# Patient Record
Sex: Male | Born: 1940 | Race: White | Hispanic: No | Marital: Married | Smoking: Current every day smoker
Health system: Southern US, Community
[De-identification: ages and names within clinical notes are randomized; demographics above are authoritative.]

## PROBLEM LIST (undated history)

## (undated) DIAGNOSIS — C679 Malignant neoplasm of bladder, unspecified: Secondary | ICD-10-CM

## (undated) DIAGNOSIS — T4145XA Adverse effect of unspecified anesthetic, initial encounter: Secondary | ICD-10-CM

## (undated) DIAGNOSIS — J189 Pneumonia, unspecified organism: Secondary | ICD-10-CM

## (undated) DIAGNOSIS — T8859XA Other complications of anesthesia, initial encounter: Secondary | ICD-10-CM

## (undated) DIAGNOSIS — I10 Essential (primary) hypertension: Secondary | ICD-10-CM

## (undated) DIAGNOSIS — J449 Chronic obstructive pulmonary disease, unspecified: Secondary | ICD-10-CM

## (undated) DIAGNOSIS — Z9889 Other specified postprocedural states: Secondary | ICD-10-CM

## (undated) DIAGNOSIS — I2510 Atherosclerotic heart disease of native coronary artery without angina pectoris: Secondary | ICD-10-CM

## (undated) DIAGNOSIS — E119 Type 2 diabetes mellitus without complications: Secondary | ICD-10-CM

## (undated) DIAGNOSIS — M169 Osteoarthritis of hip, unspecified: Secondary | ICD-10-CM

## (undated) DIAGNOSIS — M1990 Unspecified osteoarthritis, unspecified site: Secondary | ICD-10-CM

## (undated) DIAGNOSIS — Y92009 Unspecified place in unspecified non-institutional (private) residence as the place of occurrence of the external cause: Secondary | ICD-10-CM

## (undated) DIAGNOSIS — R011 Cardiac murmur, unspecified: Secondary | ICD-10-CM

## (undated) DIAGNOSIS — D649 Anemia, unspecified: Secondary | ICD-10-CM

## (undated) DIAGNOSIS — I509 Heart failure, unspecified: Secondary | ICD-10-CM

## (undated) DIAGNOSIS — I739 Peripheral vascular disease, unspecified: Secondary | ICD-10-CM

## (undated) DIAGNOSIS — R112 Nausea with vomiting, unspecified: Secondary | ICD-10-CM

## (undated) DIAGNOSIS — E785 Hyperlipidemia, unspecified: Secondary | ICD-10-CM

## (undated) DIAGNOSIS — R0602 Shortness of breath: Secondary | ICD-10-CM

## (undated) DIAGNOSIS — C4490 Unspecified malignant neoplasm of skin, unspecified: Secondary | ICD-10-CM

## (undated) DIAGNOSIS — W19XXXA Unspecified fall, initial encounter: Secondary | ICD-10-CM

## (undated) DIAGNOSIS — Z5189 Encounter for other specified aftercare: Secondary | ICD-10-CM

## (undated) DIAGNOSIS — H269 Unspecified cataract: Secondary | ICD-10-CM

## (undated) DIAGNOSIS — I6529 Occlusion and stenosis of unspecified carotid artery: Secondary | ICD-10-CM

## (undated) DIAGNOSIS — I4891 Unspecified atrial fibrillation: Secondary | ICD-10-CM

## (undated) DIAGNOSIS — K922 Gastrointestinal hemorrhage, unspecified: Secondary | ICD-10-CM

## (undated) DIAGNOSIS — C649 Malignant neoplasm of unspecified kidney, except renal pelvis: Secondary | ICD-10-CM

## (undated) HISTORY — DX: Peripheral vascular disease, unspecified: I73.9

## (undated) HISTORY — DX: Malignant neoplasm of bladder, unspecified: C67.9

## (undated) HISTORY — PX: BYPASS GRAFT: SHX909

## (undated) HISTORY — DX: Osteoarthritis of hip, unspecified: M16.9

## (undated) HISTORY — DX: Unspecified malignant neoplasm of skin, unspecified: C44.90

## (undated) HISTORY — DX: Malignant neoplasm of unspecified kidney, except renal pelvis: C64.9

## (undated) HISTORY — PX: CORONARY ARTERY BYPASS GRAFT: SHX141

## (undated) HISTORY — DX: Gastrointestinal hemorrhage, unspecified: K92.2

## (undated) HISTORY — PX: TONSILLECTOMY: SUR1361

## (undated) HISTORY — PX: EYE SURGERY: SHX253

## (undated) HISTORY — DX: Unspecified atrial fibrillation: I48.91

## (undated) HISTORY — DX: Unspecified fall, initial encounter: W19.XXXA

## (undated) HISTORY — DX: Occlusion and stenosis of unspecified carotid artery: I65.29

## (undated) HISTORY — DX: Hyperlipidemia, unspecified: E78.5

## (undated) HISTORY — DX: Essential (primary) hypertension: I10

## (undated) HISTORY — DX: Chronic obstructive pulmonary disease, unspecified: J44.9

## (undated) HISTORY — DX: Unspecified place in unspecified non-institutional (private) residence as the place of occurrence of the external cause: Y92.009

## (undated) HISTORY — PX: JOINT REPLACEMENT: SHX530

## (undated) HISTORY — DX: Atherosclerotic heart disease of native coronary artery without angina pectoris: I25.10

---

## 2004-09-19 ENCOUNTER — Ambulatory Visit: Payer: Self-pay

## 2005-03-26 ENCOUNTER — Ambulatory Visit: Payer: Self-pay | Admitting: Gastroenterology

## 2007-11-09 ENCOUNTER — Ambulatory Visit: Payer: Self-pay | Admitting: Internal Medicine

## 2007-11-11 HISTORY — PX: CARDIAC CATHETERIZATION: SHX172

## 2007-12-17 ENCOUNTER — Ambulatory Visit: Payer: Self-pay | Admitting: Internal Medicine

## 2008-01-04 ENCOUNTER — Inpatient Hospital Stay (HOSPITAL_COMMUNITY): Admission: AD | Admit: 2008-01-04 | Discharge: 2008-01-10 | Payer: Self-pay | Admitting: Cardiovascular Disease

## 2008-01-04 ENCOUNTER — Ambulatory Visit: Payer: Self-pay | Admitting: Critical Care Medicine

## 2008-01-07 ENCOUNTER — Ambulatory Visit: Payer: Self-pay | Admitting: Gastroenterology

## 2008-01-18 ENCOUNTER — Telehealth (INDEPENDENT_AMBULATORY_CARE_PROVIDER_SITE_OTHER): Payer: Self-pay | Admitting: *Deleted

## 2008-02-11 DIAGNOSIS — I739 Peripheral vascular disease, unspecified: Secondary | ICD-10-CM | POA: Insufficient documentation

## 2008-02-11 DIAGNOSIS — E785 Hyperlipidemia, unspecified: Secondary | ICD-10-CM

## 2008-02-11 DIAGNOSIS — J4489 Other specified chronic obstructive pulmonary disease: Secondary | ICD-10-CM | POA: Insufficient documentation

## 2008-02-11 DIAGNOSIS — I4891 Unspecified atrial fibrillation: Secondary | ICD-10-CM | POA: Insufficient documentation

## 2008-02-11 DIAGNOSIS — I2510 Atherosclerotic heart disease of native coronary artery without angina pectoris: Secondary | ICD-10-CM

## 2008-02-11 DIAGNOSIS — J449 Chronic obstructive pulmonary disease, unspecified: Secondary | ICD-10-CM | POA: Insufficient documentation

## 2008-02-11 DIAGNOSIS — I10 Essential (primary) hypertension: Secondary | ICD-10-CM | POA: Insufficient documentation

## 2008-02-11 DIAGNOSIS — D649 Anemia, unspecified: Secondary | ICD-10-CM

## 2008-02-14 ENCOUNTER — Ambulatory Visit: Payer: Self-pay | Admitting: Critical Care Medicine

## 2008-02-16 ENCOUNTER — Ambulatory Visit: Payer: Self-pay | Admitting: Gastroenterology

## 2008-02-17 ENCOUNTER — Ambulatory Visit (HOSPITAL_COMMUNITY): Admission: RE | Admit: 2008-02-17 | Discharge: 2008-02-17 | Payer: Self-pay | Admitting: Gastroenterology

## 2008-02-17 ENCOUNTER — Encounter: Payer: Self-pay | Admitting: Gastroenterology

## 2008-02-22 ENCOUNTER — Telehealth (INDEPENDENT_AMBULATORY_CARE_PROVIDER_SITE_OTHER): Payer: Self-pay | Admitting: *Deleted

## 2008-02-22 ENCOUNTER — Encounter: Payer: Self-pay | Admitting: Critical Care Medicine

## 2008-02-24 ENCOUNTER — Encounter: Payer: Self-pay | Admitting: Critical Care Medicine

## 2008-02-24 ENCOUNTER — Ambulatory Visit: Payer: Self-pay | Admitting: Gastroenterology

## 2008-03-13 ENCOUNTER — Telehealth (INDEPENDENT_AMBULATORY_CARE_PROVIDER_SITE_OTHER): Payer: Self-pay | Admitting: *Deleted

## 2008-03-13 ENCOUNTER — Ambulatory Visit (HOSPITAL_COMMUNITY): Admission: RE | Admit: 2008-03-13 | Discharge: 2008-03-13 | Payer: Self-pay | Admitting: Cardiovascular Disease

## 2008-03-14 ENCOUNTER — Ambulatory Visit: Payer: Self-pay | Admitting: Critical Care Medicine

## 2008-03-14 DIAGNOSIS — J309 Allergic rhinitis, unspecified: Secondary | ICD-10-CM | POA: Insufficient documentation

## 2008-03-24 ENCOUNTER — Telehealth (INDEPENDENT_AMBULATORY_CARE_PROVIDER_SITE_OTHER): Payer: Self-pay | Admitting: *Deleted

## 2008-03-30 ENCOUNTER — Ambulatory Visit: Payer: Self-pay | Admitting: Gastroenterology

## 2008-04-05 ENCOUNTER — Encounter: Payer: Self-pay | Admitting: Gastroenterology

## 2008-04-05 ENCOUNTER — Ambulatory Visit: Payer: Self-pay | Admitting: Gastroenterology

## 2008-04-05 LAB — CONVERTED CEMR LAB
INR: 1.1 — ABNORMAL HIGH (ref 0.8–1.0)
Prothrombin Time: 13.1 s (ref 10.9–13.3)

## 2008-04-07 ENCOUNTER — Encounter: Payer: Self-pay | Admitting: Gastroenterology

## 2008-04-21 ENCOUNTER — Ambulatory Visit: Payer: Self-pay | Admitting: Internal Medicine

## 2008-05-01 LAB — CONVERTED CEMR LAB
OCCULT 1: NEGATIVE
OCCULT 2: NEGATIVE
OCCULT 3: NEGATIVE

## 2008-05-29 ENCOUNTER — Ambulatory Visit: Payer: Self-pay | Admitting: Critical Care Medicine

## 2008-07-18 ENCOUNTER — Telehealth: Payer: Self-pay | Admitting: Critical Care Medicine

## 2008-07-25 ENCOUNTER — Encounter: Payer: Self-pay | Admitting: Critical Care Medicine

## 2008-09-19 ENCOUNTER — Ambulatory Visit: Payer: Self-pay | Admitting: Critical Care Medicine

## 2008-10-12 ENCOUNTER — Ambulatory Visit: Payer: Self-pay | Admitting: Internal Medicine

## 2008-10-12 ENCOUNTER — Telehealth (INDEPENDENT_AMBULATORY_CARE_PROVIDER_SITE_OTHER): Payer: Self-pay | Admitting: *Deleted

## 2008-10-17 ENCOUNTER — Ambulatory Visit: Payer: Self-pay | Admitting: Pulmonary Disease

## 2009-02-08 ENCOUNTER — Ambulatory Visit: Payer: Self-pay | Admitting: Internal Medicine

## 2009-02-20 ENCOUNTER — Ambulatory Visit: Payer: Self-pay | Admitting: Critical Care Medicine

## 2009-03-05 ENCOUNTER — Telehealth: Payer: Self-pay | Admitting: Critical Care Medicine

## 2009-03-12 ENCOUNTER — Ambulatory Visit: Payer: Self-pay | Admitting: Internal Medicine

## 2009-03-20 ENCOUNTER — Ambulatory Visit: Payer: Self-pay | Admitting: Critical Care Medicine

## 2009-03-30 ENCOUNTER — Ambulatory Visit: Payer: Self-pay | Admitting: Critical Care Medicine

## 2009-04-11 ENCOUNTER — Telehealth (INDEPENDENT_AMBULATORY_CARE_PROVIDER_SITE_OTHER): Payer: Self-pay | Admitting: *Deleted

## 2009-05-16 ENCOUNTER — Ambulatory Visit: Payer: Self-pay | Admitting: Critical Care Medicine

## 2009-07-10 ENCOUNTER — Telehealth: Payer: Self-pay | Admitting: Critical Care Medicine

## 2009-07-11 ENCOUNTER — Ambulatory Visit: Payer: Self-pay | Admitting: Critical Care Medicine

## 2009-07-17 ENCOUNTER — Ambulatory Visit: Payer: Self-pay | Admitting: Critical Care Medicine

## 2009-10-18 ENCOUNTER — Ambulatory Visit: Payer: Self-pay | Admitting: Critical Care Medicine

## 2009-10-31 ENCOUNTER — Ambulatory Visit: Payer: Self-pay | Admitting: Critical Care Medicine

## 2009-11-12 ENCOUNTER — Ambulatory Visit: Payer: Self-pay | Admitting: Critical Care Medicine

## 2010-01-10 ENCOUNTER — Telehealth (INDEPENDENT_AMBULATORY_CARE_PROVIDER_SITE_OTHER): Payer: Self-pay | Admitting: *Deleted

## 2010-01-10 ENCOUNTER — Ambulatory Visit: Payer: Self-pay | Admitting: Critical Care Medicine

## 2010-01-29 ENCOUNTER — Encounter: Payer: Self-pay | Admitting: Critical Care Medicine

## 2010-02-12 ENCOUNTER — Telehealth: Payer: Self-pay | Admitting: Critical Care Medicine

## 2010-02-13 ENCOUNTER — Ambulatory Visit: Payer: Self-pay | Admitting: Critical Care Medicine

## 2010-02-28 ENCOUNTER — Ambulatory Visit: Payer: Self-pay | Admitting: Critical Care Medicine

## 2010-03-12 ENCOUNTER — Encounter: Payer: Self-pay | Admitting: Critical Care Medicine

## 2010-04-05 ENCOUNTER — Ambulatory Visit: Payer: Self-pay | Admitting: Critical Care Medicine

## 2010-07-10 ENCOUNTER — Ambulatory Visit: Payer: Self-pay | Admitting: Critical Care Medicine

## 2010-10-11 ENCOUNTER — Ambulatory Visit: Payer: Self-pay | Admitting: Critical Care Medicine

## 2010-11-22 ENCOUNTER — Ambulatory Visit
Admission: RE | Admit: 2010-11-22 | Discharge: 2010-11-22 | Payer: Self-pay | Source: Home / Self Care | Attending: Critical Care Medicine | Admitting: Critical Care Medicine

## 2010-12-10 ENCOUNTER — Ambulatory Visit: Admit: 2010-12-10 | Payer: Self-pay | Admitting: Critical Care Medicine

## 2010-12-10 NOTE — Assessment & Plan Note (Signed)
Summary: Acute NP office visit - COPD   Primary Provider/Referring Provider:  Dr. Alonna Buckler  CC:  prod cough with clear mucus, wheezing, DOE, and runny nose x3-4days - denies f/c/s.  History of Present Illness: 70   yowm  with COPD/AB   still smoking occasionally.  Aug 31: No new issues, not smoking.  Pt denies any significant sore throat, nasal congestion or excess secretions, fever, chills, sweats, unintended weight loss, pleurtic or exertional chest pain, orthopnea PND, or leg swelling Pt denies any increase in rescue therapy over baseline, denies waking up needing it or having any early am or nocturnal exacerbations of coughing/wheezing/or dyspnea.   October 11, 2010 --Presents for an acute office visit. Complains of cough with clear mucus, wheezing, DOE, runny nose x3-4days,. OTC not helping Denies chest pain,  orthopnea, hemoptysis, fever, n/v/d, edema, headache,  Medications Prior to Update: 1)  Spiriva Handihaler 18 Mcg  Caps (Tiotropium Bromide Monohydrate) .... Inhale Contents of 1 Capsule Once A Day 2)  Coumadin 1 Mg  Tabs (Warfarin Sodium) .... Take 1 Tablet By Mouth Once A Day 3)  Simvastatin 20 Mg Tabs (Simvastatin) .... 1/2 By Mouth Daily At Bedtime 4)  Omeprazole 20 Mg Cpdr (Omeprazole) .... 2 By Mouth 30 Minutes Before The First Meal of The Day 5)  Allopurinol 300 Mg  Tabs (Allopurinol) .... 1/2 Tab By Mouth Daily 6)  Amiodarone Hcl 200 Mg Tabs (Amiodarone Hcl) .... 1/2 Tab By Mouth Once Daily 7)  Furosemide 40 Mg  Tabs (Furosemide) .... Take 1/2 Tablet By Mouth  Once Daily 8)  Cozaar 50 Mg Tabs (Losartan Potassium) .... Take 1 Tablet By Mouth Once A Day 9)  Diltiazem Hcl Cr 120 Mg Xr24h-Cap (Diltiazem Hcl) .... Take 1 Capsule By Mouth Once A Day 10)  Adult Aspirin Ec Low Strength 81 Mg  Tbec (Aspirin) .... Take 1 Tab By Mouth At Bedtime 11)  Feosol 45 Mg Tabs (Iron) .... Take 1 Tablet By Mouth Twice A Day 12)  Bystolic 5 Mg  Tabs (Nebivolol Hcl) .... One Tablet  Daily 13)  Albuterol Sulfate (2.5 Mg/18ml) 0.083% Nebu (Albuterol Sulfate) .... Four Times Daily or Every 6 Hours As Needed 14)  Oxygen .... 2.5 L At Bedtime and 3l When Walking  Current Medications (verified): 1)  Spiriva Handihaler 18 Mcg  Caps (Tiotropium Bromide Monohydrate) .... Inhale Contents of 1 Capsule Once A Day 2)  Coumadin 1 Mg  Tabs (Warfarin Sodium) .... Take 1 Tablet By Mouth Once A Day 3)  Simvastatin 20 Mg Tabs (Simvastatin) .... 1/2 By Mouth Daily At Bedtime 4)  Omeprazole 20 Mg Cpdr (Omeprazole) .... 2 By Mouth 30 Minutes Before The First Meal of The Day 5)  Allopurinol 300 Mg  Tabs (Allopurinol) .... 1/2 Tab By Mouth Daily 6)  Amiodarone Hcl 200 Mg Tabs (Amiodarone Hcl) .... 1/2 Tab By Mouth Once Daily 7)  Furosemide 40 Mg  Tabs (Furosemide) .... Take 1/2 Tablet By Mouth  Once Daily 8)  Cozaar 50 Mg Tabs (Losartan Potassium) .... Take 1 Tablet By Mouth Once A Day 9)  Diltiazem Hcl Cr 120 Mg Xr24h-Cap (Diltiazem Hcl) .... Take 1 Capsule By Mouth Once A Day 10)  Adult Aspirin Ec Low Strength 81 Mg  Tbec (Aspirin) .... Take 1 Tab By Mouth At Bedtime 11)  Feosol 45 Mg Tabs (Iron) .... Take 1 Tablet By Mouth Twice A Day 12)  Bystolic 5 Mg  Tabs (Nebivolol Hcl) .... One Tablet Daily 13)  Albuterol  Sulfate (2.5 Mg/55ml) 0.083% Nebu (Albuterol Sulfate) .... Four Times Daily or Every 6 Hours As Needed 14)  Oxygen .... 2.5 L At Bedtime and 3l When Walking 15)  Asmanex 60 Metered Doses 220 Mcg/inh Aepb (Mometasone Furoate) .... Inhale 1 Puff Two Times A Day  Allergies (verified): No Known Drug Allergies  Past History:  Past Medical History: Last updated: 05/29/2008 Atrial Fibrillation  converted to NSR 5/09 on pacerone Coronary Heart Disease Hyperlipidemia Hypertension COPD PVD  Family History: Last updated: 10/18/2009 emphysema - father  heart disease - father cancer - mother (unknown) TB - PGF  Social History: Last updated: 10/18/2009 Active smoker - 4-12cigs a  day drinks 2 beers daily married 3 children retired - worked in a print shop x30yrs  Risk Factors: Smoking Status: current (07/10/2010) Packs/Day: <0.25 (07/10/2010)   Review of Systems      See HPI  Vital Signs:  Patient profile:   70 year old male Height:      64 inches Weight:      154.50 pounds BMI:     26.62 O2 Sat:      92 % on Room air Temp:     98.6 degrees F oral Pulse rate:   72 / minute BP sitting:   104 / 64  (left arm) Cuff size:   regular  Vitals Entered By: Boone Master CNA/MA (October 11, 2010 2:41 PM)  O2 Flow:  Room air CC: prod cough with clear mucus, wheezing, DOE, runny nose x3-4days - denies f/c/s Is Patient Diabetic? No Comments Medications reviewed with patient Daytime contact number verified with patient. Boone Master CNA/MA  October 11, 2010 2:38 PM    Physical Exam  Additional Exam:  GEN: A/Ox3; pleasant , NAD HEENT:  [place]/AT, , EACs-clear, TMs-wnl, NOSE-clear, THROAT-clear NECK:  Supple w/ fair ROM; no JVD; normal carotid impulses w/o bruits; no thyromegaly or nodules palpated; no lymphadenopathy. RESP  Coarse BS , diminshed in bases.,  CARD:  RRR, no m/r/g   GI:   Soft & nt; nml bowel sounds; no organomegaly or masses detected. Musco: Warm bil,  no calf tenderness edema, clubbing, pulses intact Neuro: intact w/ no focal deficits noted.    Impression & Recommendations:  Problem # 1:  CHRONIC OBSTRUCTIVE PULMONARY DISEASE, SEVERE (ICD-496)  Flare :   Omnicef 300mg  two times a day for 7 days Mucinex DM two times a day as needed cough /congestion Prednisone taper over next week Please contact office for sooner follow up if symptoms do not improve or worsen  follow up Dr. Delford Field in 6-8 weeks     Orders: Est. Patient Level IV (47829)  Medications Added to Medication List This Visit: 1)  Asmanex 60 Metered Doses 220 Mcg/inh Aepb (Mometasone furoate) .... Inhale 1 puff two times a day 2)  Cefdinir 300 Mg Caps (Cefdinir) .Marland Kitchen.. 1  by mouth two times a day 3)  Prednisone 10 Mg Tabs (Prednisone) .... 4 tabs for 2 days, then 3 tabs for 2 days, 2 tabs for 2 days, then 1 tab for 2 days, then stop  Patient Instructions: 1)  Omnicef 300mg  two times a day for 7 days 2)  Mucinex DM two times a day as needed cough /congestion 3)  Prednisone taper over next week 4)  Please contact office for sooner follow up if symptoms do not improve or worsen  5)  follow up Dr. Delford Field in 6-8 weeks  6)  WORK ON NOT SMOKING, THAT WOULD BE A  GREAT NEW YEARS RESOLUTION Prescriptions: PREDNISONE 10 MG TABS (PREDNISONE) 4 tabs for 2 days, then 3 tabs for 2 days, 2 tabs for 2 days, then 1 tab for 2 days, then stop  #20 x 0   Entered and Authorized by:   Rubye Oaks NP   Signed by:   Tammy Parrett NP on 10/11/2010   Method used:   Electronically to        [facility]  [address] [phone].* (retail)       [address]       [place], [place]  [address]       Ph: [phone] or [phone]       Fax: [phone]   RxID:   [id number] CEFDINIR 300 MG CAPS (CEFDINIR) 1 by mouth two times a day  #14 x 0   Entered and Authorized by:   Rubye Oaks NP   Signed by:   Tammy Parrett NP on 10/11/2010   Method used:   Electronically to        [facility]  [address] [phone].* (retail)       [address]       [place], [place]  [address]       Ph: [phone] or [phone]       Fax: [phone]   RxID:   [id number]    Immunization History:  Influenza Immunization History:    Influenza:  historical (08/10/2010)

## 2010-12-10 NOTE — Progress Notes (Signed)
Summary: speak to nurse  Phone Note Call from Patient Call back at Home Phone [phone]   Caller: Patient Call For: Avan Gullett Summary of Call: Pt says he has a cold and PW told him to call when this happens so he can take care of it, please advise. Initial call taken by: Darletta Moll,  February 12, 2010 10:04 AM  Follow-up for Phone Call        called and spoke with pt.  pt c/o "difficulty breathing" increased sob with exertion and at rest, feeling weak and tired, head congestion, coughing up "milky colored sputum,"  Pt denied fever or sore throat.  Symptoms started approx 3 to 4 days ago.  Please advise.  Thank you. Arman Filter LPN  February 13, 980 11:38 AM  [facility] [place]  Additional Follow-up for Phone Call Additional follow up Details #1::        Pt will need OV asap Additional Follow-up by: Storm Frisk MD,  February 12, 2010 1:39 PM    Additional Follow-up for Phone Call Additional follow up Details #2::    pt wanted to wait till tomorrow, so he was schedueld to see PW 02-13-10 at 3:20. Carron Curie CMA  February 12, 2010 2:53 PM

## 2010-12-10 NOTE — Assessment & Plan Note (Signed)
Summary: NP follow up - COPD   Primary Provider/Referring Provider:  Dr. Alonna Buckler  CC:  2 week follow up - pt states he doing "great" and has no complaints today.Marland Kitchen  History of Present Illness: 62  yowm  with COPD/AB   still smoking occasionally.  July 17, 2009 --Last visit flare -- getting hoarse and cough np.  rx prednisone and this helped The mucous is clear, not discolored.The dyspnea and cough are now better.  There is no chest pain. The pt is still smoking 1-3 cigs daily.this pt continues to not take his spiriva/asmanex program regularly  October 18, 2009--Presents for an acute office visit. Complains of prod cough with thick milk-colored mucus, head congestion, PND x2days.  Nasal congestion and drainage white/clear. Denies chest pain,  orthopnea, hemoptysis, fever, n/v/d, edema, headache,recent travel or antibiotics. No otc used. Continues on nocturnal O2. No fluid retention.   October 31, 2009 12:05 PM saw NP 12/9  for AB flare and rx doxycycline now: the ABX helped at the last ov. Still occ smokes  Now is coughing mucous but is clear.  Some dyspnea but at baseline.  November 12, 2009--Presents for an acute office visit. Complains that his cough/congestion returned for last 3 days. Now he is coughing and wheeizng. No smoking x 2 weeks. No otc used. Denies chest pain, dyspnea, orthopnea, hemoptysis, fever, n/v/d, edema, headache. No abd pain or urinary symptoms.   January 10, 2010 --Presents for an acute office visit. Pt c/o lethargic, loss of appetite, wheezing, productive cough with "milky" mucus since the weekend.  Has had 1 week of cough and congestion. Still smoking. Denies chest pain, dyspnea, orthopnea, hemoptysis, fever, n/v/d, edema, headache. Did not take abx last visit, was tx w/ steroid taper 11/2009. February 13, 2010 -the pt notes more cough and wheezing past one week.  No f/c/s.  Pt denies chest pain.  Pt with more dyspnea and excess mucous Pt denies  smoking.  02/28/10--Presents for 2 week follow up - pt states he doing "great" and has no complaints today. Last visit w/ COPD flare , tx w/ Suprax and steroid taper.  He is feeling much better. Cough /congesiton is improved. Denies chest pain, dyspnea, orthopnea, hemoptysis, fever, n/v/d, edema, headache.    Medications Prior to Update: 1)  Spiriva Handihaler 18 Mcg  Caps (Tiotropium Bromide Monohydrate) .... Inhale Contents of 1 Capsule Once A Day 2)  Asmanex 60 Metered Doses 220 Mcg/inh Aepb (Mometasone Furoate) .... 2 Puffs By Mouth Once Daily 3)  Coumadin 1 Mg  Tabs (Warfarin Sodium) .... Take 1 Tablet By Mouth Once A Day 4)  Simvastatin 20 Mg Tabs (Simvastatin) .... 1/2 By Mouth Daily At Bedtime 5)  Omeprazole 20 Mg Cpdr (Omeprazole) .... 2 By Mouth 30 Minutes Before The First Meal of The Day 6)  Allopurinol 300 Mg  Tabs (Allopurinol) .... 1/2 Tab By Mouth Daily 7)  Amiodarone Hcl 200 Mg Tabs (Amiodarone Hcl) .... 1/2 Tab By Mouth Once Daily 8)  Furosemide 40 Mg  Tabs (Furosemide) .... Take 1/2 Tablet By Mouth  Once Daily 9)  Cozaar 50 Mg Tabs (Losartan Potassium) .... Take 1 Tablet By Mouth Once A Day 10)  Diltiazem Hcl Cr 120 Mg Xr24h-Cap (Diltiazem Hcl) .... Take 1 Capsule By Mouth Once A Day 11)  Adult Aspirin Ec Low Strength 81 Mg  Tbec (Aspirin) .... Take 1 Tab By Mouth At Bedtime 12)  Feosol 45 Mg Tabs (Iron) .... Take 1  Tablet By Mouth Twice A Day 13)  Bystolic 5 Mg  Tabs (Nebivolol Hcl) .... One Tablet Daily 14)  Albuterol Sulfate (2.5 Mg/12ml) 0.083%  Nebu (Albuterol Sulfate) .... Four Times Daily or Every 6 Hours As Needed 15)  Oxygen .... 2.5 L At Bedtime and As Needed Daytime With Exertion 3l  Current Medications (verified): 1)  Spiriva Handihaler 18 Mcg  Caps (Tiotropium Bromide Monohydrate) .... Inhale Contents of 1 Capsule Once A Day 2)  Asmanex 60 Metered Doses 220 Mcg/inh Aepb (Mometasone Furoate) .... 2 Puffs By Mouth Once Daily 3)  Coumadin 1 Mg  Tabs (Warfarin  Sodium) .... Take 1 Tablet By Mouth Once A Day 4)  Simvastatin 20 Mg Tabs (Simvastatin) .... 1/2 By Mouth Daily At Bedtime 5)  Omeprazole 20 Mg Cpdr (Omeprazole) .... 2 By Mouth 30 Minutes Before The First Meal of The Day 6)  Allopurinol 300 Mg  Tabs (Allopurinol) .... 1/2 Tab By Mouth Daily 7)  Amiodarone Hcl 200 Mg Tabs (Amiodarone Hcl) .... 1/2 Tab By Mouth Once Daily 8)  Furosemide 40 Mg  Tabs (Furosemide) .... Take 1/2 Tablet By Mouth  Once Daily 9)  Cozaar 50 Mg Tabs (Losartan Potassium) .... Take 1 Tablet By Mouth Once A Day 10)  Diltiazem Hcl Cr 120 Mg Xr24h-Cap (Diltiazem Hcl) .... Take 1 Capsule By Mouth Once A Day 11)  Adult Aspirin Ec Low Strength 81 Mg  Tbec (Aspirin) .... Take 1 Tab By Mouth At Bedtime 12)  Feosol 45 Mg Tabs (Iron) .... Take 1 Tablet By Mouth Twice A Day 13)  Bystolic 5 Mg  Tabs (Nebivolol Hcl) .... One Tablet Daily 14)  Albuterol Sulfate (2.5 Mg/21ml) 0.083%  Nebu (Albuterol Sulfate) .... Four Times Daily or Every 6 Hours As Needed 15)  Oxygen .... 2.5 L At Bedtime and As Needed Daytime With Exertion 3l  Allergies (verified): No Known Drug Allergies  Past History:  Past Medical History: Last updated: 05/29/2008 Atrial Fibrillation  converted to NSR 5/09 on pacerone Coronary Heart Disease Hyperlipidemia Hypertension COPD PVD  Family History: Last updated: 10/18/2009 emphysema - father  heart disease - father cancer - mother (unknown) TB - PGF  Social History: Last updated: 10/18/2009 Active smoker - 4-12cigs a day drinks 2 beers daily married 3 children retired - worked in a print shop x21yrs  Risk Factors: Alcohol Use: <1 (03/12/2009)  Risk Factors: Smoking Status: quit < 6 months (02/13/2010) Packs/Day: <0.25 (10/31/2009)  Review of Systems      See HPI  Vital Signs:  Patient profile:   70 year old male Height:      66 inches Weight:      159 pounds BMI:     25.76 O2 Sat:      95 % on Room air Temp:     97.7 degrees F  oral Pulse rate:   68 / minute BP sitting:   110 / 64  (left arm) Cuff size:   regular  Vitals Entered By: Boone Master CNA (February 28, 2010 3:29 PM)  O2 Flow:  Room air CC: 2 week follow up - pt states he doing "great" and has no complaints today. Is Patient Diabetic? No Comments Medications reviewed with patient Daytime contact number verified with patient. Boone Master CNA  February 28, 2010 3:30 PM    Physical Exam  Additional Exam:  GEN: A/Ox3; pleasant , NAD HEENT:  [place]/AT, , EACs-clear, TMs-wnl, NOSE-clear, THROAT-clear NECK:  Supple w/ fair ROM; no JVD; normal carotid impulses w/o  bruits; no thyromegaly or nodules palpated; no lymphadenopathy. RESP  Coarse BS , diminshed in bases.  CARD:  RRR, no m/r/g   GI:   Soft & nt; nml bowel sounds; no organomegaly or masses detected. Musco: Warm bil,  no calf tenderness edema, clubbing, pulses intact Neuro: intact w/ no focal deficits noted.    Impression & Recommendations:  Problem # 1:  CHRONIC OBSTRUCTIVE PULMONARY DISEASE, SEVERE (ICD-496) Recent flare now resolved.  REC:  Continue on same meds.  follow up.Dr. Delford Field in 2-3 months  Please contact office for sooner follow up if symptoms do not improve or worsen   Complete Medication List: 1)  Spiriva Handihaler 18 Mcg Caps (Tiotropium bromide monohydrate) .... Inhale contents of 1 capsule once a day 2)  Asmanex 60 Metered Doses 220 Mcg/inh Aepb (Mometasone furoate) .... 2 puffs by mouth once daily 3)  Coumadin 1 Mg Tabs (Warfarin sodium) .... Take 1 tablet by mouth once a day 4)  Simvastatin 20 Mg Tabs (Simvastatin) .... 1/2 by mouth daily at bedtime 5)  Omeprazole 20 Mg Cpdr (Omeprazole) .... 2 by mouth 30 minutes before the first meal of the day 6)  Allopurinol 300 Mg Tabs (Allopurinol) .... 1/2 tab by mouth daily 7)  Amiodarone Hcl 200 Mg Tabs (Amiodarone hcl) .... 1/2 tab by mouth once daily 8)  Furosemide 40 Mg Tabs (Furosemide) .... Take 1/2 tablet by mouth  once  daily 9)  Cozaar 50 Mg Tabs (Losartan potassium) .... Take 1 tablet by mouth once a day 10)  Diltiazem Hcl Cr 120 Mg Xr24h-cap (Diltiazem hcl) .... Take 1 capsule by mouth once a day 11)  Adult Aspirin Ec Low Strength 81 Mg Tbec (Aspirin) .... Take 1 tab by mouth at bedtime 12)  Feosol 45 Mg Tabs (Iron) .... Take 1 tablet by mouth twice a day 13)  Bystolic 5 Mg Tabs (Nebivolol hcl) .... One tablet daily 14)  Albuterol Sulfate (2.5 Mg/22ml) 0.083% Nebu (Albuterol sulfate) .... Four times daily or every 6 hours as needed 15)  Oxygen  .... 2.5 l at bedtime and as needed daytime with exertion 3l  Other Orders: Est. Patient Level II (16109)  Patient Instructions: 1)  Continue on same meds.  2)  follow up.Dr. Delford Field in 2-3 months  3)  Please contact office for sooner follow up if symptoms do not improve or worsen

## 2010-12-10 NOTE — Assessment & Plan Note (Signed)
Summary: rov/klw   Visit Type:  Sick Visit Primary Provider/Referring Provider:  Dr. Alonna Buckler  CC:  cough , congestion, and decreased appetitie. Jason Larsen  History of Present Illness: 70  yowm  with COPD/AB   still smoking occasionally.  July 17, 2009 --Last visit flare -- getting hoarse and cough np.  rx prednisone and this helped The mucous is clear, not discolored.The dyspnea and cough are now better.  There is no chest pain. The pt is still smoking 1-3 cigs daily.this pt continues to not take his spiriva/asmanex program regularly  October 18, 2009--Presents for an acute office visit. Complains of prod cough with thick milk-colored mucus, head congestion, PND x2days.  Nasal congestion and drainage white/clear. Denies chest pain,  orthopnea, hemoptysis, fever, n/v/d, edema, headache,recent travel or antibiotics. No otc used. Continues on nocturnal O2. No fluid retention.   October 31, 2009 12:05 PM saw NP 12/9  for AB flare and rx doxycycline now: the ABX helped at the last ov. Still occ smokes  Now is coughing mucous but is clear.  Some dyspnea but at baseline.  November 12, 2009--Presents for an acute office visit. Complains that his cough/congestion returned for last 3 days. Now he is coughing and wheeizng. No smoking x 2 weeks. No otc used. Denies chest pain, dyspnea, orthopnea, hemoptysis, fever, n/v/d, edema, headache. No abd pain or urinary symptoms.   January 10, 2010 --Presents for an acute office visit. Pt c/o lethargic, loss of appetite, wheezing, productive cough with "milky" mucus since the weekend.  Has had 1 week of cough and congestion. Still smoking. Denies chest pain, dyspnea, orthopnea, hemoptysis, fever, n/v/d, edema, headache. Did not take abx last visit, was tx w/ steroid taper 11/2009.   Current Medications (verified): 1)  Spiriva Handihaler 18 Mcg  Caps (Tiotropium Bromide Monohydrate) .... Inhale Contents of 1 Capsule Once A Day 2)  Asmanex 60 Metered Doses 220  Mcg/inh Aepb (Mometasone Furoate) .... 2 Puffs By Mouth Once Daily 3)  Coumadin 1 Mg  Tabs (Warfarin Sodium) .... Take 1 Tablet By Mouth Once A Day 4)  Simvastatin 20 Mg Tabs (Simvastatin) .... 1/2 By Mouth Daily At Bedtime 5)  Omeprazole 20 Mg Cpdr (Omeprazole) .... 2 By Mouth 30 Minutes Before The First Meal of The Day 6)  Allopurinol 300 Mg  Tabs (Allopurinol) .... 1/2 Tab By Mouth Daily 7)  Amiodarone Hcl 200 Mg Tabs (Amiodarone Hcl) .... 1/2 Tab By Mouth Once Daily 8)  Furosemide 40 Mg  Tabs (Furosemide) .... Take 1/2 Tablet By Mouth  Once Daily 9)  Cozaar 50 Mg Tabs (Losartan Potassium) .... Take 1 Tablet By Mouth Once A Day 10)  Diltiazem Hcl Cr 120 Mg Xr24h-Cap (Diltiazem Hcl) .... Take 1 Capsule By Mouth Once A Day 11)  Adult Aspirin Ec Low Strength 81 Mg  Tbec (Aspirin) .... Take 1 Tab By Mouth At Bedtime 12)  Feosol 45 Mg Tabs (Iron) .... Take 1 Tablet By Mouth Twice A Day 13)  Bystolic 5 Mg  Tabs (Nebivolol Hcl) .... One Tablet Daily 14)  Albuterol Sulfate (2.5 Mg/68ml) 0.083%  Nebu (Albuterol Sulfate) .... Four Times Daily or Every 6 Hours As Needed 15)  Oxygen .... 2.5 L At Bedtime and As Needed Daytime With Exertion 3l  Allergies (verified): No Known Drug Allergies  Past History:  Past Medical History: Last updated: 05/29/2008 Atrial Fibrillation  converted to NSR 5/09 on pacerone Coronary Heart Disease Hyperlipidemia Hypertension COPD PVD  Family History: Last  updated: 10/18/2009 emphysema - father  heart disease - father cancer - mother (unknown) TB - PGF  Social History: Last updated: 10/18/2009 Active smoker - 4-12cigs a day drinks 2 beers daily married 3 children retired - worked in a print shop x80yrs  Risk Factors: Smoking Status: current (10/31/2009) Packs/Day: <0.25 (10/31/2009)  Review of Systems      See HPI       The patient complains of shortness of breath with activity, productive cough, non-productive cough, loss of appetite, and change  in color of mucus.  The patient denies shortness of breath at rest, coughing up blood, chest pain, irregular heartbeats, acid heartburn, indigestion, weight change, abdominal pain, difficulty swallowing, sore throat, tooth/dental problems, headaches, nasal congestion/difficulty breathing through nose, sneezing, itching, ear ache, anxiety, depression, hand/feet swelling, joint stiffness or pain, rash, and fever.    Vital Signs:  Patient profile:   70 year old male Height:      66 inches Weight:      162.13 pounds O2 Sat:      95 % on Room air Temp:     98.6 degrees F oral Pulse rate:   74 / minute BP sitting:   140 / 78  (left arm) Cuff size:   regular  Vitals Entered By: Zackery Barefoot CMA (January 10, 2010 11:51 AM)  O2 Flow:  Room air CC: cough , congestion, decreased appetitie.  Comments Medications reviewed with patient Verified contact number and pharmacy with patient Zackery Barefoot CMA  January 10, 2010 11:53 AM    Physical Exam  Additional Exam:  GEN: A/Ox3; pleasant , NAD HEENT:  [place]/AT, , EACs-clear, TMs-wnl, NOSE-clear, THROAT-clear NECK:  Supple w/ fair ROM; no JVD; normal carotid impulses w/o bruits; no thyromegaly or nodules palpated; no lymphadenopathy. RESP  Coarse BS w/ scattered rhonchi  CARD:  RRR, no m/r/g   GI:   Soft & nt; nml bowel sounds; no organomegaly or masses detected. Musco: Warm bil,  no calf tenderness edema, clubbing, pulses intact Neuro: intact w/ no focal deficits noted.    Impression & Recommendations:  Problem # 1:  CHRONIC OBSTRUCTIVE PULMONARY DISEASE, SEVERE (ICD-496) Recurrent exacerbation  REC:  Avelox 400mg  once daily for 7 days Prednisone taper over next week.  Mucinex DM two times a day as needed cough/congestion  Increae fluids.  Notify coumadin clinic that you are strarting an antibitoic today.   follow up Dr. Delford Field in 6-8 weeks as shceduled  Please contact office for sooner follow up if symptoms do not improve or worsen    Medications Added to Medication List This Visit: 1)  Avelox 400 Mg Tabs (Moxifloxacin hcl) .Jason Larsen.. 1 by mouth once daily 2)  Prednisone 10 Mg Tabs (Prednisone) .... 4 tabs for 2 days, then 3 tabs for 2 days, 2 tabs for 2 days, then 1 tab for 2 days, then stop  Complete Medication List: 1)  Spiriva Handihaler 18 Mcg Caps (Tiotropium bromide monohydrate) .... Inhale contents of 1 capsule once a day 2)  Asmanex 60 Metered Doses 220 Mcg/inh Aepb (Mometasone furoate) .... 2 puffs by mouth once daily 3)  Coumadin 1 Mg Tabs (Warfarin sodium) .... Take 1 tablet by mouth once a day 4)  Simvastatin 20 Mg Tabs (Simvastatin) .... 1/2 by mouth daily at bedtime 5)  Omeprazole 20 Mg Cpdr (Omeprazole) .... 2 by mouth 30 minutes before the first meal of the day 6)  Allopurinol 300 Mg Tabs (Allopurinol) .... 1/2 tab by mouth daily 7)  Amiodarone Hcl 200 Mg Tabs (Amiodarone hcl) .... 1/2 tab by mouth once daily 8)  Furosemide 40 Mg Tabs (Furosemide) .... Take 1/2 tablet by mouth  once daily 9)  Cozaar 50 Mg Tabs (Losartan potassium) .... Take 1 tablet by mouth once a day 10)  Diltiazem Hcl Cr 120 Mg Xr24h-cap (Diltiazem hcl) .... Take 1 capsule by mouth once a day 11)  Adult Aspirin Ec Low Strength 81 Mg Tbec (Aspirin) .... Take 1 tab by mouth at bedtime 12)  Feosol 45 Mg Tabs (Iron) .... Take 1 tablet by mouth twice a day 13)  Bystolic 5 Mg Tabs (Nebivolol hcl) .... One tablet daily 14)  Albuterol Sulfate (2.5 Mg/93ml) 0.083% Nebu (Albuterol sulfate) .... Four times daily or every 6 hours as needed 15)  Oxygen  .... 2.5 l at bedtime and as needed daytime with exertion 3l 16)  Avelox 400 Mg Tabs (Moxifloxacin hcl) .Jason Larsen.. 1 by mouth once daily 17)  Prednisone 10 Mg Tabs (Prednisone) .... 4 tabs for 2 days, then 3 tabs for 2 days, 2 tabs for 2 days, then 1 tab for 2 days, then stop  Other Orders: Est. Patient Level IV (16109)  Patient Instructions: 1)  Avelox 400mg  once daily for 7 days 2)  Prednisone taper  over next week.  3)  Mucinex DM two times a day as needed cough/congestion  4)  Increae fluids.  5)  Notify coumadin clinic that you are strarting an antibitoic today.   6)  follow up Dr. Delford Field in 6-8 weeks as shceduled  7)  Please contact office for sooner follow up if symptoms do not improve or worsen  Prescriptions: PREDNISONE 10 MG TABS (PREDNISONE) 4 tabs for 2 days, then 3 tabs for 2 days, 2 tabs for 2 days, then 1 tab for 2 days, then stop  #20 x 0   Entered and Authorized by:   Rubye Oaks NP   Signed by:   Latoya Maulding NP on 01/10/2010   Method used:   Electronically to        [facility]  [address] [phone] (retail)       [address]       [place], [place]  [address]       Ph: [phone] or [phone]       Fax: [phone]   RxID:   [id number] AVELOX 400 MG TABS (MOXIFLOXACIN HCL) 1 by mouth once daily  #7 x 0   Entered and Authorized by:   Rubye Oaks NP   Signed by:   Dejanae Helser NP on 01/10/2010   Method used:   Electronically to        [facility]  [address] [phone].* (retail)       [address]       [place], [place]  [address]       Ph: [phone] or [phone]       Fax: [phone]   RxID:   [id number]

## 2010-12-10 NOTE — Miscellaneous (Signed)
Summary: Orders Update   Clinical Lists Changes  Orders: Added new Service order of Est. Patient Level IV (99214) - Signed 

## 2010-12-10 NOTE — Assessment & Plan Note (Signed)
Summary: cold,going into lungs/apc   Primary Provider/Referring Provider:  Dr. Alonna Buckler  CC:  Acute Visit.  c/o runny nose with clear drainage, cough with occ clear mucus, wheezing, and and inc SOB with activity.  denies chest tightness/fever.Jason Larsen  History of Present Illness: 70  yowm  with COPD/AB   still smoking occasionally.  July 17, 2009 --Last visit flare -- getting hoarse and cough np.  rx prednisone and this helped The mucous is clear, not discolored.The dyspnea and cough are now better.  There is no chest pain. The pt is still smoking 1-3 cigs daily.this pt continues to not take his spiriva/asmanex program regularly  October 18, 2009--Presents for an acute office visit. Complains of prod cough with thick milk-colored mucus, head congestion, PND x2days.  Nasal congestion and drainage white/clear. Denies chest pain,  orthopnea, hemoptysis, fever, n/v/d, edema, headache,recent travel or antibiotics. No otc used. Continues on nocturnal O2. No fluid retention.   October 31, 2009 12:05 PM saw NP 12/9  for AB flare and rx doxycycline now: the ABX helped at the last ov. Still occ smokes  Now is coughing mucous but is clear.  Some dyspnea but at baseline.  November 12, 2009--Presents for an acute office visit. Complains that his cough/congestion returned for last 3 days. Now he is coughing and wheeizng. No smoking x 2 weeks. No otc used. Denies chest pain, dyspnea, orthopnea, hemoptysis, fever, n/v/d, edema, headache. No abd pain or urinary symptoms.   Current Medications (verified): 1)  Spiriva Handihaler 18 Mcg  Caps (Tiotropium Bromide Monohydrate) .... Inhale Contents of 1 Capsule Once A Day 2)  Asmanex 60 Metered Doses 220 Mcg/inh Aepb (Mometasone Furoate) .... 2 Puffs By Mouth Once Daily 3)  Coumadin 1 Mg  Tabs (Warfarin Sodium) .... Take 1 Tablet By Mouth Once A Day 4)  Simvastatin 20 Mg Tabs (Simvastatin) .... 1/2 By Mouth Daily At Bedtime 5)  Omeprazole 20 Mg Cpdr  (Omeprazole) .... 2 By Mouth 30 Minutes Before The First Meal of The Day 6)  Allopurinol 300 Mg  Tabs (Allopurinol) .... 1/2 Tab By Mouth Daily 7)  Amiodarone Hcl 200 Mg Tabs (Amiodarone Hcl) .... 1/2 Tab By Mouth Once Daily 8)  Furosemide 40 Mg  Tabs (Furosemide) .... Take 1/2 Tablet By Mouth  Once Daily 9)  Cozaar 50 Mg Tabs (Losartan Potassium) .... Take 1 Tablet By Mouth Once A Day 10)  Diltiazem Hcl Cr 120 Mg Xr24h-Cap (Diltiazem Hcl) .... Take 1 Capsule By Mouth Once A Day 11)  Adult Aspirin Ec Low Strength 81 Mg  Tbec (Aspirin) .... Take 1 Tab By Mouth At Bedtime 12)  Feosol 45 Mg Tabs (Iron) .... Take 1 Tablet By Mouth Twice A Day 13)  Bystolic 5 Mg  Tabs (Nebivolol Hcl) .... One Tablet Daily 14)  Albuterol Sulfate (2.5 Mg/36ml) 0.083%  Nebu (Albuterol Sulfate) .... Four Times Daily or Every 6 Hours As Needed 15)  Oxygen .... 2.5 L At Bedtime and As Needed Daytime With Exertion 3l  Allergies (verified): No Known Drug Allergies  Past History:  Past Medical History: Last updated: 05/29/2008 Atrial Fibrillation  converted to NSR 5/09 on pacerone Coronary Heart Disease Hyperlipidemia Hypertension COPD PVD  Family History: Last updated: 10/18/2009 emphysema - father  heart disease - father cancer - mother (unknown) TB - PGF  Social History: Last updated: 10/18/2009 Active smoker - 4-12cigs a day drinks 2 beers daily married 3 children retired - worked in a print shop x101yrs  Risk Factors: Alcohol Use: <1 (03/12/2009)  Risk Factors: Smoking Status: current (10/31/2009) Packs/Day: <0.25 (10/31/2009)  Review of Systems      See HPI  Vital Signs:  Patient profile:   70 year old male Height:      66 inches Weight:      163.25 pounds BMI:     26.44 O2 Sat:      93 % on Room air Temp:     97.1 degrees F oral Pulse rate:   74 / minute BP sitting:   114 / 64  (left arm) Cuff size:   regular  Vitals Entered By: Gweneth Dimitri RN (November 12, 2009 2:50 PM)  O2  Flow:  Room air CC: Acute Visit.  c/o runny nose with clear drainage, cough with occ clear mucus, wheezing, and inc SOB with activity.  denies chest tightness/fever. Is Patient Diabetic? No Comments Medications reviewed with patient phone number verified with pt. Gweneth Dimitri RN  November 12, 2009 2:52 PM    Physical Exam  Additional Exam:  GEN: A/Ox3; pleasant , NAD HEENT:  [place]/AT, , EACs-clear, TMs-wnl, NOSE-clear, THROAT-clear NECK:  Supple w/ fair ROM; no JVD; normal carotid impulses w/o bruits; no thyromegaly or nodules palpated; no lymphadenopathy. RESP  Coarse BS w/ exp wheeizng  CARD:  RRR, no m/r/g   GI:   Soft & nt; nml bowel sounds; no organomegaly or masses detected. Musco: Warm bil,  no calf tenderness edema, clubbing, pulses intact Neuro: intact w/ no focal deficits noted.    Impression & Recommendations:  Problem # 1:  CHRONIC OBSTRUCTIVE PULMONARY DISEASE, SEVERE (ICD-496)  Exacerbation  REC:  Prednisone taper over next week.  Mucinex DM two times a day as needed cough/congestion  Increae fluids.  Augmentin 875mg  two times a day to have on hold if symptoms worsen with discolored mucus or do no resolve.  Please contact office for sooner follow up if symptoms do not improve or worsen  follow up Dr. Delford Field in 6-8 weeks as shceduled   Orders: Est. Patient Level IV (16109)  Medications Added to Medication List This Visit: 1)  Prednisone 10 Mg Tabs (Prednisone) .... 4 tabs for 2 days, then 3 tabs for 2 days, 2 tabs for 2 days, then 1 tab for 2 days, then stop 2)  Augmentin 875-125 Mg Tabs (Amoxicillin-pot clavulanate) .Jason Larsen.. 1 by mouth two times a day  Complete Medication List: 1)  Spiriva Handihaler 18 Mcg Caps (Tiotropium bromide monohydrate) .... Inhale contents of 1 capsule once a day 2)  Asmanex 60 Metered Doses 220 Mcg/inh Aepb (Mometasone furoate) .... 2 puffs by mouth once daily 3)  Coumadin 1 Mg Tabs (Warfarin sodium) .... Take 1 tablet by mouth once a  day 4)  Simvastatin 20 Mg Tabs (Simvastatin) .... 1/2 by mouth daily at bedtime 5)  Omeprazole 20 Mg Cpdr (Omeprazole) .... 2 by mouth 30 minutes before the first meal of the day 6)  Allopurinol 300 Mg Tabs (Allopurinol) .... 1/2 tab by mouth daily 7)  Amiodarone Hcl 200 Mg Tabs (Amiodarone hcl) .... 1/2 tab by mouth once daily 8)  Furosemide 40 Mg Tabs (Furosemide) .... Take 1/2 tablet by mouth  once daily 9)  Cozaar 50 Mg Tabs (Losartan potassium) .... Take 1 tablet by mouth once a day 10)  Diltiazem Hcl Cr 120 Mg Xr24h-cap (Diltiazem hcl) .... Take 1 capsule by mouth once a day 11)  Adult Aspirin Ec Low Strength 81 Mg Tbec (Aspirin) .... Take 1  tab by mouth at bedtime 12)  Feosol 45 Mg Tabs (Iron) .... Take 1 tablet by mouth twice a day 13)  Bystolic 5 Mg Tabs (Nebivolol hcl) .... One tablet daily 14)  Albuterol Sulfate (2.5 Mg/89ml) 0.083% Nebu (Albuterol sulfate) .... Four times daily or every 6 hours as needed 15)  Oxygen  .... 2.5 l at bedtime and as needed daytime with exertion 3l 16)  Prednisone 10 Mg Tabs (Prednisone) .... 4 tabs for 2 days, then 3 tabs for 2 days, 2 tabs for 2 days, then 1 tab for 2 days, then stop 17)  Augmentin 875-125 Mg Tabs (Amoxicillin-pot clavulanate) .Jason Larsen.. 1 by mouth two times a day  Patient Instructions: 1)  Prednisone taper over next week.  2)  Mucinex DM two times a day as needed cough/congestion  3)  Increae fluids.  4)  Augmentin 875mg  two times a day to have on hold if symptoms worsen with discolored mucus or do no resolve.  5)  Please contact office for sooner follow up if symptoms do not improve or worsen  6)  follow up Dr. Delford Field in 6-8 weeks as shceduled  Prescriptions: AUGMENTIN 875-125 MG TABS (AMOXICILLIN-POT CLAVULANATE) 1 by mouth two times a day  #14 x 0   Entered and Authorized by:   Rubye Oaks NP   Signed by:   Tammy Parrett NP on 11/12/2009   Method used:   [facility] to        [facility]  [address] [phone].* (retail)       [address]       [place], [place]  [address]       Ph: [phone] or [phone]       Fax: [phone]   RxID:   [id number] PREDNISONE 10 MG TABS (PREDNISONE) 4 tabs for 2 days, then 3 tabs for 2 days, 2 tabs for 2 days, then 1 tab for 2 days, then stop  #20 x 0   Entered and Authorized by:   Rubye Oaks NP   Signed by:   Tammy Parrett NP on 11/12/2009   Method used:   [facility]        [facility]  [address] [phone][facility] (retail)       [address]       [place], [place]  [address]       Ph: [phone] or [phone]       Fax: [phone]   RxID:   [id number]   Prevention & Chronic Care Immunizations   Influenza vaccine: Fluvax 3+  (08/14/2009)    Tetanus booster: Not documented    Pneumococcal vaccine: Not documented    H. zoster vaccine: Not documented  Colorectal Screening   Hemoccult: Not documented    Colonoscopy: Location:  [facility].    (04/05/2008)   Colonoscopy due: 04/2013  Other Screening   PSA: Not documented   Smoking status: current  (10/31/2009)   Smoking cessation counseling: yes  (02/20/2009)  Lipids   Total Cholesterol: Not documented   LDL: Not documented   LDL Direct: Not documented   HDL: Not documented   Triglycerides: Not documented    SGOT (AST): Not documented   SGPT (ALT): Not documented   Alkaline phosphatase: Not documented   Total bilirubin: Not documented  Hypertension   Last Blood Pressure: 114 / 64  (11/12/2009)   Serum creatinine: Not  documented   Serum potassium Not documented  Self-Management Support :    Hypertension self-management support: Not documented    Lipid self-management support: Not documented     Appended Document: cold,going into lungs/apc noted and agree with plan of care   pw

## 2010-12-10 NOTE — Progress Notes (Signed)
Summary: stomach  Phone Note Call from Patient Call back at Home Phone [phone]   Caller: Patient Call For: tammy p Reason for Call: Talk to Nurse Summary of Call: pt's stomach IS swollen, should he still take the prednisone?  He said that he and TP discussed this at visit today.  Can you please call and discuss this with patient. Initial call taken by: Eugene Gavia,  January 10, 2010 2:11 PM  Follow-up for Phone Call        pt states stomach is fine now he just wanted to know what to do if he started having problems after starting the pred taper--advised pt if he does have problems after starting pred to just give [place] a call  Follow-up by: Philipp Deputy CMA,  January 10, 2010 3:03 PM

## 2010-12-10 NOTE — Assessment & Plan Note (Signed)
Summary: Pulmonary OV   Primary Provider/Referring Provider:  Dr. Alonna Buckler  CC:  Follow up.  Pt states breathing is doing "great."  Pt states he is walking 1 mile/daily and swimming 1-2 x weekly with no problems.  Occ coughing - mainly when tempatures change.  Cough is prod at times with white mucus..  History of Present Illness: 51  yowm  with COPD/AB   still smoking occasionally.  Aug 31: No new issues, not smoking.  Pt denies any significant sore throat, nasal congestion or excess secretions, fever, chills, sweats, unintended weight loss, pleurtic or exertional chest pain, orthopnea PND, or leg swelling Pt denies any increase in rescue therapy over baseline, denies waking up needing it or having any early am or nocturnal exacerbations of coughing/wheezing/or dyspnea.      Preventive Screening-Counseling & Management  Alcohol-Tobacco     Smoking Status: current     Packs/Day: <0.25  Current Medications (verified): 1)  Spiriva Handihaler 18 Mcg  Caps (Tiotropium Bromide Monohydrate) .... Inhale Contents of 1 Capsule Once A Day 2)  Coumadin 1 Mg  Tabs (Warfarin Sodium) .... Take 1 Tablet By Mouth Once A Day 3)  Simvastatin 20 Mg Tabs (Simvastatin) .... 1/2 By Mouth Daily At Bedtime 4)  Omeprazole 20 Mg Cpdr (Omeprazole) .... 2 By Mouth 30 Minutes Before The First Meal of The Day 5)  Allopurinol 300 Mg  Tabs (Allopurinol) .... 1/2 Tab By Mouth Daily 6)  Amiodarone Hcl 200 Mg Tabs (Amiodarone Hcl) .... 1/2 Tab By Mouth Once Daily 7)  Furosemide 40 Mg  Tabs (Furosemide) .... Take 1/2 Tablet By Mouth  Once Daily 8)  Cozaar 50 Mg Tabs (Losartan Potassium) .... Take 1 Tablet By Mouth Once A Day 9)  Diltiazem Hcl Cr 120 Mg Xr24h-Cap (Diltiazem Hcl) .... Take 1 Capsule By Mouth Once A Day 10)  Adult Aspirin Ec Low Strength 81 Mg  Tbec (Aspirin) .... Take 1 Tab By Mouth At Bedtime 11)  Feosol 45 Mg Tabs (Iron) .... Take 1 Tablet By Mouth Twice A Day 12)  Bystolic 5 Mg  Tabs (Nebivolol  Hcl) .... One Tablet Daily 13)  Albuterol Sulfate (2.5 Mg/33ml) 0.083% Nebu (Albuterol Sulfate) .... Four Times Daily or Every 6 Hours As Needed 14)  Oxygen .... 2.5 L At Bedtime and 3l When Walking  Allergies (verified): No Known Drug Allergies  Past History:  Past medical, surgical, family and social histories (including risk factors) reviewed, and no changes noted (except as noted below).  Past Medical History: Reviewed history from 05/29/2008 and no changes required. Atrial Fibrillation  converted to NSR 5/09 on pacerone Coronary Heart Disease Hyperlipidemia Hypertension COPD PVD  Family History: Reviewed history from 10/18/2009 and no changes required. emphysema - father  heart disease - father cancer - mother (unknown) TB - PGF  Social History: Reviewed history from 10/18/2009 and no changes required. Active smoker - 4-12cigs a day drinks 2 beers daily married 3 children retired - worked in a print shop x14yrsSmoking Status:  current  Review of Systems       The patient complains of shortness of breath with activity and non-productive cough.  The patient denies shortness of breath at rest, productive cough, coughing up blood, chest pain, irregular heartbeats, acid heartburn, indigestion, loss of appetite, weight change, abdominal pain, difficulty swallowing, sore throat, tooth/dental problems, headaches, nasal congestion/difficulty breathing through nose, sneezing, itching, ear ache, anxiety, depression, hand/feet swelling, joint stiffness or pain, rash, change in color of mucus, and fever.  Vital Signs:  Patient profile:   70 year old male Height:      64 inches Weight:      154.38 pounds BMI:     26.60 O2 Sat:      92 % on Room air Temp:     98.5 degrees F oral Pulse rate:   60 / minute BP sitting:   104 / 54  (right arm) Cuff size:   regular  Vitals Entered By: Gweneth Dimitri RN (July 10, 2010 2:12 PM)  O2 Flow:  Room air CC: Follow up.  Pt states  breathing is doing "great."  Pt states he is walking 1 mile/daily and swimming 1-2 x weekly with no problems.  Occ coughing - mainly when tempatures change.  Cough is prod at times with white mucus. Comments Medications reviewed with patient Daytime contact number verified with patient. Gweneth Dimitri RN  July 10, 2010 2:15 PM    Physical Exam  Additional Exam:  GEN: A/Ox3; pleasant , NAD HEENT:  [place]/AT, , EACs-clear, TMs-wnl, NOSE-clear, THROAT-clear NECK:  Supple w/ fair ROM; no JVD; normal carotid impulses w/o bruits; no thyromegaly or nodules palpated; no lymphadenopathy. RESP  Coarse BS , diminshed in bases.,  CARD:  RRR, no m/r/g   GI:   Soft & nt; nml bowel sounds; no organomegaly or masses detected. Musco: Warm bil,  no calf tenderness edema, clubbing, pulses intact Neuro: intact w/ no focal deficits noted.    Impression & Recommendations:  Problem # 1:  CHRONIC OBSTRUCTIVE PULMONARY DISEASE, SEVERE (ICD-496) Assessment Unchanged copd stable plan No change in inhaled medications.   Maintain treatment program as currently prescribed.  Medications Added to Medication List This Visit: 1)  Oxygen  .... 2.5 l at bedtime and 3l when walking  Complete Medication List: 1)  Spiriva Handihaler 18 Mcg Caps (Tiotropium bromide monohydrate) .... Inhale contents of 1 capsule once a day 2)  Coumadin 1 Mg Tabs (Warfarin sodium) .... Take 1 tablet by mouth once a day 3)  Simvastatin 20 Mg Tabs (Simvastatin) .... 1/2 by mouth daily at bedtime 4)  Omeprazole 20 Mg Cpdr (Omeprazole) .... 2 by mouth 30 minutes before the first meal of the day 5)  Allopurinol 300 Mg Tabs (Allopurinol) .... 1/2 tab by mouth daily 6)  Amiodarone Hcl 200 Mg Tabs (Amiodarone hcl) .... 1/2 tab by mouth once daily 7)  Furosemide 40 Mg Tabs (Furosemide) .... Take 1/2 tablet by mouth  once daily 8)  Cozaar 50 Mg Tabs (Losartan potassium) .... Take 1 tablet by mouth once a day 9)  Diltiazem Hcl Cr 120 Mg Xr24h-cap  (Diltiazem hcl) .... Take 1 capsule by mouth once a day 10)  Adult Aspirin Ec Low Strength 81 Mg Tbec (Aspirin) .... Take 1 tab by mouth at bedtime 11)  Feosol 45 Mg Tabs (Iron) .... Take 1 tablet by mouth twice a day 12)  Bystolic 5 Mg Tabs (Nebivolol hcl) .... One tablet daily 13)  Albuterol Sulfate (2.5 Mg/55ml) 0.083% Nebu (Albuterol sulfate) .... Four times daily or every 6 hours as needed 14)  Oxygen  .... 2.5 l at bedtime and 3l when walking  Other Orders: Est. Patient Level III (62376)  Patient Instructions: 1)  No change in medications 2)  Return in     5     months

## 2010-12-10 NOTE — Assessment & Plan Note (Signed)
Summary: Pulmonary OV   Primary Provider/Referring Provider:  Dr. Alonna Buckler  CC:  Acute Visit.  Pt c/o cough - states it is occ prod with " milky" mucus, wheezing, and increased SOB and drainage since Sat.  Denies chest tightness and fever.Marland Kitchen  History of Present Illness: 41  yowm  with COPD/AB   still smoking occasionally.  July 17, 2009 --Last visit flare -- getting hoarse and cough np.  rx prednisone and this helped The mucous is clear, not discolored.The dyspnea and cough are now better.  There is no chest pain. The pt is still smoking 1-3 cigs daily.this pt continues to not take his spiriva/asmanex program regularly  October 18, 2009--Presents for an acute office visit. Complains of prod cough with thick milk-colored mucus, head congestion, PND x2days.  Nasal congestion and drainage white/clear. Denies chest pain,  orthopnea, hemoptysis, fever, n/v/d, edema, headache,recent travel or antibiotics. No otc used. Continues on nocturnal O2. No fluid retention.   October 31, 2009 12:05 PM saw NP 12/9  for AB flare and rx doxycycline now: the ABX helped at the last ov. Still occ smokes  Now is coughing mucous but is clear.  Some dyspnea but at baseline.  November 12, 2009--Presents for an acute office visit. Complains that his cough/congestion returned for last 3 days. Now he is coughing and wheeizng. No smoking x 2 weeks. No otc used. Denies chest pain, dyspnea, orthopnea, hemoptysis, fever, n/v/d, edema, headache. No abd pain or urinary symptoms.   January 10, 2010 --Presents for an acute office visit. Pt c/o lethargic, loss of appetite, wheezing, productive cough with "milky" mucus since the weekend.  Has had 1 week of cough and congestion. Still smoking. Denies chest pain, dyspnea, orthopnea, hemoptysis, fever, n/v/d, edema, headache. Did not take abx last visit, was tx w/ steroid taper 11/2009. February 13, 2010 3:56 PM  the pt notes more cough and wheezing past one week.  No f/c/s.  Pt  denies chest pain.  Pt with more dyspnea and excess mucous. Pt denies smoking.   Preventive Screening-Counseling & Management  Alcohol-Tobacco     Smoking Status: quit < 6 months     Year Quit: 01/2010  Current Medications (verified): 1)  Spiriva Handihaler 18 Mcg  Caps (Tiotropium Bromide Monohydrate) .... Inhale Contents of 1 Capsule Once A Day 2)  Asmanex 60 Metered Doses 220 Mcg/inh Aepb (Mometasone Furoate) .... 2 Puffs By Mouth Once Daily 3)  Coumadin 1 Mg  Tabs (Warfarin Sodium) .... Take 1 Tablet By Mouth Once A Day 4)  Simvastatin 20 Mg Tabs (Simvastatin) .... 1/2 By Mouth Daily At Bedtime 5)  Omeprazole 20 Mg Cpdr (Omeprazole) .... 2 By Mouth 30 Minutes Before The First Meal of The Day 6)  Allopurinol 300 Mg  Tabs (Allopurinol) .... 1/2 Tab By Mouth Daily 7)  Amiodarone Hcl 200 Mg Tabs (Amiodarone Hcl) .... 1/2 Tab By Mouth Once Daily 8)  Furosemide 40 Mg  Tabs (Furosemide) .... Take 1/2 Tablet By Mouth  Once Daily 9)  Cozaar 50 Mg Tabs (Losartan Potassium) .... Take 1 Tablet By Mouth Once A Day 10)  Diltiazem Hcl Cr 120 Mg Xr24h-Cap (Diltiazem Hcl) .... Take 1 Capsule By Mouth Once A Day 11)  Adult Aspirin Ec Low Strength 81 Mg  Tbec (Aspirin) .... Take 1 Tab By Mouth At Bedtime 12)  Feosol 45 Mg Tabs (Iron) .... Take 1 Tablet By Mouth Twice A Day 13)  Bystolic 5 Mg  Tabs (  Nebivolol Hcl) .... One Tablet Daily 14)  Albuterol Sulfate (2.5 Mg/32ml) 0.083%  Nebu (Albuterol Sulfate) .... Four Times Daily or Every 6 Hours As Needed 15)  Oxygen .... 2.5 L At Bedtime and As Needed Daytime With Exertion 3l  Allergies (verified): No Known Drug Allergies  Past History:  Past medical, surgical, family and social histories (including risk factors) reviewed, and no changes noted (except as noted below).  Past Medical History: Reviewed history from 05/29/2008 and no changes required. Atrial Fibrillation  converted to NSR 5/09 on pacerone Coronary Heart  Disease Hyperlipidemia Hypertension COPD PVD  Family History: Reviewed history from 10/18/2009 and no changes required. emphysema - father  heart disease - father cancer - mother (unknown) TB - PGF  Social History: Reviewed history from 10/18/2009 and no changes required. Active smoker - 4-12cigs a day drinks 2 beers daily married 3 children retired - worked in a print shop x53yrsSmoking Status:  quit < 6 months  Review of Systems       The patient complains of shortness of breath with activity, shortness of breath at rest, productive cough, non-productive cough, and change in color of mucus.  The patient denies coughing up blood, chest pain, irregular heartbeats, acid heartburn, indigestion, loss of appetite, weight change, abdominal pain, difficulty swallowing, sore throat, tooth/dental problems, headaches, nasal congestion/difficulty breathing through nose, sneezing, itching, ear ache, anxiety, depression, hand/feet swelling, joint stiffness or pain, and fever.    Vital Signs:  Patient profile:   70 year old male Height:      66 inches Weight:      158.50 pounds BMI:     25.68 O2 Sat:      89 % on Room air Temp:     98.1 degrees F oral Pulse rate:   64 / minute BP sitting:   102 / 60  (left arm) Cuff size:   regular  Vitals Entered By: Gweneth Dimitri RN (February 13, 2010 3:38 PM)  O2 Flow:  Room air  O2 Sat Comments Pt arrived to exam room with o2 sat of 89% RA.  After resting for a minute, o2 sat increased to 91% RA with pulse of 64.  Gweneth Dimitri RN  February 13, 2010 3:41 PM  CC: Acute Visit.  Pt c/o cough - states it is occ prod with " milky" mucus, wheezing, increased SOB and drainage since Sat.  Denies chest tightness and fever. Comments Medications reviewed with patient Daytime contact number verified with patient. Gweneth Dimitri RN  February 13, 2010 3:38 PM    Physical Exam  Additional Exam:  GEN: A/Ox3; pleasant , NAD HEENT:  [place]/AT, , EACs-clear, TMs-wnl,  NOSE-clear, THROAT-clear NECK:  Supple w/ fair ROM; no JVD; normal carotid impulses w/o bruits; no thyromegaly or nodules palpated; no lymphadenopathy. RESP  Coarse BS w/ scattered rhonchi , insp / exp wheeze  CARD:  RRR, no m/r/g   GI:   Soft & nt; nml bowel sounds; no organomegaly or masses detected. Musco: Warm bil,  no calf tenderness edema, clubbing, pulses intact Neuro: intact w/ no focal deficits noted.    Impression & Recommendations:  Problem # 1:  CHRONIC OBSTRUCTIVE PULMONARY DISEASE, SEVERE (ICD-496) Assessment Deteriorated Acute tracheobronchitis with flare  plan Suprax 400mg  twice daily for 6 days Prednisone 10mg  4 each am x3days, 3 x 3days, 2 x 3days, 1 x 3days then stop All other medications will be the same Return 2-3 weeks for recheck with Tammy Parrett See Dr Delford Field  3 months   Medications Added to Medication List This Visit: 1)  Suprax 400 Mg Tabs (Cefixime) .... One by mouth two times a day 2)  Prednisone 10 Mg Tabs (Prednisone) .... Take as directed 4 each am x3days, 3 x 3days, 2 x 3days, 1 x 3days then stop  Complete Medication List: 1)  Spiriva Handihaler 18 Mcg Caps (Tiotropium bromide monohydrate) .... Inhale contents of 1 capsule once a day 2)  Asmanex 60 Metered Doses 220 Mcg/inh Aepb (Mometasone furoate) .... 2 puffs by mouth once daily 3)  Coumadin 1 Mg Tabs (Warfarin sodium) .... Take 1 tablet by mouth once a day 4)  Simvastatin 20 Mg Tabs (Simvastatin) .... 1/2 by mouth daily at bedtime 5)  Omeprazole 20 Mg Cpdr (Omeprazole) .... 2 by mouth 30 minutes before the first meal of the day 6)  Allopurinol 300 Mg Tabs (Allopurinol) .... 1/2 tab by mouth daily 7)  Amiodarone Hcl 200 Mg Tabs (Amiodarone hcl) .... 1/2 tab by mouth once daily 8)  Furosemide 40 Mg Tabs (Furosemide) .... Take 1/2 tablet by mouth  once daily 9)  Cozaar 50 Mg Tabs (Losartan potassium) .... Take 1 tablet by mouth once a day 10)  Diltiazem Hcl Cr 120 Mg Xr24h-cap (Diltiazem hcl) ....  Take 1 capsule by mouth once a day 11)  Adult Aspirin Ec Low Strength 81 Mg Tbec (Aspirin) .... Take 1 tab by mouth at bedtime 12)  Feosol 45 Mg Tabs (Iron) .... Take 1 tablet by mouth twice a day 13)  Bystolic 5 Mg Tabs (Nebivolol hcl) .... One tablet daily 14)  Albuterol Sulfate (2.5 Mg/30ml) 0.083% Nebu (Albuterol sulfate) .... Four times daily or every 6 hours as needed 15)  Oxygen  .... 2.5 l at bedtime and as needed daytime with exertion 3l 16)  Suprax 400 Mg Tabs (Cefixime) .... One by mouth two times a day 17)  Prednisone 10 Mg Tabs (Prednisone) .... Take as directed 4 each am x3days, 3 x 3days, 2 x 3days, 1 x 3days then stop  Patient Instructions: 1)  Suprax 400mg  twice daily for 6 days 2)  Prednisone 10mg  4 each am x3days, 3 x 3days, 2 x 3days, 1 x 3days then stop 3)  All other medications will be the same 4)  Return 2-3 weeks for recheck with Tammy Parrett 5)  See Dr Delford Field 3 months  Prescriptions: PREDNISONE 10 MG  TABS (PREDNISONE) Take as directed 4 each am x3days, 3 x 3days, 2 x 3days, 1 x 3days then stop  #30 x 0   Entered and Authorized by:   Storm Frisk MD   Signed by:   Storm Frisk MD on 02/13/2010   Method used:   Electronically to        [facility]  [address] [phone].* (retail)       [address]       [place], [place]  [address]       Ph: [phone] or [phone]       Fax: [phone]   RxID:   [id number] SUPRAX 400 MG TABS (CEFIXIME) One by mouth two times a day  #12 x 0   Entered and Authorized by:   Storm Frisk MD   Signed by:   Storm Frisk MD on 02/13/2010   Method used:   Electronically to        [facility]  [address] [phone][facility] (retail)  [address]       [place], [place]  [address]       Ph: [phone] or [phone]       Fax: [phone]   RxID:   [id number]

## 2010-12-10 NOTE — Assessment & Plan Note (Signed)
Summary: Acute NP office visit   Primary Provider/Referring Provider:  Dr. Alonna Buckler  CC:  sneezing, runny, increased fatigue, dry cough, increased SOB, and wheezing x3days - denies f/c/s.  History of Present Illness: 70  yowm  with COPD/AB   still smoking occasionally.  July 17, 2009 --Last visit flare -- getting hoarse and cough np.  rx prednisone and this helped The mucous is clear, not discolored.The dyspnea and cough are now better.  There is no chest pain. The pt is still smoking 1-3 cigs daily.this pt continues to not take his spiriva/asmanex program regularly  October 18, 2009--Presents for an acute office visit. Complains of prod cough with thick milk-colored mucus, head congestion, PND x2days.  Nasal congestion and drainage white/clear. Denies chest pain,  orthopnea, hemoptysis, fever, n/v/d, edema, headache,recent travel or antibiotics. No otc used. Continues on nocturnal O2. No fluid retention.   October 31, 2009 12:05 PM saw NP 12/9  for AB flare and rx doxycycline now: the ABX helped at the last ov. Still occ smokes  Now is coughing mucous but is clear.  Some dyspnea but at baseline.  November 12, 2009--Presents for an acute office visit. Complains that his cough/congestion returned for last 3 days. Now he is coughing and wheeizng. No smoking x 2 weeks. No otc used. Denies chest pain, dyspnea, orthopnea, hemoptysis, fever, n/v/d, edema, headache. No abd pain or urinary symptoms.   January 10, 2010 --Presents for an acute office visit. Pt c/o lethargic, loss of appetite, wheezing, productive cough with "milky" mucus since the weekend.  Has had 1 week of cough and congestion. Still smoking. Denies chest pain, dyspnea, orthopnea, hemoptysis, fever, n/v/d, edema, headache. Did not take abx last visit, was tx w/ steroid taper 11/2009. February 13, 2010 -the pt notes more cough and wheezing past one week.  No f/c/s.  Pt denies chest pain.  Pt with more dyspnea and excess mucous Pt  denies smoking.  02/28/10--Presents for 2 week follow up - pt states he doing "great" and has no complaints today. Last visit w/ COPD flare , tx w/ Suprax and steroid taper.  He is feeling much better. Cough /congesiton is improved. Denies chest pain, dyspnea, orthopnea, hemoptysis, fever, n/v/d, edema, headache.   Apr 05, 2010--Presents for an acute office visit. Complains of sneezing, runny, increased fatigue, dry cough, increased SOB, wheezing x3day.  OTC meds not helping. Congestion in sinuses, drainage in throat, ocugh is g  getting worse. Denies chest pain,  orthopnea, hemoptysis, fever, n/v/d, edema, headache  Medications Prior to Update: 1)  Spiriva Handihaler 18 Mcg  Caps (Tiotropium Bromide Monohydrate) .... Inhale Contents of 1 Capsule Once A Day 2)  Coumadin 1 Mg  Tabs (Warfarin Sodium) .... Take 1 Tablet By Mouth Once A Day 3)  Simvastatin 20 Mg Tabs (Simvastatin) .... 1/2 By Mouth Daily At Bedtime 4)  Omeprazole 20 Mg Cpdr (Omeprazole) .... 2 By Mouth 30 Minutes Before The First Meal of The Day 5)  Allopurinol 300 Mg  Tabs (Allopurinol) .... 1/2 Tab By Mouth Daily 6)  Amiodarone Hcl 200 Mg Tabs (Amiodarone Hcl) .... 1/2 Tab By Mouth Once Daily 7)  Furosemide 40 Mg  Tabs (Furosemide) .... Take 1/2 Tablet By Mouth  Once Daily 8)  Cozaar 50 Mg Tabs (Losartan Potassium) .... Take 1 Tablet By Mouth Once A Day 9)  Diltiazem Hcl Cr 120 Mg Xr24h-Cap (Diltiazem Hcl) .... Take 1 Capsule By Mouth Once A Day 10)  Adult  Aspirin Ec Low Strength 81 Mg  Tbec (Aspirin) .... Take 1 Tab By Mouth At Bedtime 11)  Feosol 45 Mg Tabs (Iron) .... Take 1 Tablet By Mouth Twice A Day 12)  Bystolic 5 Mg  Tabs (Nebivolol Hcl) .... One Tablet Daily 13)  Albuterol Sulfate (2.5 Mg/72ml) 0.083%  Nebu (Albuterol Sulfate) .... Four Times Daily or Every 6 Hours As Needed 14)  Oxygen .... 2.5 L At Bedtime and As Needed Daytime With Exertion 3l  Current Medications (verified): 1)  Spiriva Handihaler 18 Mcg  Caps  (Tiotropium Bromide Monohydrate) .... Inhale Contents of 1 Capsule Once A Day 2)  Coumadin 1 Mg  Tabs (Warfarin Sodium) .... Take 1 Tablet By Mouth Once A Day 3)  Simvastatin 20 Mg Tabs (Simvastatin) .... 1/2 By Mouth Daily At Bedtime 4)  Omeprazole 20 Mg Cpdr (Omeprazole) .... 2 By Mouth 30 Minutes Before The First Meal of The Day 5)  Allopurinol 300 Mg  Tabs (Allopurinol) .... 1/2 Tab By Mouth Daily 6)  Amiodarone Hcl 200 Mg Tabs (Amiodarone Hcl) .... 1/2 Tab By Mouth Once Daily 7)  Furosemide 40 Mg  Tabs (Furosemide) .... Take 1/2 Tablet By Mouth  Once Daily 8)  Cozaar 50 Mg Tabs (Losartan Potassium) .... Take 1 Tablet By Mouth Once A Day 9)  Diltiazem Hcl Cr 120 Mg Xr24h-Cap (Diltiazem Hcl) .... Take 1 Capsule By Mouth Once A Day 10)  Adult Aspirin Ec Low Strength 81 Mg  Tbec (Aspirin) .... Take 1 Tab By Mouth At Bedtime 11)  Feosol 45 Mg Tabs (Iron) .... Take 1 Tablet By Mouth Twice A Day 12)  Bystolic 5 Mg  Tabs (Nebivolol Hcl) .... One Tablet Daily 13)  Albuterol Sulfate (2.5 Mg/51ml) 0.083%  Nebu (Albuterol Sulfate) .... Four Times Daily or Every 6 Hours As Needed 14)  Oxygen .... 2.5 L At Bedtime and As Needed Daytime With Exertion 3l  Allergies (verified): No Known Drug Allergies  Past History:  Past Medical History: Last updated: 05/29/2008 Atrial Fibrillation  converted to NSR 5/09 on pacerone Coronary Heart Disease Hyperlipidemia Hypertension COPD PVD  Family History: Last updated: 10/18/2009 emphysema - father  heart disease - father cancer - mother (unknown) TB - PGF  Social History: Last updated: 10/18/2009 Active smoker - 4-12cigs a day drinks 2 beers daily married 3 children retired - worked in a print shop x41yrs  Risk Factors: Alcohol Use: <1 (03/12/2009)  Risk Factors: Smoking Status: quit < 6 months (02/13/2010) Packs/Day: <0.25 (10/31/2009)  Review of Systems      See HPI  Vital Signs:  Patient profile:   70 year old male Height:      66  inches Weight:      156 pounds BMI:     25.27 O2 Sat:      92 % on Room air Temp:     97.7 degrees F oral Pulse rate:   63 / minute BP sitting:   104 / 66  (right arm) Cuff size:   regular  Vitals Entered By: Boone Master CNA/MA (Apr 05, 2010 2:55 PM)  O2 Flow:  Room air CC: sneezing, runny, increased fatigue, dry cough, increased SOB, wheezing x3days - denies f/c/s Is Patient Diabetic? No Comments Medications reviewed with patient Daytime contact number verified with patient. Boone Master CNA/MA  Apr 05, 2010 2:55 PM    Physical Exam  Additional Exam:  GEN: A/Ox3; pleasant , NAD HEENT:  /AT, , EACs-clear, TMs-wnl, NOSE-clear, THROAT-clear NECK:  Supple w/ fair  ROM; no JVD; normal carotid impulses w/o bruits; no thyromegaly or nodules palpated; no lymphadenopathy. RESP  Coarse BS , diminshed in bases., faint exp wheeze  CARD:  RRR, no m/r/g   GI:   Soft & nt; nml bowel sounds; no organomegaly or masses detected. Musco: Warm bil,  no calf tenderness edema, clubbing, pulses intact Neuro: intact w/ no focal deficits noted.    Impression & Recommendations:  Problem # 1:  CHRONIC OBSTRUCTIVE PULMONARY DISEASE, SEVERE (ICD-496) Mild bronchitic flare.  REC:   Augmentin 875mg  two times a day for 7 days Mucinex DM two times a day as needed cough congesiton Increase fluids and rest Please contact office for sooner follow up if symptoms do not improve or worsen   Medications Added to Medication List This Visit: 1)  Augmentin 875-125 Mg Tabs (Amoxicillin-pot clavulanate) .Marland Kitchen.. 1 by mouth two times a day  Complete Medication List: 1)  Spiriva Handihaler 18 Mcg Caps (Tiotropium bromide monohydrate) .... Inhale contents of 1 capsule once a day 2)  Coumadin 1 Mg Tabs (Warfarin sodium) .... Take 1 tablet by mouth once a day 3)  Simvastatin 20 Mg Tabs (Simvastatin) .... 1/2 by mouth daily at bedtime 4)  Omeprazole 20 Mg Cpdr (Omeprazole) .... 2 by mouth 30 minutes before the first  meal of the day 5)  Allopurinol 300 Mg Tabs (Allopurinol) .... 1/2 tab by mouth daily 6)  Amiodarone Hcl 200 Mg Tabs (Amiodarone hcl) .... 1/2 tab by mouth once daily 7)  Furosemide 40 Mg Tabs (Furosemide) .... Take 1/2 tablet by mouth  once daily 8)  Cozaar 50 Mg Tabs (Losartan potassium) .... Take 1 tablet by mouth once a day 9)  Diltiazem Hcl Cr 120 Mg Xr24h-cap (Diltiazem hcl) .... Take 1 capsule by mouth once a day 10)  Adult Aspirin Ec Low Strength 81 Mg Tbec (Aspirin) .... Take 1 tab by mouth at bedtime 11)  Feosol 45 Mg Tabs (Iron) .... Take 1 tablet by mouth twice a day 12)  Bystolic 5 Mg Tabs (Nebivolol hcl) .... One tablet daily 13)  Albuterol Sulfate (2.5 Mg/45ml) 0.083% Nebu (Albuterol sulfate) .... Four times daily or every 6 hours as needed 14)  Oxygen  .... 2.5 l at bedtime and as needed daytime with exertion 3l 15)  Augmentin 875-125 Mg Tabs (Amoxicillin-pot clavulanate) .Marland Kitchen.. 1 by mouth two times a day  Other Orders: Est. Patient Level III (11914)  Patient Instructions: 1)  Augmentin 875mg  two times a day for 7 days 2)  Mucinex DM two times a day as needed cough congesiton 3)  Increase fluids and rest 4)  Please contact office for sooner follow up if symptoms do not improve or worsen  Prescriptions: AUGMENTIN 875-125 MG TABS (AMOXICILLIN-POT CLAVULANATE) 1 by mouth two times a day  #14 x 0   Entered and Authorized by:   Rubye Oaks NP   Signed by:   Tammy Parrett NP on 04/05/2010   Method used:   Electronically to        [facility]  [address] [phone].* (retail)       [address]       [place], [place]  [address]       Ph: [phone] or [phone]       Fax: [phone]   RxID:   [id number]

## 2010-12-10 NOTE — Letter (Signed)
Summary: Certificate of Medical Necessity for Oxygen/[facility]    Certificate of Medical Necessity for Oxygen/[facility]   Imported By: Maryln Gottron 02/04/2010 13:33:45  _____________________________________________________________________  External Attachment:    Type:   Image     Comment:   External Document

## 2010-12-12 NOTE — Assessment & Plan Note (Signed)
Summary: Acute NP office visit - COPD   Primary Provider/Referring Provider:  Dr. Alonna Buckler  CC:  prod cough with light yellow/clear mucus, wheezing, DOE, fatigue, and sore throat x1.5weeks - denies f/c/s.  History of Present Illness: 70   yowm  with COPD/AB   still smoking occasionally.  Aug 31: No new issues, not smoking.   October 11, 2010 --Presents for an acute office visit. Complains of cough with clear mucus, wheezing, DOE, runny nose x3-4days,. OTC not helping Denies chest pain,  orthopnea, hemoptysis, fever, n/v/d, edema, headache,  November 22, 2010--Presents for prod cough with light yellow/clear mucus, wheezing, DOE, fatigue, sore throat x1.5weeks.  OTC not helping. Seen 6 weeks ago, tx w/Omnicef with resolution of symptoms.Denies chest pain, orthopnea, hemoptysis, fever, n/v/d, edema, headache. We talked about his smoking "he is just not ready to quit".    Medications Prior to Update: 1)  Asmanex 60 Metered Doses 220 Mcg/inh Aepb (Mometasone Furoate) .... Inhale 1 Puff Two Times A Day 2)  Spiriva Handihaler 18 Mcg  Caps (Tiotropium Bromide Monohydrate) .... Inhale Contents of 1 Capsule Once A Day 3)  Coumadin 1 Mg  Tabs (Warfarin Sodium) .... Take 1 Tablet By Mouth Once A Day 4)  Simvastatin 20 Mg Tabs (Simvastatin) .... 1/2 By Mouth Daily At Bedtime 5)  Omeprazole 20 Mg Cpdr (Omeprazole) .... 2 By Mouth 30 Minutes Before The First Meal of The Day 6)  Allopurinol 300 Mg  Tabs (Allopurinol) .... 1/2 Tab By Mouth Daily 7)  Amiodarone Hcl 200 Mg Tabs (Amiodarone Hcl) .... 1/2 Tab By Mouth Once Daily 8)  Furosemide 40 Mg  Tabs (Furosemide) .... Take 1/2 Tablet By Mouth  Once Daily 9)  Cozaar 50 Mg Tabs (Losartan Potassium) .... Take 1 Tablet By Mouth Once A Day 10)  Diltiazem Hcl Cr 120 Mg Xr24h-Cap (Diltiazem Hcl) .... Take 1 Capsule By Mouth Once A Day 11)  Adult Aspirin Ec Low Strength 81 Mg  Tbec (Aspirin) .... Take 1 Tab By Mouth At Bedtime 12)  Feosol 45 Mg Tabs (Iron)  .... Take 1 Tablet By Mouth Twice A Day 13)  Bystolic 5 Mg  Tabs (Nebivolol Hcl) .... One Tablet Daily 14)  Albuterol Sulfate (2.5 Mg/65ml) 0.083% Nebu (Albuterol Sulfate) .... Four Times Daily or Every 6 Hours As Needed 15)  Oxygen .... 2.5 L At Bedtime and 3l When Walking 16)  Cefdinir 300 Mg Caps (Cefdinir) .Marland Kitchen.. 1 By Mouth Two Times A Day 17)  Prednisone 10 Mg Tabs (Prednisone) .... 4 Tabs For 2 Days, Then 3 Tabs For 2 Days, 2 Tabs For 2 Days, Then 1 Tab For 2 Days, Then Stop  Current Medications (verified): 1)  Spiriva Handihaler 18 Mcg  Caps (Tiotropium Bromide Monohydrate) .... Inhale Contents of 1 Capsule Once A Day 2)  Coumadin 1 Mg  Tabs (Warfarin Sodium) .... Take 1 Tablet By Mouth Once A Day 3)  Simvastatin 20 Mg Tabs (Simvastatin) .... 1/2 By Mouth Daily At Bedtime 4)  Omeprazole 20 Mg Cpdr (Omeprazole) .... 2 By Mouth 30 Minutes Before The First Meal of The Day 5)  Allopurinol 300 Mg  Tabs (Allopurinol) .... 1/2 Tab By Mouth Daily 6)  Amiodarone Hcl 200 Mg Tabs (Amiodarone Hcl) .... 1/2 Tab By Mouth Once Daily 7)  Furosemide 40 Mg  Tabs (Furosemide) .... Take 1/2 Tablet By Mouth  Once Daily 8)  Cozaar 50 Mg Tabs (Losartan Potassium) .... Take 1 Tablet By Mouth Once A Day 9)  Diltiazem  Hcl Cr 120 Mg Xr24h-Cap (Diltiazem Hcl) .... Take 1 Capsule By Mouth Once A Day 10)  Adult Aspirin Ec Low Strength 81 Mg  Tbec (Aspirin) .... Take 1 Tab By Mouth At Bedtime 11)  Feosol 45 Mg Tabs (Iron) .... Take 1 Tablet By Mouth Twice A Day 12)  Bystolic 5 Mg  Tabs (Nebivolol Hcl) .... One Tablet Daily 13)  Albuterol Sulfate (2.5 Mg/37ml) 0.083% Nebu (Albuterol Sulfate) .... Four Times Daily or Every 6 Hours As Needed 14)  Oxygen .... 2.5 L At Bedtime and 3l When Walking 15)  Asmanex 60 Metered Doses 220 Mcg/inh Aepb (Mometasone Furoate) .... Inhale 1 Puff Two Times A Day  Allergies (verified): No Known Drug Allergies  Past History:  Past Medical History: Last updated: 05/29/2008 Atrial  Fibrillation  converted to NSR 5/09 on pacerone Coronary Heart Disease Hyperlipidemia Hypertension COPD PVD  Family History: Last updated: 10/18/2009 emphysema - father  heart disease - father cancer - mother (unknown) TB - PGF  Social History: Last updated: 10/18/2009 Active smoker - 4-12cigs a day drinks 2 beers daily married 3 children retired - worked in a print shop x69yrs  Risk Factors: Smoking Status: current (07/10/2010) Packs/Day: <0.25 (07/10/2010)  Review of Systems      See HPI  Vital Signs:  Patient profile:   70 year old male Height:      64 inches Weight:      157.38 pounds BMI:     27.11 O2 Sat:      95 % on Room air Temp:     97.1 degrees F oral Pulse rate:   71 / minute BP sitting:   112 / 62  (left arm) Cuff size:   regular  Vitals Entered By: Boone Master CNA/MA (November 22, 2010 2:50 PM)  O2 Flow:  Room air CC: prod cough with light yellow/clear mucus, wheezing, DOE, fatigue, sore throat x1.5weeks - denies f/c/s Is Patient Diabetic? No Comments Medications reviewed with patient Daytime contact number verified with patient. Boone Master CNA/MA  November 22, 2010 2:50 PM    Physical Exam  Additional Exam:  GEN: A/Ox3; pleasant , NAD HEENT:  /AT, , EACs-clear, TMs-wnl, NOSE-clear, THROAT-clear NECK:  Supple w/ fair ROM; no JVD; normal carotid impulses w/o bruits; no thyromegaly or nodules palpated; no lymphadenopathy. RESP  Coarse BS  diminshed in bases., no wheezing or crackles  CARD:  RRR, no m/r/g   GI:   Soft & nt; nml bowel sounds; no organomegaly or masses detected. Musco: Warm bil,  no calf tenderness edema, clubbing, pulses intact Neuro: intact w/ no focal deficits noted.    Impression & Recommendations:  Problem # 1:  CHRONIC OBSTRUCTIVE PULMONARY DISEASE, SEVERE (ICD-496)  Flare Plan Doxycycline 100mg  two times a day   Please contact office for sooner follow up if symptoms do not improve or worsen  follow up Dr.  Delford Field in 6  WORK ON NOT SMOKING, THAT WOULD BE A GREAT START TO THIS YEAR.   Orders: Est. Patient Level III (91478)  Medications Added to Medication List This Visit: 1)  Doxycycline Hyclate 100 Mg Caps (Doxycycline hyclate) .Marland Kitchen.. 1 by mouth two times a day  Patient Instructions: 1)  Doxycycline 100mg  two times a day   2)  Please contact office for sooner follow up if symptoms do not improve or worsen  3)  follow up Dr. Delford Field in 6  4)  WORK ON NOT SMOKING, THAT WOULD BE A GREAT START TO THIS YEAR.  Prescriptions: DOXYCYCLINE HYCLATE 100 MG CAPS (DOXYCYCLINE HYCLATE) 1 by mouth two times a day  #14 x 0   Entered and Authorized by:   Rubye Oaks NP   Signed by:   Karli Wickizer NP on 11/22/2010   Method used:   [facility] to        [facility]  [address] [phone][facility] (retail)       [address]       [place], [place]  [address]       Ph: [phone] or [phone]       Fax: [phone]   RxID:   [id number]

## 2011-03-25 NOTE — Consult Note (Signed)
NAME:  Jason Larsen, Jason Larsen                 ACCOUNT NO.:  [id number]   MEDICAL RECORD NO.:  [id number]          PATIENT TYPE:  INP   LOCATION:  6527                         FACILITY:  [facility]   PHYSICIAN:  Charlcie Cradle. Delford Field, MD, FCCPDATE OF BIRTH:  October 19, 1941   DATE OF CONSULTATION:  01/06/2008  DATE OF DISCHARGE:                                 CONSULTATION   Before elective cardiac catheterization on January 04, 2008, he was  found to have upper GI bleeding and the catheterization was cancelled.  Endoscopy showed gastritis which was chronic.  His hemoglobin had  fallen.  He had heme-positive stools.  The patient, pulmonary-wise,  notes increased wheezing and dry cough.  Cough is occasionally  productive of clear mucus.  He has advanced chronic obstructive lung  disease on Spiriva and p.r.n. Proventil at home.  He is not on home  oxygen.  He is still actively smoking one to two packs a day of  cigarettes, has done so for many years, has not made any attempts to  quit smoking.  He is cared for by pulmonologist in the [facility] medical  system for preoperative clearance for potential redo bypass surgery and  pulmonary management.   PAST MEDICAL HISTORY:  1. Previous history of bypass surgery at [facility] in November      1997 with a LIMA to the LAD, vein graft to the PD and SVG to the OM-      1 and OM-2.  2. He has had right hip replacement.  3. Hypertension.  4. Hyperlipidemia.  5. Long-term active smoking with COPD.  6. Osteoarthritis.  7. History of recurrent gout.   SOCIAL HISTORY:  The patient is married with three children and eight  grandchildren.  He did walk a mile a day until December.  He continues  to smoke a pack a day for 50+ years.  Retired for [employer] work.   FAMILY HISTORY:  Noncontributory.   MEDICATIONS PRIOR TO ADMISSION:  1. Allopurinol 150 mg daily.  2. Atenolol 100 mg daily.  3. Lisinopril 20 mg daily.  4. Singulair 10 mg p.r.n.  5. Terazosin 2 mg  daily.  6. Furosemide 40 mg daily.  7. Warfarin daily.  8. Bentyl p.r.n.  9. Asmanex p.r.n.  10.Spiriva p.r.n.  11.Proventil p.r.n.   ALLERGIES:  None.   PHYSICAL EXAMINATION:  VITAL SIGNS:  Respirations 24, temperature 98,  sat 94% room air.  CHEST:  Coarse rhonchi, bilaterally expired wheezes.  CARDIAC:  Exam showed a regular rate and rhythm without S3, normal S1  and S2.  ABDOMEN:  Soft, nontender.  EXTREMITIES:  No edema, clubbing or venous disease.  SKIN:  Clear.  NEUROLOGIC:  Exam was intact.  HEENT:  Exam showed no jugular venous distension, no lymphadenopathy.   LABORATORY DATA:  Spirometry was obtained and showed an FEV-1 of 0.59  which is 23% of predicted, FVC of 1.30 which is 35% predicted, FEV-1:FEC  ratio 45% which is low, FEF 2575 low at 8% predicted.   Chest x-ray is not obtained.   Sodium 137, potassium 4.3, chloride  101, CO2 33, BUN 19, creatinine 0.7,  blood sugar 99.  Hemoglobin 8.6, white count 5.4, platelet count 157.   IMPRESSION:  1. Advanced gold stage IV chronic obstructive lung disease with      primary emphysematous component and asthmatic bronchitic amount      with active ongoing smoking.  2. Coronary artery disease with previous bypass surgery 1997 now with      increased ischemia on recent nuclear medicine study, admitted for      elective catheterization.  3. Upper gastrointestinal bleed with chronic erosions and gastritis      and associated coagulopathy.  4. Anemia from acute blood loss.   RECOMMENDATIONS:  Check blood gas and chest x-ray, add Brovana and  Pulmicort to nebulizers, no systemic steroids because of erosive  gastritis.   DISCUSSION:  This is a high-risk patient, if redo bypass surgery is  considered, if he has advanced coronary artery disease or vein graft  stenosis hopefully the patient will be amenable to PTCA and stenting.  He needs to be off cigarettes for six weeks and his gastritis needs to  heal before redo bypass  surgery is to even be considered.      Charlcie Cradle Delford Field, MD, [facility]  Electronically Signed     PEW/MEDQ  D:  01/06/2008  T:  01/07/2008  Job:  [id number]   cc:   Alonna Buckler, M.D.  Dept of Pulmonary Medicine [facility]

## 2011-03-25 NOTE — Discharge Summary (Signed)
Jason Larsen, Jason Larsen NO.:  [id number]   MEDICAL RECORD NO.:  [id number]          PATIENT TYPE:  INP   LOCATION:  6729                         FACILITY:  [facility]   PHYSICIAN:  Nicki Guadalajara, M.D.     DATE OF BIRTH:  10/26/1941   DATE OF ADMISSION:  01/04/2008  DATE OF DISCHARGE:  01/10/2008                               DISCHARGE SUMMARY   DISCHARGE DIAGNOSES:  1. Anemia with gastrointestinal bleed this admission secondary to      gastritis and erosions, stable.  2. Atrial fibrillation, Coumadin prior to admission, discharged on      fixed dose of 1 mg daily.  3. Coronary disease, coronary artery bypass grafting at [facility] in 1997,      with catheterization this admission revealing a patent left      internal mammary artery to the left anterior descending, patent      saphenous vein graft to the obtuse marginal and circumflex and      patent saphenous vein graft to the right coronary artery.  4. Mild left ventricular dysfunction with an ejection fraction of 45%.  5. Peripheral vascular disease with 60% left renal artery narrowing.  6. Severe chronic obstructive pulmonary disease.   HOSPITAL COURSE:  The patient is a 70 year old male who was seen in  [place] by Dr. Tresa Endo.  He had bypass surgery as noted above.  He had  recent onset of atrial fibrillation with increasing dyspnea.  An  outpatient Myoview study suggested ischemia.  He was set up for  catheterization at [facility].  Coumadin was held.  It did take  a long time for his INR on February 24, and when he presented, he had  black stools and was noted to be anemic with hemoglobin of 9 and INR  still at 2.3.  He was admitted to telemetry.  He was seen in consult by  the GI service, Dr. [name].  He also had significant COPD and dyspnea  and was seen in consult by pulmonary service.  PFTs confirm severe  obstructive airway disease.  Inhalers were adjusted.  Endoscopy revealed  multiple erosions.  Plan  is for Protonix therapy.  Dr. [name] also  suggested we keep his INR as low as possible.  By February 27, his INR  had dropped to 1.6 and we felt he was stable for diagnostic  catheterization.  These results are as noted above.  He had severe  native vessel disease with patent grafts and mild LV dysfunction.  Later  that night, fell going to the bathroom and suffered a hematoma and  ecchymosis area to his right forehead around his right eye.  He had no  permanent effects from this.  He continued to have some problems with  his respiratory status and on February 28, rapid response was called for  dropping O2 saturations and increasing dyspnea.  Pulmonary service made  adjustments in his medications.  We feel he can be discharged on January 10, 2008.  Pulmonary services suggesting he follow up with the pulmonary  service at the [facility] system.  DISCHARGE MEDICATIONS:  1. Protonix 40 mg p.o. b.i.d. x1 month and then 40 mg a day.  2. Nitroglycerin sublingual p.r.n.  3. Aspirin 81 mg a day.  4. Atenolol 25 mg twice a day.  5. Furosemide 40 mg twice a day.  6. Allopurinol 150 mg a day.  7. Spiriva 18 mcg a day.  8. Coumadin 1 mg daily, not to be adjusted.  9. Diovan 40 mg a day.  10.Imdur 20 mg twice a day.  11.Brovana nebulizer twice a day.  12.Pulmicort nebulizer twice a day.  13.Ventolin inhaler p.r.n.  14.Diltiazem CD 240 daily.   LABORATORY DATA AND X-RAY FINDINGS:  White count 5.7, hemoglobin 9,  hematocrit 27.3, platelets 160.  Troponins are negative.  BNP is 671.  Alcohol level was negative.  INR at discharge is 1.2.   Chest x-ray on February 27, showed cardiomegaly, interstitial edema.  Telemetry shows the patient is in atrial fibrillation with controlled  ventricular response.   CONDITION ON DISCHARGE:  The patient is discharged in stable condition.   FOLLOW UP:  He will follow up with Dr. Tresa Endo in a couple weeks.  He will  need a CBC, BMP and pro time at followup.       Abelino Derrick, P.A.    ______________________________  Nicki Guadalajara, M.D.    Lenard Lance  D:  01/10/2008  T:  01/10/2008  Job:  [id number]   cc:   Alonna Buckler, M.D.  [facility]

## 2011-03-25 NOTE — Assessment & Plan Note (Signed)
[facility]                         GASTROENTEROLOGY OFFICE NOTE   NAME:APPLERaiford, Fetterman                        MRN:          [id number]  DATE:02/16/2008                            DOB:          12/04/1940    PROBLEM:  Gastrointestinal bleeding.   Mr. Cruces has returned following his hospitalization for severe anemia  and heme-positive stool.  He is on Coumadin for atrial fibrillation and  was found to be severely anemic with a hemoglobin of 9.  Gastric  erosions were seen on endoscopy.  This was felt to be the source for his  GI blood loss.  His last colonoscopy was in 2005.  He has been taking  Protonix since that time.  Hemoglobin has slowly dropped from a high of  10.5, most recently was 8.9 on February 14, 2008.  MCV was 83 and RDW was  19.  Mr. Wimberly has no GI complaints including abdominal pain and change  of bowel habits, melena or hematochezia.   He informed me that he is scheduled to undergo shock therapy for his  chronic atrial fibrillation.  He has to remain on Coumadin for one  month.   MEDICATIONS:  Allopurinol, Atenolol, lisinopril, Singulair, terazosin,  furosemide, Coumadin and Protonix.   ALLERGIES:  No known drug allergies.   PHYSICAL EXAMINATION:  VITAL SIGNS:  Pulse 60, blood pressure 110/60,  weight 156.  GENERAL APPEARANCE:  He is a healthy-appearing male.   IMPRESSION:  1. Heme-positive stool and slow gastrointestinal bleeding in the      setting of Coumadin anticoagulation.  Gastric erosions could still      be the source for his bleeding, though colonic source is also a      concern.  2. Atrial fibrillation - on Coumadin.  Patient is scheduled for shock      therapy.  3. Coronary artery disease.   RECOMMENDATIONS:  Repeat upper endoscopy (patient can stay on Coumadin).  I have chosen to do this test first since he does not have to  discontinue his Coumadin which will allow him to proceed with his  electroshock therapy  for his atrial fibrillation.  Unless a definitive  source for bleeding is seen, I will schedule him for colonoscopy after  we can hold his Coumadin.     Barbette Hair. [name], MD,FACG  Electronically Signed    RDK/MedQ  DD: 02/16/2008  DT: 02/16/2008  Job #: [id number]   cc:   Nanetta Batty, M.D.

## 2011-03-28 NOTE — Cardiovascular Report (Signed)
NAME:  Jason Larsen, Jason Larsen NO.:  [id number]   MEDICAL RECORD NO.:  [id number]          PATIENT TYPE:  INP   LOCATION:  6527                         FACILITY:  [facility]   PHYSICIAN:  Nicki Guadalajara, M.D.     DATE OF BIRTH:  1941-03-07   DATE OF PROCEDURE:  01/06/2007  DATE OF DISCHARGE:                            CARDIAC CATHETERIZATION   INDICATIONS:  Mr. Jason Larsen is a 70 year old male with a significant  tobacco history of approximately 60 years.  In 1997 he underwent CABG  surgery x4 at [facility] and had a LIMA placed to his LAD, a  sequential vein to the OM2 and OM3, and a vein to the PDA vessel of the  right coronary artery.  The patient developed atrial fibrillation at the  end of 2008.  An echocardiogram showed an ejection fraction to be 40% to  45% with septal hypokinesis and mild-to-moderate mitral regurgitation  with moderate pulmonary hypertension.  He had continued to have problems  with increasing shortness of breath.  Definitive cardiac catheterization  was recommended.   The patient had originally been scheduled to undergo this procedure  several days ago; however, upon presenting to the hospital his INR was  still 2.2 despite holding Coumadin for 4 days.  He was more pale, and  his hemoglobin was 9.0, having been 10.8 one week ago and 13  approximately 3 weeks ago.  His cardiac catheterization was therefore  deferred.  He was seen by GI.  He was transfused.  Endoscopy  demonstrated bleeding from gastric erosions.  He had initially been  placed on b.i.d. Protonix.  He did receive several additional units of  blood.  Again Coumadin had been held.  He also has severe COPD and had  been started on Spiriva 1 week ago.  He was also seen by pulmonary, and  pulmonary function studies demonstrated severe advanced COPD, stage IV,  with his FEV1 at 0.59, representing 23% of predicted.  This INR slowly  drifted down such that this morning it was 1.7, and by  later in the  morning it was 1.5.  He is now referred for catheterization.  He also  had been receiving Brovana nebulizer and  Xopenex.  He was not a  candidate for steroids secondary to his upper GI bleed and was felt to  be a high-risk patient in the event he would require redo CABG from a  pulmonary standpoint.  Definitive catheterization was performed.   PROCEDURE:  The patient was prepped and draped in the usual fashion.  His right femoral artery and right femoral vein were inserted, and a 7-  French venous sheath and 5-French arterial sheath were inserted without  difficulty.  A Swan-Ganz catheter was advanced to the RA and RV and  ultimately a 0.25 wire was necessary for the catheter to be advanced  into the right pulmonary artery.  Pressures were recorded.  O2  saturations were obtained.  Cardiac output was determined by the  thermodilution method and Fick method.  Hemoglobin was 9.1.  A pigtail  catheter was inserted and advanced  to the central aorta.  Simultaneous  PA and AO pressures were recorded.  Catheter was passed retrograde into  the left ventricle, and left ventricular pressures were recorded as well  as simultaneous LV PC pressures.  An O2 saturation had also been  obtained in the central aorta.  The pigtail catheter was used for  biplane selective arteriography.  Distal aortography was performed to  evaluate a renovascular etiology of hypertension as well as potential  for abdominal aortic aneurysm.  A 5-French FL-4 diagnostic catheter was  used for selective angiography in the native left system.  A 5-French FR-  4 catheter was used for selective angiography in the native right  coronary artery.  This catheter was also used for selective angiography  into the vein graft, sequentially supplying the OM1 and OM2 vessel.  A  LIMA catheter was used for selective angiography into the left internal  mammary artery supplying the LAD, and a 5-French right bypass catheter  was  necessary for selective angiography in the vein graft supplying the  right coronary artery.  The patient tolerated the procedure well.  He  returned to his room in satisfactory condition.   HEMODYNAMIC DATA:  Right atrial mean pressure 17.  Right ventricular  pressure 50/14.  Pulmonary artery pressure 50/20, mean 37, and pulmonary  capillary wedge pressure mean 22, V-wave 28.   Central aortic pressure is 115/66.  Left ventricle pressure is 115/22.  O2 saturation in the central aorta was 97%.  The pulmonary artery was  55%.   Cardiac output by the thermodilution method was 3.4 and by the Fick  method it was 4.6 liters per minute.  Cardiac index was 1.8 and 2.5  liters per meter squared, respectively.   Biplane selective arteriography revealed mild LV dysfunction with an  ejection fraction of approximately 45%.  There was focal mid inferior  hypocontractility on the RAO projection and a moderate hypocontractility  involving the inferoapical to low posterolateral wall in the LAO  projection.  There was 3+ angiographic mitral regurgitation.   Distal aortography demonstrated 60% somewhat eccentric focal left  proximal renal artery stenosis.  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  There  was luminal irregularity of both iliacs with 40% narrowing in the right  internal iliac and 30% narrowing in the left internal iliac.   CORONARY ANGIOGRAPHY:  There was extensive coronary calcification  involving the left main  LAD system as well as the right coronary  system.   The left main was not well-visualized and almost appeared subtotally  occluded and gave rise to apparent LAD and circumflex system.  It was  hard to tell.  There was only minimal filling.  It was hard to tell if  this was due to significant calcification or thrombus.   The LAD was totally  occluded proximally, but there was some filling of a  moderate-sized diagonal vessel.  The circumflex vessel was totally  occluded near the ostium after giving rise to a diminutive branch.   The native right coronary artery was tortuous, had 95% proximal  stenosis, and after a small anterior RV marginal branch was diffusely  narrowed at 95% to 99%.  Faint flush and fill phenomena was seen  distally.   The left internal mammary artery graft supplying the LAD was widely  patent.  The LAD extended to the apex.  There  also was retrograde  filling up to the point of proximal occlusion.   The vein graft sequentially supplying the OM1 and OM2 vessel was widely  patent.   The vein graft supplying the mid PDA was widely patent.  There was faint  collateralization of the distal AV groove circumflex from the PLA distal  RCA vessels.   IMPRESSION:  1. Moderate left ventricular dysfunction with ejection fraction of      approximately 45% with moderate hypocontractility involving the      inferoapical to low posterolateral wall.  2. There is 3+ angiographic mitral regurgitation.  3. Moderate pulmonary hypertension with pulmonary artery systolic      pressure at 15 mm.  4. Severe native coronary obstructive disease with subtotal occlusion      of the left main, total proximal LAD occlusion; total proximal      circumflex occlusion; diffusely diseased native right coronary      artery with 95% proximal stenosis followed by diffuse 99% stenosis.      Patent LIMA to the LAD.  Patent sequential vein graft to the OM1      and OM2.  Patent vein graft to the PDA with evidence for      collateralization of the distal AV groove circumflex via the distal      right coronary artery via this vein graft.  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   RECOMMENDATIONS:  Medical therapy.           ______________________________  Nicki Guadalajara, M.D.     TK/MEDQ  D:  01/07/2008  T:  01/08/2008  Job:  [id number]   cc:   Nicki Guadalajara, M.D.  Dr. Doretha Sou

## 2011-05-19 ENCOUNTER — Other Ambulatory Visit: Payer: Self-pay | Admitting: *Deleted

## 2011-05-19 MED ORDER — AMIODARONE HCL 200 MG PO TABS: 200.0000 mg | ORAL_TABLET | Freq: Every day | ORAL | Status: DC

## 2011-08-01 LAB — COMPREHENSIVE METABOLIC PANEL
Albumin: 2.9 — ABNORMAL LOW
BUN: 31 — ABNORMAL HIGH
Chloride: 102
Creatinine, Ser: 0.76
Glucose, Bld: 139 — ABNORMAL HIGH
Total Bilirubin: 0.7
Total Protein: 5.6 — ABNORMAL LOW

## 2011-08-01 LAB — CBC
HCT: 25.3 — ABNORMAL LOW
HCT: 25.9 — ABNORMAL LOW
HCT: 27.5 — ABNORMAL LOW
HCT: 27.7 — ABNORMAL LOW
HCT: 27.9 — ABNORMAL LOW
HCT: 29.5 — ABNORMAL LOW
HCT: 29.7 — ABNORMAL LOW
Hemoglobin: 10 — ABNORMAL LOW
Hemoglobin: 8.2 — ABNORMAL LOW
Hemoglobin: 8.6 — ABNORMAL LOW
Hemoglobin: 9.1 — ABNORMAL LOW
Hemoglobin: 9.6 — ABNORMAL LOW
MCHC: 32.8
MCHC: 32.9
MCV: 92.6
MCV: 92.8
MCV: 93.4
MCV: 94
MCV: 94.9
Platelets: 148 — ABNORMAL LOW
Platelets: 149 — ABNORMAL LOW
Platelets: 153
Platelets: 163
Platelets: 173
Platelets: 175
Platelets: 180
RBC: 3 — ABNORMAL LOW
RBC: 3.22 — ABNORMAL LOW
RDW: 14.6
RDW: 14.8
RDW: 15.3
RDW: 15.5
RDW: 15.5
RDW: 15.6 — ABNORMAL HIGH
RDW: 15.7 — ABNORMAL HIGH
RDW: 15.7 — ABNORMAL HIGH
WBC: 5.4
WBC: 5.8
WBC: 5.9
WBC: 6.4
WBC: 6.8
WBC: 7.8

## 2011-08-01 LAB — BLOOD GAS, ARTERIAL
Acid-Base Excess: 2.3 — ABNORMAL HIGH
Drawn by: 279641
FIO2: 0.21
O2 Content: 4
O2 Saturation: 93.2
Patient temperature: 98.6
Patient temperature: 98.6
pCO2 arterial: 41.6
pH, Arterial: 7.429
pO2, Arterial: 71.2 — ABNORMAL LOW

## 2011-08-01 LAB — CARDIAC PANEL(CRET KIN+CKTOT+MB+TROPI)
CK, MB: 1.2
Relative Index: INVALID
Total CK: 24
Troponin I: 0.01

## 2011-08-01 LAB — BASIC METABOLIC PANEL
BUN: 19
BUN: 29 — ABNORMAL HIGH
CO2: 31
Calcium: 8.8
Chloride: 100
Chloride: 101
Creatinine, Ser: 0.73
GFR calc Af Amer: >60
GFR calc non Af Amer: >60
GFR calc non Af Amer: >60
Glucose, Bld: 130 — ABNORMAL HIGH
Glucose, Bld: 99
Potassium: 4.3
Potassium: 4.4
Sodium: 137
Sodium: 137

## 2011-08-01 LAB — POCT I-STAT 3, VENOUS BLOOD GAS (G3P V)
Acid-Base Excess: 5 — ABNORMAL HIGH
Bicarbonate: 30.2 — ABNORMAL HIGH
Operator id: 151231
pH, Ven: 7.392 — ABNORMAL HIGH

## 2011-08-01 LAB — CROSSMATCH
ABO/RH(D): A POS
Antibody Screen: NEGATIVE

## 2011-08-01 LAB — PROTIME-INR
INR: 1.3
INR: 1.5
INR: 1.7 — ABNORMAL HIGH
Prothrombin Time: 16.8 — ABNORMAL HIGH
Prothrombin Time: 22.7 — ABNORMAL HIGH

## 2011-08-01 LAB — POCT I-STAT 3, ART BLOOD GAS (G3+)
O2 Saturation: 97
pCO2 arterial: 41.8
pH, Arterial: 7.43
pO2, Arterial: 89

## 2011-08-01 LAB — B-NATRIURETIC PEPTIDE (CONVERTED LAB): Pro B Natriuretic peptide (BNP): 396 — ABNORMAL HIGH

## 2011-08-01 LAB — ABO/RH: ABO/RH(D): A POS

## 2011-08-04 LAB — CBC
Hemoglobin: 8.6 — ABNORMAL LOW
MCHC: 32.9
MCHC: 32.9
MCV: 92.2
Platelets: 142 — ABNORMAL LOW
Platelets: 160
RDW: 15.4
WBC: 5.7

## 2011-08-04 LAB — PROTIME-INR
INR: 1.2
INR: 1.3
Prothrombin Time: 15.5 — ABNORMAL HIGH

## 2011-08-04 LAB — BASIC METABOLIC PANEL
BUN: 10
BUN: 14
CO2: 29
CO2: 35 — ABNORMAL HIGH
Calcium: 8.7
Calcium: 8.7
Chloride: 95 — ABNORMAL LOW
Creatinine, Ser: 0.88
GFR calc non Af Amer: >60
Glucose, Bld: 122 — ABNORMAL HIGH
Sodium: 137

## 2011-08-05 LAB — BASIC METABOLIC PANEL
BUN: 14
CO2: 31
Calcium: 9
Creatinine, Ser: 0.68
Glucose, Bld: 139 — ABNORMAL HIGH

## 2011-08-05 LAB — DIFFERENTIAL
Basophils Absolute: 0
Basophils Relative: 1
Eosinophils Absolute: 0.2
Eosinophils Relative: 3
Lymphocytes Relative: 18
Monocytes Absolute: 0.6

## 2011-08-05 LAB — CBC
HCT: 26.4 — ABNORMAL LOW
Hemoglobin: 8.9 — ABNORMAL LOW
MCHC: 33.7
MCV: 81.1
RDW: 18.8 — ABNORMAL HIGH

## 2011-08-05 LAB — CARDIAC PANEL(CRET KIN+CKTOT+MB+TROPI)
Total CK: 34
Troponin I: 0.02

## 2011-08-27 ENCOUNTER — Ambulatory Visit: Payer: Self-pay | Admitting: Urology

## 2011-09-03 ENCOUNTER — Ambulatory Visit: Payer: Self-pay | Admitting: Urology

## 2011-09-22 ENCOUNTER — Other Ambulatory Visit: Payer: Self-pay

## 2011-09-22 ENCOUNTER — Other Ambulatory Visit: Payer: Self-pay | Admitting: Cardiovascular Disease

## 2011-09-22 MED ORDER — DILTIAZEM HCL ER COATED BEADS 240 MG PO CP24: 240.0000 mg | ORAL_CAPSULE | Freq: Every day | ORAL | Status: DC

## 2011-09-23 ENCOUNTER — Telehealth: Payer: Self-pay

## 2011-09-23 NOTE — Telephone Encounter (Signed)
Spoke with pharmacist at [facility]; told not to refill the diltiazem and to please contact [facility] for refills on all medications.  The patient does not come to see Dr. Mariah Milling at [facility].  The pharmacist will discontinue the refill request as denied.

## 2011-11-05 ENCOUNTER — Encounter: Payer: Self-pay | Admitting: Internal Medicine

## 2011-11-05 ENCOUNTER — Telehealth: Payer: Self-pay | Admitting: Critical Care Medicine

## 2011-11-05 NOTE — Telephone Encounter (Signed)
Pt c/o runny nose, head congestion and cough. Mucus is clear. Symptoms since Mon., 12/24. Pt last seen for OV in 11/2010. Pt agrees to be seen for Ov with MW on Thurs., 12/27 @ 11am.

## 2011-11-06 ENCOUNTER — Encounter: Payer: Self-pay | Admitting: Internal Medicine

## 2011-11-06 ENCOUNTER — Ambulatory Visit (INDEPENDENT_AMBULATORY_CARE_PROVIDER_SITE_OTHER): Payer: Medicare Other | Admitting: Internal Medicine

## 2011-11-06 ENCOUNTER — Ambulatory Visit (INDEPENDENT_AMBULATORY_CARE_PROVIDER_SITE_OTHER)
Admission: RE | Admit: 2011-11-06 | Discharge: 2011-11-06 | Disposition: A | Discharge status: Home / Self Care | Payer: Medicare Other | Source: Ambulatory Visit | Attending: Internal Medicine | Admitting: Internal Medicine

## 2011-11-06 VITALS — BP 112/72 | HR 72 | Temp 97.5°F | Ht 65.0 in | Wt 161.4 lb

## 2011-11-06 DIAGNOSIS — J449 Chronic obstructive pulmonary disease, unspecified: Secondary | ICD-10-CM

## 2011-11-06 DIAGNOSIS — F17200 Nicotine dependence, unspecified, uncomplicated: Secondary | ICD-10-CM | POA: Insufficient documentation

## 2011-11-06 MED ORDER — PREDNISONE (PAK) 10 MG PO TABS: ORAL_TABLET | ORAL | Status: AC

## 2011-11-06 MED ORDER — BUDESONIDE-FORMOTEROL FUMARATE 160-4.5 MCG/ACT IN AERO: INHALATION_SPRAY | RESPIRATORY_TRACT | Status: DC

## 2011-11-06 NOTE — Progress Notes (Signed)
Subjective:     Patient ID: Jason Larsen, male   DOB: 16-Nov-1940, 70 y.o.   MRN: [id number]  HPI  37 yowm active smoker followed by Dr Jason Larsen last seen 11/2010 with rec to stop smoking and return in 6 weeks    11/06/2011 Jason Larsen/ acute w/in ov cc much worse  since 12/24 but prior to that was using albuterol daily at baseline esp in am in neb form as a maintenance med then "got a chest cold" with thick white mucus increase over baseline vol/ thickness and increasing sob at rest since.  ROS  At present neg for  any significant sore throat, dysphagia, itching, sneezing,  nasal congestion or excess/ purulent secretions,  fever, chills, sweats, unintended wt loss, pleuritic or exertional cp, hempoptysis, orthopnea pnd or leg swelling.  Also denies presyncope, palpitations, heartburn, abdominal pain, nausea, vomiting, diarrhea  or change in bowel or urinary habits, dysuria,hematuria,  rash, arthralgias, visual complaints, headache, numbness weakness or ataxia.     Also denies any obvious fluctuation of symptoms with weather or environmental changes or other aggravating or alleviating factors except as outlined above    Review of Systems     Objective:   Physical Exam Elderly wm mod anxious but no increase wob at rest ra with borderline sats noted HEENT mild turbinate edema.  Oropharynx no thrush or excess pnd or cobblestoning.  No JVD or cervical adenopathy. Mild accessory muscle hypertrophy. Trachea midline, nl thryroid. Chest was hyperinflated by percussion with diminished breath sounds and moderate increased exp time without wheeze. Hoover sign positive at mid inspiration. Regular rate and rhythm without murmur gallop or rub or increase P2 or edema.  Abd: no hsm, nl excursion. Ext warm without cyanosis or clubbing.   CXR  11/06/2011 :  Emphysematous change without definite superimposed acute cardiopulmonary process. 2. Stable sequela of prior granulomatous infection     Assessment:         Plan:

## 2011-11-06 NOTE — Assessment & Plan Note (Addendum)
DDX of  difficult airways managment all start with A and  include Adherence, Ace Inhibitors, Acid Reflux, Active Sinus Disease, Alpha 1 Antitripsin deficiency, Anxiety masquerading as Airways dz,  ABPA,  allergy(esp in young), Aspiration (esp in elderly), Adverse effects of DPI,  Active smokers, plus two Bs  = Bronchiectasis and Beta blocker use..and one C= CHF  In this case Adherence is the biggest issue and starts with  inability to use HFA effectively and also  understand that SABA treats the symptoms but doesn't get to the underlying problem (inflammation).  I used  the analogy of putting steroid cream on a rash to help explain the meaning of topical therapy and the need to get the drug to the target tissue.  The proper method of use, as well as anticipated side effects, of this metered-dose inhaler are discussed and demonstrated to the patient. Improved to 75% with coaching and has failed asthanex at this point so try symbicort 160 2bid and use spiriva first thing in am , not p lunch    Active smoking > discussed separately   Prednisone 10 mg take  4 each am x 2 days,   2 each am x 2 days,  1 each am x2days and stop

## 2011-11-06 NOTE — Assessment & Plan Note (Signed)
>   3 min discussion  I took an extended  opportunity with this patient to outline the consequences of continued cigarette use  in airway disorders based on all the data we have from the multiple national lung health studies (perfomed over decades at millions of dollars in cost)  indicating that smoking cessation, not choice of inhalers or physicians, is the most important aspect of care.   

## 2011-11-06 NOTE — Patient Instructions (Signed)
Symbicort Take 2 puffs first thing in am and then another 2 puffs about 12 hours later.   Take spiriva immediately after symbicort each am   Stop asthmanex  Prednisone 10 mg take  4 each am x 2 days,   2 each am x 2 days,  1 each am x2days and stop   Work on inhaler technique:  relax and gently blow all the way out then take a nice smooth deep breath back in, triggering the inhaler at same time you start breathing in.  Hold for up to 5 seconds if you can.  Rinse and gargle with water when done   If your mouth or throat starts to bother you,   I suggest you time the inhaler to your dental care and after using the inhaler(s) brush teeth and tongue with a baking soda containing toothpaste and when you rinse this out, gargle with it first to see if this helps your mouth and throat.     Only use your albuterol(nebulizer or spray) as a rescue medication to be used if you can't catch your breath by resting or doing a relaxed purse lip breathing pattern. The less you use it, the better it will work when you need it.    Please schedule a follow up office visit in 2 weeks, sooner if needed to see to Dr Delford Field to regroup with your maintenance program

## 2011-11-07 NOTE — Progress Notes (Signed)
Quick Note:  Spoke with pt and notified of results per Dr. Wert. Pt verbalized understanding and denied any questions.  ______ 

## 2011-11-11 HISTORY — PX: NM MYOCAR MULTIPLE W/SPECT: HXRAD626

## 2011-11-20 ENCOUNTER — Encounter: Payer: Self-pay | Admitting: Adult Health

## 2011-11-20 ENCOUNTER — Ambulatory Visit: Payer: Medicare Other | Admitting: Adult Health

## 2011-11-20 ENCOUNTER — Ambulatory Visit (INDEPENDENT_AMBULATORY_CARE_PROVIDER_SITE_OTHER): Payer: Medicare Other | Admitting: Adult Health

## 2011-11-20 VITALS — BP 118/62 | HR 80 | Temp 98.5°F | Ht 65.0 in | Wt 157.2 lb

## 2011-11-20 DIAGNOSIS — J449 Chronic obstructive pulmonary disease, unspecified: Secondary | ICD-10-CM

## 2011-11-20 MED ORDER — DOXYCYCLINE HYCLATE 100 MG PO TABS: 100.0000 mg | ORAL_TABLET | Freq: Two times a day (BID) | ORAL | Status: AC

## 2011-11-20 MED ORDER — PREDNISONE 10 MG PO TABS: ORAL_TABLET | ORAL | Status: DC

## 2011-11-20 NOTE — Assessment & Plan Note (Signed)
Slow to resolve exacerbation   Plan:  Doxycycline 100mg  Twice daily  For 7 days  Prednsione taper over next week.  Mucinex DM Twice daily  As needed  Cough/congestion  Fluids and rest.  Please contact office for sooner follow up if symptoms do not improve or worsen or seek emergency care  follow up Dr. Delford Field  In 2 months

## 2011-11-20 NOTE — Patient Instructions (Signed)
Doxycycline 100mg  Twice daily  For 7 days  Prednsione taper over next week.  Mucinex DM Twice daily  As needed  Cough/congestion  Fluids and rest.  Please contact office for sooner follow up if symptoms do not improve or worsen or seek emergency care  follow up Dr. Delford Field  In 2 months

## 2011-11-20 NOTE — Progress Notes (Signed)
Subjective:     Patient ID: Jason Larsen, male   DOB: 05/22/1941, 71 y.o.   MRN: [id number]  HPI  36 yowm active smoker followed by Dr Delford Field last seen 11/2010 with rec to stop smoking and return in 6 weeks  11/06/2011 Wert/ acute w/in ov cc much worse  since 12/24 but prior to that was using albuterol daily at baseline esp in am in neb form as a maintenance med then "got a chest cold" with thick white mucus increase over baseline vol/ thickness and increasing sob at rest since. >>changed from asmanex to symbicort , prednisone taper   11/20/2011 Follow up  Returns for follow up . Complains of persistent symptoms . pt states fatigue,nasal drainage ,ears stopped up ,prouductive cough,sob and wheezing. Feels some better but not good enough.  Cough and wheezing are continuing. More dyspnea than usual.  No hemoptysis or edema. Good appetite.    Review of Systems Constitutional:   No  weight loss, night sweats,  Fevers, chills,  +fatigue, or  lassitude.  HEENT:   No headaches,  Difficulty swallowing,  Tooth/dental problems, or  Sore throat,                No sneezing, itching, ear ache, nasal congestion, post nasal drip,   CV:  No chest pain,  Orthopnea, PND, swelling in lower extremities, anasarca, dizziness, palpitations, syncope.   GI  No heartburn, indigestion, abdominal pain, nausea, vomiting, diarrhea, change in bowel habits, loss of appetite, bloody stools.   Resp:   No coughing up of blood.     No chest wall deformity  Skin: no rash or lesions.  GU: no dysuria, change in color of urine, no urgency or frequency.  No flank pain, no hematuria   MS:  No joint pain or swelling.  No decreased range of motion.  No back pain.  Psych:  No change in mood or affect. No depression or anxiety.  No memory loss.         Objective:   Physical Exam Elderly wm   GEN: A/Ox3; pleasant , NAD, elderly   HEENT:  [place]/AT,  EACs-clear, TMs-wnl, NOSE-clear, THROAT-clear, no lesions, no postnasal  drip or exudate noted.   NECK:  Supple w/ fair ROM; no JVD; normal carotid impulses w/o bruits; no thyromegaly or nodules palpated; no lymphadenopathy.  RESP  Coarse BS w/ faint exp wheezing, no accessory muscle use, no dullness to percussion  CARD:  RRR, no m/r/g  , no peripheral edema, pulses intact, no cyanosis or clubbing.  GI:   Soft & nt; nml bowel sounds; no organomegaly or masses detected.  Musco: Warm bil, no deformities or joint swelling noted.   Neuro: alert, no focal deficits noted.    Skin: Warm, no lesions or rashes   CXR  11/06/2011 :  Emphysematous change without definite superimposed acute cardiopulmonary process. 2. Stable sequela of prior granulomatous infection     Assessment:       Plan:

## 2012-01-20 ENCOUNTER — Ambulatory Visit: Payer: Medicare Other | Admitting: Critical Care Medicine

## 2012-01-30 ENCOUNTER — Ambulatory Visit (INDEPENDENT_AMBULATORY_CARE_PROVIDER_SITE_OTHER): Payer: Medicare Other | Admitting: Critical Care Medicine

## 2012-01-30 ENCOUNTER — Encounter: Payer: Self-pay | Admitting: Critical Care Medicine

## 2012-01-30 VITALS — BP 100/60 | HR 70 | Temp 98.1°F | Ht 65.0 in | Wt 159.4 lb

## 2012-01-30 DIAGNOSIS — J449 Chronic obstructive pulmonary disease, unspecified: Secondary | ICD-10-CM

## 2012-01-30 NOTE — Assessment & Plan Note (Signed)
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

## 2012-01-30 NOTE — Progress Notes (Signed)
Subjective:    Patient ID: Jason Larsen, male    DOB: 10/31/1941, 71 y.o.   MRN: [id number]  HPI 39 yowm active smoker followed by Dr Delford Field last seen 11/2010 with rec to stop smoking and return in 6 weeks  11/06/2011 Wert/ acute w/in ov cc much worse  since 12/24 but prior to that was using albuterol daily at baseline esp in am in neb form as a maintenance med then "got a chest cold" with thick white mucus increase over baseline vol/ thickness and increasing sob at rest since. >>changed from asmanex to symbicort , prednisone taper   11/20/2011 Follow up  Returns for follow up . Complains of persistent symptoms . pt states fatigue,nasal drainage ,ears stopped up ,prouductive cough,sob and wheezing. Feels some better but not good enough.  Cough and wheezing are continuing. More dyspnea than usual.  No hemoptysis or edema. Good appetite.   01/30/2012 Pt doing well.  Now no cough .  No chest pain.  Dyspnea is at baseline.  No edema in feet Pt denies any significant sore throat, nasal congestion or excess secretions, fever, chills, sweats, unintended weight loss, pleurtic or exertional chest pain, orthopnea PND, or leg swelling Pt denies any increase in rescue therapy over baseline, denies waking up needing it or having any early am or nocturnal exacerbations of coughing/wheezing/or dyspnea. Pt also denies any obvious fluctuation in symptoms with  weather or environmental change or other alleviating or aggravating factors      Review of Systems Constitutional:   No  weight loss, night sweats,  Fevers, chills, fatigue, lassitude. HEENT:   No headaches,  Difficulty swallowing,  Tooth/dental problems,  Sore throat,                No sneezing, itching, ear ache, nasal congestion, post nasal drip,   CV:  No chest pain,  Orthopnea, PND, swelling in lower extremities, anasarca, dizziness, palpitations  GI  No heartburn, indigestion, abdominal pain, nausea, vomiting, diarrhea, change in bowel  habits, loss of appetite  Resp: Notes shortness of breath with exertion not  at rest.  No excess mucus, no productive cough,  No non-productive cough,  No coughing up of blood.  No change in color of mucus.  No wheezing.  No chest wall deformity  Skin: no rash or lesions.  GU: no dysuria, change in color of urine, no urgency or frequency.  No flank pain.  MS:  No joint pain or swelling.  No decreased range of motion.  No back pain.  Psych:  No change in mood or affect. No depression or anxiety.  No memory loss.     Objective:   Physical Exam  Filed Vitals:   01/30/12 1111  BP: 100/60  Pulse: 70  Temp: 98.1 F (36.7 C)  TempSrc: Oral  Height: 5\' 5"  (1.651 m)  Weight: 159 lb 6.4 oz (72.303 kg)  SpO2: 95%    Gen: Pleasant, well-nourished, in no distress,  normal affect  ENT: No lesions,  mouth clear,  oropharynx clear, no postnasal drip  Neck: No JVD, no TMG, no carotid bruits  Lungs: No use of accessory muscles, no dullness to percussion, distant BS  Cardiovascular: RRR, heart sounds normal, no murmur or gallops, no peripheral edema  Abdomen: soft and NT, no HSM,  BS normal  Musculoskeletal: No deformities, no cyanosis or clubbing  Neuro: alert, non focal  Skin: Warm, no lesions or rashes  No results found.       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 albuterol (PROVENTIL) (2.5 MG/3ML) 0.083% nebulizer solution Take 2.5 mg by nebulization every 6 (six) hours as needed.        Marland Kitchen allopurinol (ZYLOPRIM) 300 MG tablet 1/2 tab by mouth once daily       . amiodarone (PACERONE) 200 MG tablet Take 1 tablet (200 mg total) by mouth daily.  30  tablet  11  . aspirin 81 MG tablet Take 81 mg by mouth daily.        . budesonide-formoterol (SYMBICORT) 160-4.5 MCG/ACT inhaler Take 2 puffs first thing in am and then another 2 puffs about 12 hours later.         . carbonyl iron (FEOSOL) 45 MG TABS Take 45 mg by mouth 2 (two) times daily with a meal.        . diltiazem (CARDIZEM CD) 240 MG 24 hr capsule TAKE ONE CAPSULE BY MOUTH EVERY DAY  90 capsule  0  . furosemide (LASIX) 40 MG tablet 1/2 tab by mouth once daily       . losartan (COZAAR) 50 MG tablet Take 50 mg by mouth daily.        . nebivolol (BYSTOLIC) 5 MG tablet Take 5 mg by mouth daily.        Marland Kitchen omeprazole (PRILOSEC) 20 MG capsule 2 by mouth 30 minutes before first meal of the day       . simvastatin (ZOCOR) 20 MG tablet 1/2 by mouth daily at bedtime       . tiotropium (SPIRIVA) 18 MCG inhalation capsule Place 18 mcg into inhaler and inhale daily.        Marland Kitchen warfarin (COUMADIN) 1 MG tablet Take 1 mg by mouth daily.        Marland Kitchen DISCONTD: Phenyleph-Pyril-DM-APAP (ROBITUSSIN NIGHT RELIEF PO) Per bottle directions as needed       . DISCONTD: predniSONE (DELTASONE) 10 MG tablet 4 tabs for 2 days, then 3 tabs for 2 days, 2 tabs for 2 days, then 1 tab for 2 days, then stop  20 tablet  0

## 2012-01-30 NOTE — Patient Instructions (Signed)
No change in medications. Return in   3 months 

## 2012-05-24 ENCOUNTER — Other Ambulatory Visit: Payer: Self-pay | Admitting: Cardiovascular Disease

## 2012-06-01 ENCOUNTER — Other Ambulatory Visit: Payer: Self-pay | Admitting: Cardiovascular Disease

## 2012-08-30 ENCOUNTER — Ambulatory Visit (INDEPENDENT_AMBULATORY_CARE_PROVIDER_SITE_OTHER): Payer: Medicare Other | Admitting: Adult Health

## 2012-08-30 ENCOUNTER — Encounter: Payer: Self-pay | Admitting: Adult Health

## 2012-08-30 VITALS — BP 106/74 | HR 70 | Temp 97.1°F | Ht 64.0 in | Wt 161.2 lb

## 2012-08-30 DIAGNOSIS — J449 Chronic obstructive pulmonary disease, unspecified: Secondary | ICD-10-CM

## 2012-08-30 MED ORDER — PREDNISONE 10 MG PO TABS: ORAL_TABLET | ORAL | Status: DC

## 2012-08-30 MED ORDER — AMOXICILLIN-POT CLAVULANATE 875-125 MG PO TABS: 1.0000 | ORAL_TABLET | Freq: Two times a day (BID) | ORAL | Status: AC

## 2012-08-30 NOTE — Assessment & Plan Note (Signed)
Flare   Plan Augmentin 875mg  Twice daily  For 7 days  Prednsione taper over next week.  Mucinex DM Twice daily  As needed  Cough/congestion  Fluids and rest.  Please contact office for sooner follow up if symptoms do not improve or worsen or seek emergency care  follow up Dr. Delford Field  In 1 month

## 2012-08-30 NOTE — Patient Instructions (Addendum)
Augmentin 875mg  Twice daily  For 7 days  Prednsione taper over next week.  Mucinex DM Twice daily  As needed  Cough/congestion  Fluids and rest.  Please contact office for sooner follow up if symptoms do not improve or worsen or seek emergency care  follow up Dr. Delford Field  In 1 month

## 2012-08-30 NOTE — Progress Notes (Signed)
  Subjective:    Patient ID: Jason Larsen, male    DOB: 12-18-1940, 71 y.o.   MRN: [id number]  HPI 70 yowm active smoker followed by Dr Delford Field last seen 11/2010 with rec to stop smoking and return in 6 weeks  11/06/2011 Wert/ acute w/in ov cc much worse  since 12/24 but prior to that was using albuterol daily at baseline esp in am in neb form as a maintenance med then "got a chest cold" with thick white mucus increase over baseline vol/ thickness and increasing sob at rest since. >>changed from asmanex to symbicort , prednisone taper   11/20/2011 Follow up  Returns for follow up . Complains of persistent symptoms . pt states fatigue,nasal drainage ,ears stopped up ,prouductive cough,sob and wheezing. Feels some better but not good enough.  Cough and wheezing are continuing. More dyspnea than usual.  No hemoptysis or edema. Good appetite.   01/30/12  Pt doing well.  Now no cough .  No chest pain.  Dyspnea is at baseline.  No edema in feet  08/30/2012 Acute OV  Complains of chest congestion, dry cough, increased SOB, weakness/body aches, chills x 5 days. Cough is congested but can not get up mucus. No hemoptysis or chest pain. No fever or edema  OTC mucinex not helping.  Taking symbicort and spiriva without problems.      Review of Systems  Constitutional:   No  weight loss, night sweats,  Fevers, chills, + fatigue, lassitude. HEENT:   No headaches,  Difficulty swallowing,  Tooth/dental problems,  Sore throat,                No sneezing, itching, ear ache, nasal congestion, post nasal drip,   CV:  No chest pain,  Orthopnea, PND, swelling in lower extremities, anasarca, dizziness, palpitations  GI  No heartburn, indigestion, abdominal pain, nausea, vomiting, diarrhea, change in bowel habits, loss of appetite  Resp: Notes shortness of breath with exertion not  at rest.   No wheezing.  No chest wall deformity  Skin: no rash or lesions.  GU: no dysuria, change in color of urine, no  urgency or frequency.  No flank pain.  MS:  No joint pain or swelling.  No decreased range of motion.  No back pain.  Psych:  No change in mood or affect. No depression or anxiety.  No memory loss.     Objective:   Physical Exam   Filed Vitals:   08/30/12 1150  BP: 106/74  Pulse: 70  Temp: 97.1 F (36.2 C)  TempSrc: Oral  Height: 5\' 4"  (1.626 m)  Weight: 161 lb 3.2 oz (73.12 kg)  SpO2: 95%    Gen: Pleasant, well-nourished, in no distress,  normal affect  ENT: No lesions,  mouth clear,  oropharynx clear, no postnasal drip  Neck: No JVD, no TMG, no carotid bruits  Lungs: No use of accessory muscles, no dullness to percussion , coarse BS w/ few exp wheezes   Cardiovascular: RRR, heart sounds normal, no murmur or gallops, no peripheral edema  Abdomen: soft and NT, no HSM,  BS normal  Musculoskeletal: No deformities, no cyanosis or clubbing  Neuro: alert, non focal  Skin: Warm, no lesions or rashes         Assessment & Plan:

## 2012-09-23 ENCOUNTER — Other Ambulatory Visit (HOSPITAL_COMMUNITY): Payer: Self-pay | Admitting: Cardiovascular Disease

## 2012-09-23 DIAGNOSIS — I701 Atherosclerosis of renal artery: Secondary | ICD-10-CM

## 2012-09-30 ENCOUNTER — Encounter: Payer: Self-pay | Admitting: Critical Care Medicine

## 2012-09-30 ENCOUNTER — Ambulatory Visit (INDEPENDENT_AMBULATORY_CARE_PROVIDER_SITE_OTHER): Payer: Medicare Other | Admitting: Critical Care Medicine

## 2012-09-30 ENCOUNTER — Encounter (HOSPITAL_COMMUNITY): Payer: Medicare Other

## 2012-09-30 VITALS — BP 110/74 | HR 97 | Temp 97.3°F | Ht 64.0 in | Wt 160.0 lb

## 2012-09-30 DIAGNOSIS — J449 Chronic obstructive pulmonary disease, unspecified: Secondary | ICD-10-CM

## 2012-09-30 DIAGNOSIS — F17200 Nicotine dependence, unspecified, uncomplicated: Secondary | ICD-10-CM

## 2012-09-30 MED ORDER — CEFUROXIME AXETIL 500 MG PO TABS: 500.0000 mg | ORAL_TABLET | Freq: Two times a day (BID) | ORAL | Status: DC

## 2012-09-30 MED ORDER — PREDNISONE 10 MG PO TABS: ORAL_TABLET | ORAL | Status: DC

## 2012-09-30 NOTE — Patient Instructions (Addendum)
Take cefuroxime one twice daily for 10days Prednisone 10mg  Take 4 for three days 3 for three days 2 for three days 1 for three days and stop Focus on stopping or limiting smoking No other medication changes Return 1 month

## 2012-09-30 NOTE — Progress Notes (Signed)
Subjective:    Patient ID: Jason Larsen, male    DOB: 1941-03-08, 71 y.o.   MRN: [id number]  HPI 46 yowm active smoker followed by Dr Delford Field last seen 11/2010 with rec to stop smoking and return in 6 weeks  11/06/2011 Wert/ acute w/in ov cc much worse  since 12/24 but prior to that was using albuterol daily at baseline esp in am in neb form as a maintenance med then "got a chest cold" with thick white mucus increase over baseline vol/ thickness and increasing sob at rest since. >>changed from asmanex to symbicort , prednisone taper   11/20/2011 Follow up  Returns for follow up . Complains of persistent symptoms . pt states fatigue,nasal drainage ,ears stopped up ,prouductive cough,sob and wheezing. Feels some better but not good enough.  Cough and wheezing are continuing. More dyspnea than usual.  No hemoptysis or edema. Good appetite.   01/30/12  Pt doing well.  Now no cough .  No chest pain.  Dyspnea is at baseline.  No edema in feet  10/21 Acute OV  Complains of chest congestion, dry cough, increased SOB, weakness/body aches, chills x 5 days. Cough is congested but can not get up mucus. No hemoptysis or chest pain. No fever or edema  OTC mucinex not helping.  Taking symbicort and spiriva without problems.   09/30/2012 Pt not better compared to 10/21 OV.  No real changes with ABX/PRed Now: URI symptoms, more cough, feels weak and tired.  Mucus is white.  No blood.  No real chest pain.  Notes some wheezing.  Pt denies any significant sore throat, nasal congestion or excess secretions, fever, chills, sweats, unintended weight loss, pleurtic or exertional chest pain, orthopnea PND, or leg swelling Pt denies any increase in rescue therapy over baseline, denies waking up needing it or having any early am or nocturnal exacerbations of coughing/wheezing/or dyspnea. Pt also denies any obvious fluctuation in symptoms with  weather or environmental change or other alleviating or aggravating  factors  CAT Score 09/30/2012  Total CAT Score 13        Review of Systems  Constitutional:   No  weight loss, night sweats,  Fevers, chills, + fatigue, lassitude. HEENT:   No headaches,  Difficulty swallowing,  Tooth/dental problems,  Sore throat,                No sneezing, itching, ear ache, nasal congestion, post nasal drip,   CV:  No chest pain,  Orthopnea, PND, swelling in lower extremities, anasarca, dizziness, palpitations  GI  No heartburn, indigestion, abdominal pain, nausea, vomiting, diarrhea, change in bowel habits, loss of appetite  Resp: Notes shortness of breath with exertion not  at rest.   No wheezing.  No chest wall deformity  Skin: no rash or lesions.  GU: no dysuria, change in color of urine, no urgency or frequency.  No flank pain.  MS:  No joint pain or swelling.  No decreased range of motion.  No back pain.  Psych:  No change in mood or affect. No depression or anxiety.  No memory loss.     Objective:   Physical Exam   Filed Vitals:   09/30/12 1406  BP: 110/74  Pulse: 97  Temp: 97.3 F (36.3 C)  Height: 5\' 4"  (1.626 m)  Weight: 160 lb (72.576 kg)  SpO2: 94%    Gen: Pleasant, well-nourished, in no distress,  normal affect  ENT: No lesions,  mouth clear,  oropharynx clear, no  postnasal drip  Neck: No JVD, no TMG, no carotid bruits  Lungs: No use of accessory muscles, no dullness to percussion , coarse BS w/ few exp wheezes   Cardiovascular: RRR, heart sounds normal, no murmur or gallops, no peripheral edema  Abdomen: soft and NT, no HSM,  BS normal  Musculoskeletal: No deformities, no cyanosis or clubbing  Neuro: alert, non focal  Skin: Warm, no lesions or rashes         Assessment & Plan:   CHRONIC OBSTRUCTIVE PULMONARY DISEASE, 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 albuterol (PROVENTIL) (2.5 MG/3ML) 0.083% nebulizer solution Take 2.5 mg by nebulization every 6 (six) hours as needed.        Marland Kitchen allopurinol (ZYLOPRIM) 300 MG tablet 1/2 tab by mouth once daily       . amiodarone (PACERONE) 200 MG tablet Take 200 mg by mouth daily.      Marland Kitchen aspirin 81 MG tablet Take 81 mg by mouth daily.        . budesonide-formoterol (SYMBICORT) 160-4.5 MCG/ACT inhaler Take 2 puffs first thing in am and then another 2 puffs about 12 hours later.         . carbonyl iron (FEOSOL) 45 MG TABS Take 45 mg by mouth 2 (two) times daily with a meal.        . diltiazem (CARDIZEM CD) 240 MG 24 hr capsule TAKE ONE CAPSULE BY MOUTH EVERY DAY  90 capsule  0  . furosemide (LASIX) 40 MG tablet 1/2 tab by mouth once daily       . losartan (COZAAR) 50 MG tablet Take 50 mg by mouth daily.        . nebivolol (BYSTOLIC) 5 MG tablet Take 5 mg by mouth daily.        Marland Kitchen omeprazole (PRILOSEC) 20 MG capsule 2 by mouth 30 minutes before first meal of the day       . simvastatin (ZOCOR) 20 MG tablet 1/2 by mouth daily at bedtime       . tiotropium (SPIRIVA) 18 MCG inhalation capsule Place 18 mcg into inhaler and inhale daily.        Marland Kitchen warfarin (COUMADIN) 1 MG tablet Take 1 mg by mouth daily.        . cefUROXime (CEFTIN) 500 MG tablet Take 1 tablet (500 mg total) by mouth 2 (two) times daily.  20 tablet  0  . predniSONE (DELTASONE) 10 MG tablet Take 4 for three days 3 for three days 2 for three days 1 for three days and stop  30 tablet  0  . [DISCONTINUED] predniSONE (DELTASONE) 10 MG tablet 4 tabs for 2 days, then 3 tabs for 2 days, 2 tabs for 2 days,  then 1 tab for 2 days, then stop  20 tablet  0

## 2012-10-01 NOTE — Assessment & Plan Note (Signed)
Ongoing tobacco abuse 310 minutes of smoking cessation counseling was given on 09/30/12

## 2012-10-01 NOTE — Assessment & Plan Note (Signed)
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 with ongoing smoking use Acute exacerbation of same Plan Take cefuroxime one twice daily for 10days Prednisone 10mg  Take 4 for three days 3 for three days 2 for three days 1 for three days and stop Focus on stopping or limiting smoking No other medication changes Return 1 month

## 2012-10-12 ENCOUNTER — Encounter (HOSPITAL_COMMUNITY): Payer: Medicare Other

## 2012-10-29 ENCOUNTER — Ambulatory Visit: Payer: Medicare Other | Admitting: Critical Care Medicine

## 2012-11-04 ENCOUNTER — Encounter (HOSPITAL_COMMUNITY): Payer: Medicare Other

## 2012-11-10 HISTORY — PX: OTHER SURGICAL HISTORY: SHX169

## 2012-11-10 HISTORY — PX: POLYPECTOMY: SHX149

## 2012-11-25 ENCOUNTER — Inpatient Hospital Stay: Payer: Self-pay | Admitting: Internal Medicine

## 2012-11-25 DIAGNOSIS — R0602 Shortness of breath: Secondary | ICD-10-CM

## 2012-11-25 DIAGNOSIS — I4891 Unspecified atrial fibrillation: Secondary | ICD-10-CM

## 2012-11-25 LAB — COMPREHENSIVE METABOLIC PANEL
Alkaline Phosphatase: 126 U/L (ref 50–136)
Anion Gap: 8 (ref 7–16)
BUN: 19 mg/dL — ABNORMAL HIGH (ref 7–18)
Bilirubin,Total: 0.7 mg/dL (ref 0.2–1.0)
Chloride: 106 mmol/L (ref 98–107)
Co2: 28 mmol/L (ref 21–32)
Creatinine: 0.92 mg/dL (ref 0.60–1.30)
EGFR (African American): >60
Glucose: 228 mg/dL — ABNORMAL HIGH (ref 65–99)
Osmolality: 293 (ref 275–301)
Potassium: 4 mmol/L (ref 3.5–5.1)
SGPT (ALT): 22 U/L (ref 12–78)
Sodium: 142 mmol/L (ref 136–145)
Total Protein: 7.2 g/dL (ref 6.4–8.2)

## 2012-11-25 LAB — APTT: Activated PTT: 55.8 secs — ABNORMAL HIGH (ref 23.6–35.9)

## 2012-11-25 LAB — CBC
HCT: 33.2 % — ABNORMAL LOW (ref 40.0–52.0)
HGB: 11 g/dL — ABNORMAL LOW (ref 13.0–18.0)
MCH: 32.5 pg (ref 26.0–34.0)
MCHC: 33.1 g/dL (ref 32.0–36.0)
RBC: 3.38 10*6/uL — ABNORMAL LOW (ref 4.40–5.90)
RDW: 15.2 % — ABNORMAL HIGH (ref 11.5–14.5)
WBC: 5.6 10*3/uL (ref 3.8–10.6)

## 2012-11-25 LAB — PRO B NATRIURETIC PEPTIDE: B-Type Natriuretic Peptide: 1681 pg/mL — ABNORMAL HIGH (ref 0–125)

## 2012-11-25 LAB — CK TOTAL AND CKMB (NOT AT ARMC): CK-MB: 0.7 ng/mL (ref 0.5–3.6)

## 2012-11-25 LAB — TROPONIN I: Troponin-I: <0.02 ng/mL

## 2012-11-25 LAB — PROTIME-INR: INR: 2.9

## 2012-11-26 DIAGNOSIS — I059 Rheumatic mitral valve disease, unspecified: Secondary | ICD-10-CM

## 2012-11-26 LAB — CBC WITH DIFFERENTIAL/PLATELET
Basophil #: 0 10*3/uL (ref 0.0–0.1)
Basophil %: 0.4 %
Eosinophil #: 0 10*3/uL (ref 0.0–0.7)
Eosinophil %: 0 %
HCT: 32 % — ABNORMAL LOW (ref 40.0–52.0)
Lymphocyte #: 0.4 10*3/uL — ABNORMAL LOW (ref 1.0–3.6)
MCH: 32.8 pg (ref 26.0–34.0)
MCHC: 33.9 g/dL (ref 32.0–36.0)
MCV: 97 fL (ref 80–100)
Monocyte #: 0.1 x10 3/mm — ABNORMAL LOW (ref 0.2–1.0)
Neutrophil #: 2.9 10*3/uL (ref 1.4–6.5)
RBC: 3.3 10*6/uL — ABNORMAL LOW (ref 4.40–5.90)
RDW: 14.5 % (ref 11.5–14.5)

## 2012-11-26 LAB — BASIC METABOLIC PANEL
Anion Gap: 9 (ref 7–16)
Chloride: 103 mmol/L (ref 98–107)
Co2: 29 mmol/L (ref 21–32)
Creatinine: 0.94 mg/dL (ref 0.60–1.30)
Glucose: 254 mg/dL — ABNORMAL HIGH (ref 65–99)
Sodium: 141 mmol/L (ref 136–145)

## 2012-11-26 LAB — LIPID PANEL
Cholesterol: 102 mg/dL (ref 0–200)
Triglycerides: 42 mg/dL (ref 0–200)

## 2012-11-26 LAB — HEMOGLOBIN A1C: Hemoglobin A1C: 6.7 % — ABNORMAL HIGH (ref 4.2–6.3)

## 2012-11-26 LAB — CK TOTAL AND CKMB (NOT AT ARMC)
CK, Total: 28 U/L — ABNORMAL LOW (ref 35–232)
CK-MB: 0.9 ng/mL (ref 0.5–3.6)

## 2012-11-26 LAB — TROPONIN I: Troponin-I: <0.02 ng/mL

## 2012-11-26 LAB — MAGNESIUM: Magnesium: 1.9 mg/dL

## 2012-11-27 LAB — PROTIME-INR
INR: 3.7
Prothrombin Time: 36.7 secs — ABNORMAL HIGH (ref 11.5–14.7)

## 2012-11-29 ENCOUNTER — Ambulatory Visit (HOSPITAL_COMMUNITY)
Admission: RE | Admit: 2012-11-29 | Discharge: 2012-11-29 | Disposition: A | Discharge status: Home / Self Care | Payer: Medicare Other | Source: Ambulatory Visit | Attending: Cardiovascular Disease | Admitting: Cardiovascular Disease

## 2012-11-29 DIAGNOSIS — I701 Atherosclerosis of renal artery: Secondary | ICD-10-CM | POA: Insufficient documentation

## 2012-11-29 NOTE — Progress Notes (Signed)
Renal Doppler completed. Jason Larsen

## 2012-12-03 ENCOUNTER — Other Ambulatory Visit (HOSPITAL_COMMUNITY): Payer: Self-pay | Admitting: Cardiovascular Disease

## 2012-12-03 DIAGNOSIS — I119 Hypertensive heart disease without heart failure: Secondary | ICD-10-CM

## 2012-12-15 ENCOUNTER — Ambulatory Visit (HOSPITAL_COMMUNITY)
Admission: RE | Admit: 2012-12-15 | Discharge: 2012-12-15 | Disposition: A | Discharge status: Home / Self Care | Payer: Medicare Other | Source: Ambulatory Visit | Attending: Cardiovascular Disease | Admitting: Cardiovascular Disease

## 2012-12-15 DIAGNOSIS — I119 Hypertensive heart disease without heart failure: Secondary | ICD-10-CM | POA: Insufficient documentation

## 2012-12-15 DIAGNOSIS — J4489 Other specified chronic obstructive pulmonary disease: Secondary | ICD-10-CM | POA: Insufficient documentation

## 2012-12-15 DIAGNOSIS — J449 Chronic obstructive pulmonary disease, unspecified: Secondary | ICD-10-CM | POA: Insufficient documentation

## 2012-12-15 DIAGNOSIS — I4891 Unspecified atrial fibrillation: Secondary | ICD-10-CM | POA: Insufficient documentation

## 2012-12-15 DIAGNOSIS — I369 Nonrheumatic tricuspid valve disorder, unspecified: Secondary | ICD-10-CM | POA: Insufficient documentation

## 2012-12-15 DIAGNOSIS — I379 Nonrheumatic pulmonary valve disorder, unspecified: Secondary | ICD-10-CM | POA: Insufficient documentation

## 2012-12-15 DIAGNOSIS — I080 Rheumatic disorders of both mitral and aortic valves: Secondary | ICD-10-CM | POA: Insufficient documentation

## 2012-12-15 NOTE — Progress Notes (Signed)
2D Echo Performed 12/15/2012    Yaneisy Wenz, RCS  

## 2013-01-19 ENCOUNTER — Ambulatory Visit
Admission: RE | Admit: 2013-01-19 | Discharge: 2013-01-19 | Disposition: A | Discharge status: Home / Self Care | Payer: Medicare Other | Source: Ambulatory Visit | Attending: Cardiovascular Disease | Admitting: Cardiovascular Disease

## 2013-01-19 ENCOUNTER — Other Ambulatory Visit: Payer: Self-pay | Admitting: Cardiovascular Disease

## 2013-01-19 ENCOUNTER — Other Ambulatory Visit (HOSPITAL_COMMUNITY): Payer: Self-pay | Admitting: Cardiovascular Disease

## 2013-01-19 DIAGNOSIS — R0602 Shortness of breath: Secondary | ICD-10-CM

## 2013-01-19 DIAGNOSIS — Z8709 Personal history of other diseases of the respiratory system: Secondary | ICD-10-CM

## 2013-01-24 ENCOUNTER — Inpatient Hospital Stay: Payer: Self-pay | Admitting: Family Medicine

## 2013-01-24 LAB — CBC WITH DIFFERENTIAL/PLATELET
Basophil #: 0.1 10*3/uL (ref 0.0–0.1)
Basophil %: 0.8 %
Eosinophil #: 0 10*3/uL (ref 0.0–0.7)
HGB: 5.9 g/dL — ABNORMAL LOW (ref 13.0–18.0)
Lymphocyte #: 1 10*3/uL (ref 1.0–3.6)
MCH: 27.9 pg (ref 26.0–34.0)
MCHC: 31.1 g/dL — ABNORMAL LOW (ref 32.0–36.0)
Monocyte #: 1 x10 3/mm (ref 0.2–1.0)
Monocyte %: 13 %
Platelet: 323 10*3/uL (ref 150–440)
RBC: 2.12 10*6/uL — ABNORMAL LOW (ref 4.40–5.90)
RDW: 14.8 % — ABNORMAL HIGH (ref 11.5–14.5)
WBC: 7.6 10*3/uL (ref 3.8–10.6)

## 2013-01-24 LAB — COMPREHENSIVE METABOLIC PANEL
Albumin: 2.8 g/dL — ABNORMAL LOW (ref 3.4–5.0)
Alkaline Phosphatase: 91 U/L (ref 50–136)
BUN: 28 mg/dL — ABNORMAL HIGH (ref 7–18)
Chloride: 102 mmol/L (ref 98–107)
Co2: 26 mmol/L (ref 21–32)
Creatinine: 1.08 mg/dL (ref 0.60–1.30)
EGFR (Non-African Amer.): >60
Osmolality: 285 (ref 275–301)
Potassium: 3.7 mmol/L (ref 3.5–5.1)
SGOT(AST): 41 U/L — ABNORMAL HIGH (ref 15–37)
Sodium: 138 mmol/L (ref 136–145)
Total Protein: 6.4 g/dL (ref 6.4–8.2)

## 2013-01-24 LAB — BASIC METABOLIC PANEL
Anion Gap: 9 (ref 7–16)
BUN: 28 mg/dL — ABNORMAL HIGH (ref 7–18)
Calcium, Total: 8.5 mg/dL (ref 8.5–10.1)
Creatinine: 1.06 mg/dL (ref 0.60–1.30)
EGFR (African American): >60
EGFR (Non-African Amer.): >60
Glucose: 169 mg/dL — ABNORMAL HIGH (ref 65–99)
Osmolality: 287 (ref 275–301)
Potassium: 3.6 mmol/L (ref 3.5–5.1)
Sodium: 139 mmol/L (ref 136–145)

## 2013-01-24 LAB — CBC
HGB: 5.7 g/dL — ABNORMAL LOW (ref 13.0–18.0)
MCH: 27 pg (ref 26.0–34.0)
RBC: 2.12 10*6/uL — ABNORMAL LOW (ref 4.40–5.90)

## 2013-01-25 ENCOUNTER — Ambulatory Visit: Payer: Medicare Other | Admitting: Adult Health

## 2013-01-25 LAB — HEMOGLOBIN: HGB: 7.9 g/dL — ABNORMAL LOW (ref 13.0–18.0)

## 2013-01-25 LAB — PROTIME-INR
INR: 2.6
INR: 2.6 — AB (ref 0.9–1.1)
Prothrombin Time: 27 secs — ABNORMAL HIGH (ref 11.5–14.7)

## 2013-01-26 LAB — CBC WITH DIFFERENTIAL/PLATELET
Basophil #: 0 10*3/uL (ref 0.0–0.1)
Basophil %: 0.1 %
Eosinophil #: 0 10*3/uL (ref 0.0–0.7)
HGB: 7.7 g/dL — ABNORMAL LOW (ref 13.0–18.0)
Lymphocyte %: 10.2 %
MCH: 28.7 pg (ref 26.0–34.0)
MCV: 87 fL (ref 80–100)
Monocyte #: 0.1 x10 3/mm — ABNORMAL LOW (ref 0.2–1.0)
Monocyte %: 2.6 %
Neutrophil #: 2.4 10*3/uL (ref 1.4–6.5)
RDW: 14.6 % — ABNORMAL HIGH (ref 11.5–14.5)

## 2013-01-27 LAB — CBC WITH DIFFERENTIAL/PLATELET
Basophil #: 0 10*3/uL (ref 0.0–0.1)
Basophil %: 0 %
HCT: 24.4 % — ABNORMAL LOW (ref 40.0–52.0)
HGB: 7.7 g/dL — ABNORMAL LOW (ref 13.0–18.0)
Lymphocyte #: 0.2 10*3/uL — ABNORMAL LOW (ref 1.0–3.6)
MCH: 27.7 pg (ref 26.0–34.0)
MCV: 87 fL (ref 80–100)
Monocyte #: 0.1 x10 3/mm — ABNORMAL LOW (ref 0.2–1.0)
Monocyte %: 4 %
Platelet: 235 10*3/uL (ref 150–440)
RBC: 2.79 10*6/uL — ABNORMAL LOW (ref 4.40–5.90)
RDW: 14.4 % (ref 11.5–14.5)
WBC: 3.6 10*3/uL — ABNORMAL LOW (ref 3.8–10.6)

## 2013-01-27 LAB — PROTIME-INR: Prothrombin Time: 23.8 secs — ABNORMAL HIGH (ref 11.5–14.7)

## 2013-01-28 LAB — CBC WITH DIFFERENTIAL/PLATELET
Basophil #: 0 10*3/uL (ref 0.0–0.1)
Eosinophil #: 0 10*3/uL (ref 0.0–0.7)
Eosinophil %: 0 %
HCT: 25.2 % — ABNORMAL LOW (ref 40.0–52.0)
Lymphocyte #: 0.4 10*3/uL — ABNORMAL LOW (ref 1.0–3.6)
MCHC: 33.2 g/dL (ref 32.0–36.0)
MCV: 87 fL (ref 80–100)
Monocyte #: 0.4 x10 3/mm (ref 0.2–1.0)
Monocyte %: 9.5 %
Neutrophil #: 3.5 10*3/uL (ref 1.4–6.5)
Platelet: 241 10*3/uL (ref 150–440)
RBC: 2.9 10*6/uL — ABNORMAL LOW (ref 4.40–5.90)
WBC: 4.3 10*3/uL (ref 3.8–10.6)

## 2013-01-31 ENCOUNTER — Telehealth: Payer: Self-pay | Admitting: Critical Care Medicine

## 2013-01-31 NOTE — Telephone Encounter (Signed)
LMOMTCB X1 FOR PT 

## 2013-02-01 ENCOUNTER — Telehealth: Payer: Self-pay | Admitting: Gastroenterology

## 2013-02-01 NOTE — Telephone Encounter (Signed)
Spoke with pts wife. States he needs an appt with Dr. Delford Field and in epic it shows he has one. Wife was not sure about GI, states he was seen by doctors in [place]. Wife states she will have pt call us if he needs to be seen.

## 2013-02-01 NOTE — Telephone Encounter (Signed)
He was at [facility]. I faxed a request for the medical records.

## 2013-02-01 NOTE — Telephone Encounter (Signed)
ATC pt at # provided above - NA and no option to leave msg.   No other contact #s in pt's chart.  WCB

## 2013-02-01 NOTE — Telephone Encounter (Signed)
Spoke with pt.  Pt states he was d/c'd from [facility] over the weekend with low Hgb.  Pt reports he was instructed to f/u with Dr. Delford Field ASAP.  We have scheduled pt to see Dr. Delford Field on tomorrow, March 26 at 2:15 pm.  Pt aware and ok with this date and time.

## 2013-02-01 NOTE — Telephone Encounter (Signed)
Pt was at [facility] recently for GI bleed. Pt was on coumadin and colon/egd could not be done. Pt was seen by cardiologist today and coumadin is on hold now. Requesting to be seen and have possible procedures. Pt scheduled to see Dr. [name] 02/09/13@10 :30am. Pt aware of appt date and time.

## 2013-02-01 NOTE — Telephone Encounter (Signed)
Pt returned call. Jason Larsen  

## 2013-02-01 NOTE — Telephone Encounter (Signed)
Pt returned call. Jason Larsen °

## 2013-02-02 ENCOUNTER — Ambulatory Visit (INDEPENDENT_AMBULATORY_CARE_PROVIDER_SITE_OTHER): Payer: Medicare Other | Admitting: Critical Care Medicine

## 2013-02-02 ENCOUNTER — Encounter: Payer: Self-pay | Admitting: Critical Care Medicine

## 2013-02-02 VITALS — BP 100/60 | HR 90 | Temp 97.4°F | Ht 64.0 in | Wt 154.2 lb

## 2013-02-02 DIAGNOSIS — J439 Emphysema, unspecified: Secondary | ICD-10-CM

## 2013-02-02 DIAGNOSIS — J438 Other emphysema: Secondary | ICD-10-CM

## 2013-02-02 DIAGNOSIS — D649 Anemia, unspecified: Secondary | ICD-10-CM

## 2013-02-02 DIAGNOSIS — J449 Chronic obstructive pulmonary disease, unspecified: Secondary | ICD-10-CM

## 2013-02-02 NOTE — Progress Notes (Signed)
Subjective:    Patient ID: Jason Larsen, male    DOB: Apr 08, 1941, 72 y.o.   MRN: [id number]  HPI 72 y.o. WM  active smoker followed by Dr Jason Larsen last seen 11/2010 with rec to stop smoking and return in 6 weeks  02/02/2013 Admitted [facility] for severe anemia. Transfused PRBC.  Hgb 5.0>>>8.2.   Jason Larsen is GI ?colon Now is moving more air, pt now in afib. Coumadin was on board, now stopped. Did notice one day some loose black diarrheal stools.     CAT Score 02/02/2013 09/30/2012  Total CAT Score 25 13    Review of Systems  Constitutional:   No  weight loss, night sweats,  Fevers, chills, + fatigue, lassitude. HEENT:   No headaches,  Difficulty swallowing,  Tooth/dental problems,  Sore throat,                No sneezing, itching, ear ache, nasal congestion, post nasal drip,   CV:  No chest pain,  Orthopnea, PND, swelling in lower extremities, anasarca, dizziness, palpitations  GI  No heartburn, indigestion, abdominal pain, nausea, vomiting, diarrhea, change in bowel habits, loss of appetite  Resp: Notes shortness of breath with exertion not  at rest.   No wheezing.  No chest wall deformity  Skin: no rash or lesions.  GU: no dysuria, change in color of urine, no urgency or frequency.  No flank pain.  MS:  No joint pain or swelling.  No decreased range of motion.  No back pain.  Psych:  No change in mood or affect. No depression or anxiety.  No memory loss.     Objective:   Physical Exam   Filed Vitals:   02/02/13 1412  BP: 100/60  Pulse: 90  Temp: 97.4 F (36.3 C)  TempSrc: Oral  Height: 5\' 4"  (1.626 m)  Weight: 154 lb 3.2 oz (69.945 kg)  SpO2: 96%    Gen: Pleasant, well-nourished, in no distress,  normal affect  ENT: No lesions,  mouth clear,  oropharynx clear, no postnasal drip  Neck: No JVD, no TMG, no carotid bruits  Lungs: No use of accessory muscles, no dullness to percussion , distant BS  Cardiovascular: RRR, heart sounds normal, no murmur or gallops, no  peripheral edema  Abdomen: soft and NT, no HSM,  BS normal  Musculoskeletal: No deformities, no cyanosis or clubbing  Neuro: alert, non focal  Skin: Warm, no lesions or rashes     Assessment & Plan:   CHRONIC OBSTRUCTIVE PULMONARY DISEASE, SEVERE Golds C Gold stage C. COPD with chronic obstructive airways disease status post recent hospitalization for severe anemia do to lower GI bleeding. Plan This patient can be cleared for planned colonoscopy Smoking cessation is advised No change in inhaled medications  ANEMIA Chronic anemia with acute anemia due to lower GI bleeding and acute blood loss Plan Patient's to keep referral with gastroenterology    Updated Medication List Outpatient Encounter Prescriptions as of 02/02/2013  Medication Sig Dispense Refill  . albuterol (PROVENTIL) (2.5 MG/3ML) 0.083% nebulizer solution Take 2.5 mg by nebulization 2 (two) times daily. And as needed      . allopurinol (ZYLOPRIM) 300 MG tablet 1/2 tab by mouth once daily       . amiodarone (PACERONE) 200 MG tablet Take 200 mg by mouth 2 (two) times daily.       Marland Kitchen aspirin 81 MG tablet Take 81 mg by mouth daily.        Marland Kitchen diltiazem (DILACOR XR)  120 MG 24 hr capsule Take 1 capsule by mouth daily.      . ferrous sulfate 325 (65 FE) MG tablet Take 325 mg by mouth daily with breakfast.      . formoterol (FORADIL) 12 MCG capsule for inhaler Place 12 mcg into inhaler and inhale 2 (two) times daily.      . furosemide (LASIX) 40 MG tablet Take 40 mg by mouth 2 (two) times daily.       Marland Kitchen losartan (COZAAR) 50 MG tablet Take 25 mg by mouth daily.       . metFORMIN (GLUCOPHAGE) 500 MG tablet Take 1,000 mg by mouth 2 (two) times daily with a meal.      . metoprolol tartrate (LOPRESSOR) 25 MG tablet Take 1 tablet by mouth 2 (two) times daily.      Marland Kitchen omeprazole (PRILOSEC) 20 MG capsule Take 20 mg by mouth daily. 30 minutes before first meal of the day      . pantoprazole (PROTONIX) 40 MG tablet Take 40 mg by mouth 2  (two) times daily.      . predniSONE (DELTASONE) 10 MG tablet Taper as directed from hospital d/c      . simvastatin (ZOCOR) 20 MG tablet 1/2 by mouth daily at bedtime       . tiotropium (SPIRIVA) 18 MCG inhalation capsule Place 18 mcg into inhaler and inhale daily.        . [DISCONTINUED] predniSONE (DELTASONE) 10 MG tablet Take 4 for three days 3 for three days 2 for three days 1 for three days and stop  30 tablet  0  . warfarin (COUMADIN) 1 MG tablet ON HOLD      . [DISCONTINUED] budesonide-formoterol (SYMBICORT) 160-4.5 MCG/ACT inhaler Take 2 puffs first thing in am and then another 2 puffs about 12 hours later.         . [DISCONTINUED] carbonyl iron (FEOSOL) 45 MG TABS Take 45 mg by mouth 2 (two) times daily with a meal.        . [DISCONTINUED] cefUROXime (CEFTIN) 500 MG tablet Take 1 tablet (500 mg total) by mouth 2 (two) times daily.  20 tablet  0  . [DISCONTINUED] diltiazem (CARDIZEM CD) 240 MG 24 hr capsule TAKE ONE CAPSULE BY MOUTH EVERY DAY  90 capsule  0  . [DISCONTINUED] nebivolol (BYSTOLIC) 5 MG tablet Take 5 mg by mouth daily.         No facility-administered encounter medications on file as of 02/02/2013.

## 2013-02-02 NOTE — Patient Instructions (Addendum)
An overnight oxygen study will be done No change in medications Return 4 months

## 2013-02-03 ENCOUNTER — Inpatient Hospital Stay (HOSPITAL_COMMUNITY): Admission: RE | Admit: 2013-02-03 | Payer: Medicare Other | Source: Ambulatory Visit

## 2013-02-03 NOTE — Assessment & Plan Note (Signed)
Gold stage C. COPD with chronic obstructive airways disease status post recent hospitalization for severe anemia do to lower GI bleeding. Plan This patient can be cleared for planned colonoscopy Smoking cessation is advised No change in inhaled medications

## 2013-02-03 NOTE — Assessment & Plan Note (Signed)
Chronic anemia with acute anemia due to lower GI bleeding and acute blood loss Plan Patient's to keep referral with gastroenterology

## 2013-02-09 ENCOUNTER — Other Ambulatory Visit (INDEPENDENT_AMBULATORY_CARE_PROVIDER_SITE_OTHER): Payer: Medicare Other

## 2013-02-09 ENCOUNTER — Encounter: Payer: Self-pay | Admitting: Gastroenterology

## 2013-02-09 ENCOUNTER — Encounter (HOSPITAL_COMMUNITY): Payer: Self-pay | Admitting: *Deleted

## 2013-02-09 ENCOUNTER — Ambulatory Visit (INDEPENDENT_AMBULATORY_CARE_PROVIDER_SITE_OTHER): Payer: Medicare Other | Admitting: Gastroenterology

## 2013-02-09 VITALS — BP 110/60 | HR 90 | Ht 64.0 in | Wt 147.0 lb

## 2013-02-09 DIAGNOSIS — K921 Melena: Secondary | ICD-10-CM | POA: Insufficient documentation

## 2013-02-09 DIAGNOSIS — R160 Hepatomegaly, not elsewhere classified: Secondary | ICD-10-CM

## 2013-02-09 DIAGNOSIS — Z860100 Personal history of colon polyps, unspecified: Secondary | ICD-10-CM | POA: Insufficient documentation

## 2013-02-09 DIAGNOSIS — R109 Unspecified abdominal pain: Secondary | ICD-10-CM

## 2013-02-09 LAB — CBC WITH DIFFERENTIAL/PLATELET
Basophils Absolute: 0 10*3/uL (ref 0.0–0.1)
Eosinophils Relative: 0.7 % (ref 0.0–5.0)
HCT: 25.7 % — ABNORMAL LOW (ref 39.0–52.0)
Hemoglobin: 7.9 g/dL — CL (ref 13.0–17.0)
Lymphs Abs: 0.8 10*3/uL (ref 0.7–4.0)
MCV: 82.9 fl (ref 78.0–100.0)
Monocytes Relative: 8.3 % (ref 3.0–12.0)
Neutro Abs: 4.8 10*3/uL (ref 1.4–7.7)
RDW: 16.9 % — ABNORMAL HIGH (ref 11.5–14.6)

## 2013-02-09 LAB — HEPATIC FUNCTION PANEL
ALT: 34 U/L (ref 0–53)
AST: 26 U/L (ref 0–37)
Albumin: 3.4 g/dL — ABNORMAL LOW (ref 3.5–5.2)

## 2013-02-09 LAB — PROTIME-INR: Prothrombin Time: 12.3 s (ref 10.2–12.4)

## 2013-02-09 MED ORDER — NA SULFATE-K SULFATE-MG SULF 17.5-3.13-1.6 GM/177ML PO SOLN: 1.0000 | Freq: Once | ORAL | Status: DC

## 2013-02-09 NOTE — Assessment & Plan Note (Addendum)
Patient had an acute GI bleed in the setting of a supratherapeutic INR (INR 7-8). He could have spontaneous GI bleeding in this setting. He is at increased risk for peptic disease. History is notable for colon polyps in 2009.  Recommendations #1 endoscopy and colonoscopy-to be done at the same sitting. Because of severe COPD sedation will be administered by anesthesiology

## 2013-02-09 NOTE — Patient Instructions (Addendum)
You have been scheduled for an abdominal ultrasound at [facility] (1st floor of hospital) on 02/15/2013 at 8:30am. Please arrive 15 minutes prior to your appointment for registration. Make certain not to have anything to eat or drink 6 hours prior to your appointment. Should you need to reschedule your appointment, please contact radiology at [phone]. This test typically takes about 30 minutes to perform.  Go to the basement today for labs  You have already been instructed to hold your coumadin by the prescriber  Your colonoscopy and endoscopy have been scheduled

## 2013-02-09 NOTE — Assessment & Plan Note (Addendum)
Irregular liver edge could reflect a low-lying liver secondary to COPD. Vascular congestion or infiltrative  liver disease should be ruled out.  Recommendations #1 check LFTs #2 abdominal ultrasound

## 2013-02-09 NOTE — Progress Notes (Signed)
History of Present Illness: Pleasant 71-year-old white male with oxygen-dependent COPD, coronary artery disease, atrial fibrillation here following hospitalization in March, 2014 for acute GI bleed in the setting of a supratherapeutic INR. Patient had been taking Coumadin. He received 2 units of packed cells. Since discharge she's had no overt bleeding. Coumadin was discontinued. An adenomatous polyp was removed in 2009. Patient denies GI complaints including change of bowel habits, abdominal pain, pyrosis or dysphagia.    Past Medical History  Diagnosis Date  . Atrial fibrillation     converted to NSR 5/09 on pacerone  . Coronary heart disease   . Hyperlipidemia   . HTN (hypertension)   . COPD (chronic obstructive pulmonary disease)   . PVD (peripheral vascular disease)    No past surgical history on file. family history includes Cancer in his mother; Emphysema in his father; Heart disease in his father; and Tuberculosis in his paternal grandfather. Current Outpatient Prescriptions  Medication Sig Dispense Refill  . albuterol (PROVENTIL) (2.5 MG/3ML) 0.083% nebulizer solution Take 2.5 mg by nebulization 2 (two) times daily. And as needed      . allopurinol (ZYLOPRIM) 300 MG tablet 1/2 tab by mouth once daily       . amiodarone (PACERONE) 200 MG tablet Take 200 mg by mouth 2 (two) times daily.       . aspirin 81 MG tablet Take 81 mg by mouth daily.        . diltiazem (DILACOR XR) 120 MG 24 hr capsule Take 1 capsule by mouth daily.      . ferrous sulfate 325 (65 FE) MG tablet Take 325 mg by mouth daily with breakfast.      . formoterol (FORADIL) 12 MCG capsule for inhaler Place 12 mcg into inhaler and inhale 2 (two) times daily.      . furosemide (LASIX) 40 MG tablet Take 40 mg by mouth 2 (two) times daily.       . losartan (COZAAR) 50 MG tablet Take 25 mg by mouth daily.       . metFORMIN (GLUCOPHAGE) 500 MG tablet Take 1,000 mg by mouth 2 (two) times daily with a meal.      . metoprolol  tartrate (LOPRESSOR) 25 MG tablet Take 1 tablet by mouth 2 (two) times daily.      . omeprazole (PRILOSEC) 20 MG capsule Take 20 mg by mouth daily. 30 minutes before first meal of the day      . pantoprazole (PROTONIX) 40 MG tablet Take 40 mg by mouth 2 (two) times daily.      . predniSONE (DELTASONE) 10 MG tablet Taper as directed from hospital d/c      . simvastatin (ZOCOR) 20 MG tablet 1/2 by mouth daily at bedtime       . tiotropium (SPIRIVA) 18 MCG inhalation capsule Place 18 mcg into inhaler and inhale daily.        . warfarin (COUMADIN) 1 MG tablet ON HOLD       No current facility-administered medications for this visit.   Allergies as of 02/09/2013  . (No Known Allergies)    reports that he has been smoking Cigarettes.  He has a 10.8 pack-year smoking history. He has never used smokeless tobacco. He reports that he drinks about 1.0 ounces of alcohol per week. He reports that he does not use illicit drugs.     Review of Systems: He complains of shortness of breath with dyspnea on exertion. He also has lower extremity   swelling Pertinent positive and negative review of systems were noted in the above HPI section. All other review of systems were otherwise negative.  Vital signs were reviewed in today's medical record Physical Exam: General: Well developed , well nourished, no acute distress Skin: anicteric Head: Normocephalic and atraumatic Eyes:  sclerae anicteric, EOMI Ears: Normal auditory acuity Mouth: No deformity or lesions Neck: Supple, no masses or thyromegaly Lungs: Clear throughout to auscultation Heart: Regular rate and rhythm; no murmurs, rubs or bruits Abdomen: Soft, non tender and non distended. No masses, or hernias noted. Normal Bowel sounds. Liver edge is palpable 1 fingerbreadth below the right costal margin and is slightly irregular. Are percusses to 11 cm Rectal:deferred Musculoskeletal: Symmetrical with no gross deformities  Skin: No lesions on visible  extremities Pulses:  Normal pulses noted Extremities: No clubbing, cyanosis,  or deformities noted. There is 1+ duodenum Neurological: Alert oriented x 4, grossly nonfocal Cervical Nodes:  No significant cervical adenopathy Inguinal Nodes: No significant inguinal adenopathy Psychological:  Alert and cooperative. Normal mood and affect       

## 2013-02-09 NOTE — Assessment & Plan Note (Signed)
Plan followup colonoscopy. See comments above

## 2013-02-10 ENCOUNTER — Encounter (HOSPITAL_COMMUNITY): Payer: Self-pay | Admitting: *Deleted

## 2013-02-11 ENCOUNTER — Other Ambulatory Visit: Payer: Self-pay | Admitting: Gastroenterology

## 2013-02-11 ENCOUNTER — Ambulatory Visit (HOSPITAL_COMMUNITY)
Admission: RE | Admit: 2013-02-11 | Discharge: 2013-02-11 | Disposition: A | Discharge status: Home / Self Care | Payer: Medicare Other | Source: Ambulatory Visit | Attending: Gastroenterology | Admitting: Gastroenterology

## 2013-02-11 DIAGNOSIS — K828 Other specified diseases of gallbladder: Secondary | ICD-10-CM | POA: Insufficient documentation

## 2013-02-11 DIAGNOSIS — I10 Essential (primary) hypertension: Secondary | ICD-10-CM | POA: Insufficient documentation

## 2013-02-11 DIAGNOSIS — D649 Anemia, unspecified: Secondary | ICD-10-CM

## 2013-02-11 DIAGNOSIS — Q619 Cystic kidney disease, unspecified: Secondary | ICD-10-CM | POA: Insufficient documentation

## 2013-02-11 DIAGNOSIS — R109 Unspecified abdominal pain: Secondary | ICD-10-CM | POA: Insufficient documentation

## 2013-02-11 DIAGNOSIS — E119 Type 2 diabetes mellitus without complications: Secondary | ICD-10-CM | POA: Insufficient documentation

## 2013-02-16 ENCOUNTER — Encounter (HOSPITAL_COMMUNITY): Payer: Self-pay | Admitting: Pharmacy Technician

## 2013-02-23 ENCOUNTER — Encounter (HOSPITAL_COMMUNITY): Payer: Self-pay | Admitting: *Deleted

## 2013-02-23 ENCOUNTER — Encounter (HOSPITAL_COMMUNITY)
Admission: RE | Disposition: A | Discharge status: Home / Self Care | Payer: Self-pay | Source: Ambulatory Visit | Attending: Gastroenterology

## 2013-02-23 ENCOUNTER — Ambulatory Visit (HOSPITAL_COMMUNITY)
Admission: RE | Admit: 2013-02-23 | Discharge: 2013-02-23 | Disposition: A | Discharge status: Home / Self Care | Payer: Medicare Other | Source: Ambulatory Visit | Attending: Gastroenterology | Admitting: Gastroenterology

## 2013-02-23 ENCOUNTER — Ambulatory Visit (HOSPITAL_COMMUNITY): Payer: Medicare Other | Admitting: *Deleted

## 2013-02-23 DIAGNOSIS — R109 Unspecified abdominal pain: Secondary | ICD-10-CM

## 2013-02-23 DIAGNOSIS — I10 Essential (primary) hypertension: Secondary | ICD-10-CM | POA: Insufficient documentation

## 2013-02-23 DIAGNOSIS — Z79899 Other long term (current) drug therapy: Secondary | ICD-10-CM | POA: Insufficient documentation

## 2013-02-23 DIAGNOSIS — J449 Chronic obstructive pulmonary disease, unspecified: Secondary | ICD-10-CM | POA: Insufficient documentation

## 2013-02-23 DIAGNOSIS — D649 Anemia, unspecified: Secondary | ICD-10-CM

## 2013-02-23 DIAGNOSIS — R609 Edema, unspecified: Secondary | ICD-10-CM | POA: Insufficient documentation

## 2013-02-23 DIAGNOSIS — I2510 Atherosclerotic heart disease of native coronary artery without angina pectoris: Secondary | ICD-10-CM | POA: Insufficient documentation

## 2013-02-23 DIAGNOSIS — I739 Peripheral vascular disease, unspecified: Secondary | ICD-10-CM | POA: Insufficient documentation

## 2013-02-23 DIAGNOSIS — K5521 Angiodysplasia of colon with hemorrhage: Secondary | ICD-10-CM

## 2013-02-23 DIAGNOSIS — R160 Hepatomegaly, not elsewhere classified: Secondary | ICD-10-CM

## 2013-02-23 DIAGNOSIS — F17200 Nicotine dependence, unspecified, uncomplicated: Secondary | ICD-10-CM | POA: Insufficient documentation

## 2013-02-23 DIAGNOSIS — Z860100 Personal history of colon polyps, unspecified: Secondary | ICD-10-CM

## 2013-02-23 DIAGNOSIS — K921 Melena: Secondary | ICD-10-CM

## 2013-02-23 DIAGNOSIS — K5730 Diverticulosis of large intestine without perforation or abscess without bleeding: Secondary | ICD-10-CM

## 2013-02-23 DIAGNOSIS — Z7901 Long term (current) use of anticoagulants: Secondary | ICD-10-CM | POA: Insufficient documentation

## 2013-02-23 DIAGNOSIS — Z9981 Dependence on supplemental oxygen: Secondary | ICD-10-CM | POA: Insufficient documentation

## 2013-02-23 DIAGNOSIS — J4489 Other specified chronic obstructive pulmonary disease: Secondary | ICD-10-CM | POA: Insufficient documentation

## 2013-02-23 DIAGNOSIS — D126 Benign neoplasm of colon, unspecified: Secondary | ICD-10-CM

## 2013-02-23 DIAGNOSIS — E785 Hyperlipidemia, unspecified: Secondary | ICD-10-CM | POA: Insufficient documentation

## 2013-02-23 HISTORY — PX: ESOPHAGOGASTRODUODENOSCOPY: SHX5428

## 2013-02-23 HISTORY — PX: COLONOSCOPY: SHX5424

## 2013-02-23 HISTORY — DX: Unspecified osteoarthritis, unspecified site: M19.90

## 2013-02-23 HISTORY — DX: Type 2 diabetes mellitus without complications: E11.9

## 2013-02-23 HISTORY — DX: Shortness of breath: R06.02

## 2013-02-23 LAB — GLUCOSE, CAPILLARY: Glucose-Capillary: 101 mg/dL — ABNORMAL HIGH (ref 70–99)

## 2013-02-23 SURGERY — COLONOSCOPY
Anesthesia: Monitor Anesthesia Care

## 2013-02-23 MED ORDER — MIDAZOLAM HCL 5 MG/5ML IJ SOLN
INTRAMUSCULAR | Status: DC | PRN
  Administered 2013-02-23: 1 mg via INTRAVENOUS

## 2013-02-23 MED ORDER — PROPOFOL 10 MG/ML IV BOLUS
INTRAVENOUS | Status: DC | PRN
  Administered 2013-02-23 (×2): 20 mg via INTRAVENOUS

## 2013-02-23 MED ORDER — SODIUM CHLORIDE 0.9 % IV SOLN: INTRAVENOUS | Status: DC

## 2013-02-23 MED ORDER — FENTANYL CITRATE 0.05 MG/ML IJ SOLN
INTRAMUSCULAR | Status: DC | PRN
  Administered 2013-02-23: 25 ug via INTRAVENOUS

## 2013-02-23 MED ORDER — LACTATED RINGERS IV SOLN
INTRAVENOUS | Status: DC
  Administered 2013-02-23: 1000 mL via INTRAVENOUS

## 2013-02-23 MED ORDER — PROPOFOL 10 MG/ML IV EMUL
INTRAVENOUS | Status: DC | PRN
  Administered 2013-02-23: 75 ug/kg/min via INTRAVENOUS

## 2013-02-23 NOTE — H&P (View-Only) (Signed)
History of Present Illness: Pleasant 72 year old white male with oxygen-dependent COPD, coronary artery disease, atrial fibrillation here following hospitalization in March, 2014 for acute GI bleed in the setting of a supratherapeutic INR. Patient had been taking Coumadin. He received 2 units of packed cells. Since discharge she's had no overt bleeding. Coumadin was discontinued. An adenomatous polyp was removed in 2009. Patient denies GI complaints including change of bowel habits, abdominal pain, pyrosis or dysphagia.    Past Medical History  Diagnosis Date  . Atrial fibrillation     converted to NSR 5/09 on pacerone  . Coronary heart disease   . Hyperlipidemia   . HTN (hypertension)   . COPD (chronic obstructive pulmonary disease)   . PVD (peripheral vascular disease)    No past surgical history on file. family history includes Cancer in his mother; Emphysema in his father; Heart disease in his father; and Tuberculosis in his paternal grandfather. Current Outpatient Prescriptions  Medication Sig Dispense Refill  . albuterol (PROVENTIL) (2.5 MG/3ML) 0.083% nebulizer solution Take 2.5 mg by nebulization 2 (two) times daily. And as needed      . allopurinol (ZYLOPRIM) 300 MG tablet 1/2 tab by mouth once daily       . amiodarone (PACERONE) 200 MG tablet Take 200 mg by mouth 2 (two) times daily.       Marland Kitchen aspirin 81 MG tablet Take 81 mg by mouth daily.        Marland Kitchen diltiazem (DILACOR XR) 120 MG 24 hr capsule Take 1 capsule by mouth daily.      . ferrous sulfate 325 (65 FE) MG tablet Take 325 mg by mouth daily with breakfast.      . formoterol (FORADIL) 12 MCG capsule for inhaler Place 12 mcg into inhaler and inhale 2 (two) times daily.      . furosemide (LASIX) 40 MG tablet Take 40 mg by mouth 2 (two) times daily.       Marland Kitchen losartan (COZAAR) 50 MG tablet Take 25 mg by mouth daily.       . metFORMIN (GLUCOPHAGE) 500 MG tablet Take 1,000 mg by mouth 2 (two) times daily with a meal.      . metoprolol  tartrate (LOPRESSOR) 25 MG tablet Take 1 tablet by mouth 2 (two) times daily.      Marland Kitchen omeprazole (PRILOSEC) 20 MG capsule Take 20 mg by mouth daily. 30 minutes before first meal of the day      . pantoprazole (PROTONIX) 40 MG tablet Take 40 mg by mouth 2 (two) times daily.      . predniSONE (DELTASONE) 10 MG tablet Taper as directed from hospital d/c      . simvastatin (ZOCOR) 20 MG tablet 1/2 by mouth daily at bedtime       . tiotropium (SPIRIVA) 18 MCG inhalation capsule Place 18 mcg into inhaler and inhale daily.        Marland Kitchen warfarin (COUMADIN) 1 MG tablet ON HOLD       No current facility-administered medications for this visit.   Allergies as of 02/09/2013  . (No Known Allergies)    reports that he has been smoking Cigarettes.  He has a 10.8 pack-year smoking history. He has never used smokeless tobacco. He reports that he drinks about 1.0 ounces of alcohol per week. He reports that he does not use illicit drugs.     Review of Systems: He complains of shortness of breath with dyspnea on exertion. He also has lower extremity  swelling Pertinent positive and negative review of systems were noted in the above HPI section. All other review of systems were otherwise negative.  Vital signs were reviewed in today's medical record Physical Exam: General: Well developed , well nourished, no acute distress Skin: anicteric Head: Normocephalic and atraumatic Eyes:  sclerae anicteric, EOMI Ears: Normal auditory acuity Mouth: No deformity or lesions Neck: Supple, no masses or thyromegaly Lungs: Clear throughout to auscultation Heart: Regular rate and rhythm; no murmurs, rubs or bruits Abdomen: Soft, non tender and non distended. No masses, or hernias noted. Normal Bowel sounds. Liver edge is palpable 1 fingerbreadth below the right costal margin and is slightly irregular. Are percusses to 11 cm Rectal:deferred Musculoskeletal: Symmetrical with no gross deformities  Skin: No lesions on visible  extremities Pulses:  Normal pulses noted Extremities: No clubbing, cyanosis,  or deformities noted. There is 1+ duodenum Neurological: Alert oriented x 4, grossly nonfocal Cervical Nodes:  No significant cervical adenopathy Inguinal Nodes: No significant inguinal adenopathy Psychological:  Alert and cooperative. Normal mood and affect

## 2013-02-23 NOTE — Anesthesia Postprocedure Evaluation (Signed)
  Anesthesia Post-op Note  Patient: Jason Larsen  Procedure(s) Performed: Procedure(s) (LRB): COLONOSCOPY (N/A) ESOPHAGOGASTRODUODENOSCOPY (EGD) (N/A)  Patient Location: PACU  Anesthesia Type: MAC  Level of Consciousness: awake and alert   Airway and Oxygen Therapy: Patient Spontanous Breathing  Post-op Pain: mild  Post-op Assessment: Post-op Vital signs reviewed, Patient's Cardiovascular Status Stable, Respiratory Function Stable, Patent Airway and No signs of Nausea or vomiting  Last Vitals:  Filed Vitals:   02/23/13 1330  BP: 95/56  Temp:   Resp: 17    Post-op Vital Signs: stable   Complications: No apparent anesthesia complications

## 2013-02-23 NOTE — Op Note (Signed)
[facility] [address] [place], [address]   ENDOSCOPY PROCEDURE REPORT  PATIENT: Jason, Larsen  MR#: [id number] BIRTHDATE: [birth year] , 71  yrs. old GENDER: Male ENDOSCOPIST: Louis Meckel, MD REFERRED BY:  Lennette Bihari, M.D.  Storm Frisk, M.D. PROCEDURE DATE:  02/23/2013 PROCEDURE:  EGD, diagnostic ASA CLASS:     Class III INDICATIONS:  recent melena in the setting of elevated INR. MEDICATIONS: MAC sedation, administered by CRNA TOPICAL ANESTHETIC:  DESCRIPTION OF PROCEDURE: After the risks benefits and alternatives of the procedure were thoroughly explained, informed consent was obtained.  The endoscope [id number] endoscope was introduced through the mouth and advanced to the third portion of the duodenum. Without limitations.  The instrument was slowly withdrawn as the mucosa was fully examined.      The upper, middle and distal third of the esophagus were carefully inspected and no abnormalities were noted.  The z-line was well seen at the GEJ.  The endoscope was pushed into the fundus which was normal including a retroflexed view.  The antrum, gastric body, first and second part of the duodenum were unremarkable. Retroflexed views revealed no abnormalities.     The scope was then withdrawn from the patient and the procedure completed.  COMPLICATIONS: There were no complications. ENDOSCOPIC IMPRESSION: Normal EGD  RECOMMENDATIONS: resume Coumadin  REPEAT EXAM:  eSigned:  Louis Meckel, MD 02/23/2013 1:15 PM   CC:

## 2013-02-23 NOTE — Anesthesia Preprocedure Evaluation (Addendum)
Anesthesia Evaluation  Patient identified by MRN, date of birth, ID band Patient awake    Reviewed: Allergy & Precautions, H&P , NPO status , Patient's Chart, lab work & pertinent test results  Airway Mallampati: II TM Distance: >3 FB Neck ROM: Full    Dental no notable dental hx. (+) Upper Dentures   Pulmonary shortness of breath and with exertion, COPDCurrent Smoker,  breath sounds clear to auscultation  Pulmonary exam normal       Cardiovascular hypertension, Pt. on medications + CAD, + CABG and + Peripheral Vascular Disease + dysrhythmias Atrial Fibrillation Rhythm:Regular Rate:Normal     Neuro/Psych negative neurological ROS  negative psych ROS   GI/Hepatic negative GI ROS, Neg liver ROS,   Endo/Other  diabetes  Renal/GU negative Renal ROS  negative genitourinary   Musculoskeletal negative musculoskeletal ROS (+)   Abdominal   Peds negative pediatric ROS (+)  Hematology negative hematology ROS (+) Blood dyscrasia, anemia ,   Anesthesia Other Findings   Reproductive/Obstetrics negative OB ROS                         Anesthesia Physical Anesthesia Plan  ASA: III  Anesthesia Plan: MAC   Post-op Pain Management:    Induction:   Airway Management Planned: Simple Face Mask  Additional Equipment:   Intra-op Plan:   Post-operative Plan:   Informed Consent: I have reviewed the patients History and Physical, chart, labs and discussed the procedure including the risks, benefits and alternatives for the proposed anesthesia with the patient or authorized representative who has indicated his/her understanding and acceptance.   Dental advisory given  Plan Discussed with: CRNA  Anesthesia Plan Comments:         Anesthesia Quick Evaluation

## 2013-02-23 NOTE — Transfer of Care (Signed)
Immediate Anesthesia Transfer of Care Note  Patient: Jason Larsen  Procedure(s) Performed: Procedure(s) (LRB): COLONOSCOPY (N/A) ESOPHAGOGASTRODUODENOSCOPY (EGD) (N/A)  Patient Location: PACU  Anesthesia Type: MAC  Level of Consciousness: sedated, patient cooperative and responds to stimulaton  Airway & Oxygen Therapy: Patient Spontanous Breathing and Patient connected to face mask oxgen  Post-op Assessment: Report given to PACU RN and Post -op Vital signs reviewed and stable  Post vital signs: Reviewed and stable  Complications: No apparent anesthesia complications

## 2013-02-23 NOTE — Op Note (Signed)
[facility] [address] [place], [address]   COLONOSCOPY PROCEDURE REPORT  PATIENT: Jason Larsen, Jason Larsen  MR#: [id number] BIRTHDATE: January 30, 1941 , 71  yrs. old GENDER: Male ENDOSCOPIST: Louis Meckel, MD REFERRED [id number] Alphonsus Sias, M.D.  Storm Frisk, M.D. PROCEDURE DATE:  02/23/2013 PROCEDURE:   Colonoscopy with snare polypectomy and Colonoscopy with control of bleeding ASA CLASS:   Class III INDICATIONS:melena in the setting of elevated INR MEDICATIONS: MAC sedation, administered by CRNA  DESCRIPTION OF PROCEDURE:   After the risks benefits and alternatives of the procedure were thoroughly explained, informed consent was obtained.  A digital rectal exam revealed no abnormalities of the rectum.   The pentax [id number]  endoscope was introduced through the anus and advanced to the cecum, which was identified by both the appendix and ileocecal valve. No adverse events experienced.   The quality of the prep was Suprep excellent The instrument was then slowly withdrawn as the colon was fully examined.      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  There was fresh blood in the cecum and active oozing of blood from multiple AVMs.  The AVMs were obliterated utilizing the argon plasma coagulator at 150 W cut/40 W coag settings.  Hemostasis was achieved.   A sessile polyp measuring 2-3 mm in size was found in the rectum.  A polypectomy was performed with a cold snare.  The resection was complete and the polyp tissue was completely retrieved.   Mild diverticulosis was noted in the transverse colon. The colon mucosa was otherwise normal.  Retroflexed views revealed no abnormalities. The time to cecum=  .  Withdrawal time=24 minutes 0 seconds.  The scope was withdrawn and the procedure completed. COMPLICATIONS: There were no complications.  ENDOSCOPIC IMPRESSION: 1.   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  There was fresh blood in the  cecum and active oozing of blood from multiple AVMs. The AVMs were obliterated utilizing the argon plasma coagulator at 150 W cut/40 W coag settings.  Hemostasis was achieved. 2.   Sessile polyp measuring 2-3 mm in size was found in the rectum; polypectomy was performed with a cold snare 3.   Mild diverticulosis was noted in the transverse colon 4.   The colon mucosa was otherwise normal  RECOMMENDATIONS: resume Coumadin in a.m. Iron supplementation CBC in 2 weeks   eSigned:  Louis Meckel, MD 02/23/2013 1:13 PM   cc:   PATIENT NAME:  [name], [name] MR#: [id number]

## 2013-02-23 NOTE — Interval H&P Note (Signed)
History and Physical Interval Note:  02/23/2013 12:22 PM  Jason Larsen  has presented today for surgery, with the diagnosis of abdominal pain  The various methods of treatment have been discussed with the patient and family. After consideration of risks, benefits and other options for treatment, the patient has consented to  Procedure(s): COLONOSCOPY (N/A) ESOPHAGOGASTRODUODENOSCOPY (EGD) (N/A) as a surgical intervention .  The patient's history has been reviewed, patient examined, no change in status, stable for surgery.  I have reviewed the patient's chart and labs.  Questions were answered to the patient's satisfaction.    The recent H&P (dated *02/09/13**) was reviewed, the patient was examined and there is no change in the patients condition since that H&P was completed.   Melvia Heaps  02/23/2013, 12:22 PM    Melvia Heaps

## 2013-02-24 ENCOUNTER — Encounter: Payer: Self-pay | Admitting: Gastroenterology

## 2013-02-24 ENCOUNTER — Encounter (HOSPITAL_COMMUNITY): Payer: Self-pay | Admitting: Gastroenterology

## 2013-03-02 ENCOUNTER — Telehealth: Payer: Self-pay | Admitting: Gastroenterology

## 2013-03-02 NOTE — Telephone Encounter (Signed)
Pts appt moved to May 5th at 2:30pm. Pt aware of appt date and time.

## 2013-03-10 ENCOUNTER — Telehealth: Payer: Self-pay | Admitting: Critical Care Medicine

## 2013-03-10 MED ORDER — PREDNISONE 10 MG PO TABS: ORAL_TABLET | ORAL | Status: DC

## 2013-03-10 MED ORDER — DOXYCYCLINE HYCLATE 100 MG PO TABS: 100.0000 mg | ORAL_TABLET | Freq: Two times a day (BID) | ORAL | Status: DC

## 2013-03-10 NOTE — Telephone Encounter (Signed)
Per TP: pred taper #20, doxycycline 100mg  #14 1po bid.  If no better or worse, call back or go to ER.  Called spoke with patient's spouse, advised of TP's recs as stated above.  Marylu Lund to pick up rx's asap this morning and call back or seek emergency care if pt's symptoms worsen.  Pt to keep AM appt with TP.  Rx's sent to verified pharmacy.  Nothing further needed; will sign off.

## 2013-03-10 NOTE — Telephone Encounter (Signed)
Pt's spouse called back & wants to be called back ASAP.  Jason Larsen

## 2013-03-10 NOTE — Addendum Note (Signed)
Addended by: Boone Master E on: 03/10/2013 12:31 PM   Modules accepted: Orders

## 2013-03-10 NOTE — Telephone Encounter (Signed)
I spoke with pt and he c/o increase SOB--states he can't walk across the room (he is using 2.5 liters O2), can't really tell if his chest is tight d/t the SOB per pt, he is coughing up white phlem (at times it is a lot), and voice is raspy. Can't hear any wheezing. Pt is fine with having something called in today he bc he made an appt to see TP in the AM at 9. Please advise Tammy thanks  No Known Allergies

## 2013-03-11 ENCOUNTER — Encounter: Payer: Self-pay | Admitting: Adult Health

## 2013-03-11 ENCOUNTER — Other Ambulatory Visit: Payer: Self-pay | Admitting: Adult Health

## 2013-03-11 ENCOUNTER — Telehealth: Payer: Self-pay | Admitting: Adult Health

## 2013-03-11 ENCOUNTER — Ambulatory Visit (INDEPENDENT_AMBULATORY_CARE_PROVIDER_SITE_OTHER)
Admission: RE | Admit: 2013-03-11 | Discharge: 2013-03-11 | Disposition: A | Discharge status: Home / Self Care | Payer: Medicare Other | Source: Ambulatory Visit | Attending: Adult Health | Admitting: Adult Health

## 2013-03-11 ENCOUNTER — Ambulatory Visit (INDEPENDENT_AMBULATORY_CARE_PROVIDER_SITE_OTHER): Payer: Medicare Other | Admitting: Adult Health

## 2013-03-11 VITALS — BP 118/76 | HR 73 | Temp 97.3°F | Ht 64.0 in | Wt 150.0 lb

## 2013-03-11 DIAGNOSIS — J449 Chronic obstructive pulmonary disease, unspecified: Secondary | ICD-10-CM

## 2013-03-11 DIAGNOSIS — J4489 Other specified chronic obstructive pulmonary disease: Secondary | ICD-10-CM

## 2013-03-11 DIAGNOSIS — J189 Pneumonia, unspecified organism: Secondary | ICD-10-CM | POA: Insufficient documentation

## 2013-03-11 DIAGNOSIS — J40 Bronchitis, not specified as acute or chronic: Secondary | ICD-10-CM

## 2013-03-11 DIAGNOSIS — D869 Sarcoidosis, unspecified: Secondary | ICD-10-CM

## 2013-03-11 MED ORDER — MOXIFLOXACIN HCL 400 MG PO TABS: 400.0000 mg | ORAL_TABLET | Freq: Every day | ORAL | Status: DC

## 2013-03-11 NOTE — Progress Notes (Signed)
  Subjective:    Patient ID: Jason Larsen, male    DOB: October 08, 1941, 72 y.o.   MRN: [id number]  HPI 79 yowm active smoker followed by Dr Delford Field with known hx of COPD  Atrial fib on Amiodarone     03/11/2013 Acute OV  Complains of increased SOB, dry cough, fatigue x3 weeks Denies f/c/s, chest tightness.  Called in Doxycycline and steroid taper yesterday .  Recent colonoscopy done due to anemia-found AVM .  Coumadin was stopped due to severe anemia.  Not wearing O2 to office today .  We discussed wearing O2 with activity .  No smoking x 1 week.  Decreased leg swelling, wearing TED hose.  Feels a little better, cough is breaking loose.        Review of Systems  Constitutional:   No  weight loss, night sweats,  Fevers, chills, + fatigue, lassitude. HEENT:   No headaches,  Difficulty swallowing,  Tooth/dental problems,  Sore throat,                No sneezing, itching, ear ache, +nasal congestion, post nasal drip,   CV:  No chest pain,  Orthopnea, PND, swelling in lower extremities, anasarca, dizziness, palpitations  GI  No heartburn, indigestion, abdominal pain, nausea, vomiting, diarrhea, change in bowel habits, loss of appetite  Resp: Notes shortness of breath with exertion  + wheezing.  No chest wall deformity  Skin: no rash or lesions.  GU: no dysuria, change in color of urine, no urgency or frequency.  No flank pain.  MS:  No joint pain or swelling.  No decreased range of motion.  No back pain.  Psych:  No change in mood or affect. No depression or anxiety.  No memory loss.     Objective:   Physical Exam   Filed Vitals:   03/11/13 0923  BP: 118/76  Pulse: 73  Temp: 97.3 F (36.3 C)  TempSrc: Oral  Height: 5\' 4"  (1.626 m)  Weight: 150 lb (68.04 kg)  SpO2: 89%    Gen: Pleasant, well-nourished, in no distress,  normal affect  ENT: No lesions,  mouth clear,  oropharynx clear, no postnasal drip  Neck: No JVD, no TMG, no carotid bruits  Lungs: No use of  accessory muscles, no dullness to percussion , coarse BS w/ few exp wheezes   Cardiovascular: RRR, heart sounds normal, no murmur or gallops, no peripheral edema  Abdomen: soft and NT, no HSM,  BS normal  Musculoskeletal: No deformities, no cyanosis or clubbing  Neuro: alert, non focal  Skin: Warm, no lesions or rashes         Assessment & Plan:

## 2013-03-11 NOTE — Patient Instructions (Addendum)
Finish Doxycycline and Prednisone as directed.  Mucinex DM Twice daily As needed  Cough/congestion  I will call with xray results.  Fluids and rest.  Please contact office for sooner follow up if symptoms do not improve or worsen or seek emergency care  follow up Dr. Delford Field  In 1 month  Keep up good work on not smoking.  Wear Oxygen 3 l/m with walking and 2 l/m at rest.

## 2013-03-11 NOTE — Addendum Note (Signed)
Addended by: Orma Flaming D on: 03/11/2013 10:04 AM   Modules accepted: Orders

## 2013-03-11 NOTE — Progress Notes (Signed)
Quick Note:  LMOM TCB x1. Will call back Orders only encounter created for avelox rx ______

## 2013-03-11 NOTE — Telephone Encounter (Signed)
Spouse aware of results. appt has been made for 03/29/13 at 1:45 (okay'd by CJ). Nothing further was needed

## 2013-03-11 NOTE — Progress Notes (Signed)
Per 5.2.13 cxr result note:  Result Notes    Notes Recorded by Sherre Lain, MA on 03/11/2013 at 4:00 PM LMOM TCB x1. Will call back Orders only encounter created for avelox rx ------  Notes Recorded by Julio Sicks, NP on 03/11/2013 at 3:50 PM PNA -RML  Stop Doxycycline  Begin Avelox 400 mg daily for 7 days  follow up in 2 weeks with Dr. Delford Field With chest xray  .ftup    Rx sent to local pharmacy on file.

## 2013-03-11 NOTE — Assessment & Plan Note (Signed)
Exacerbation  Check xray today   Plan  Finish Doxycycline and Prednisone as directed.  Mucinex DM Twice daily As needed  Cough/congestion  I will call with xray results.  Fluids and rest.  Please contact office for sooner follow up if symptoms do not improve or worsen or seek emergency care  follow up Dr. Delford Field  In 1 month  Keep up good work on not smoking.  Wear Oxygen 3 l/m with walking and 2 l/m at rest.

## 2013-03-14 ENCOUNTER — Encounter: Payer: Self-pay | Admitting: Gastroenterology

## 2013-03-14 ENCOUNTER — Telehealth: Payer: Self-pay | Admitting: Critical Care Medicine

## 2013-03-14 ENCOUNTER — Ambulatory Visit (INDEPENDENT_AMBULATORY_CARE_PROVIDER_SITE_OTHER): Payer: Medicare Other | Admitting: Gastroenterology

## 2013-03-14 VITALS — BP 96/50 | HR 68 | Ht 64.0 in | Wt 148.4 lb

## 2013-03-14 DIAGNOSIS — R160 Hepatomegaly, not elsewhere classified: Secondary | ICD-10-CM

## 2013-03-14 DIAGNOSIS — J449 Chronic obstructive pulmonary disease, unspecified: Secondary | ICD-10-CM

## 2013-03-14 DIAGNOSIS — D649 Anemia, unspecified: Secondary | ICD-10-CM

## 2013-03-14 DIAGNOSIS — K5521 Angiodysplasia of colon with hemorrhage: Secondary | ICD-10-CM

## 2013-03-14 DIAGNOSIS — Q2733 Arteriovenous malformation of digestive system vessel: Secondary | ICD-10-CM

## 2013-03-14 DIAGNOSIS — D126 Benign neoplasm of colon, unspecified: Secondary | ICD-10-CM

## 2013-03-14 NOTE — Progress Notes (Signed)
History of Present Illness:  The patient has returned for followup of anemia and GI bleeding. Recent upper endoscopy was normal. AVMs in the cecum at colonoscopy were seen and were cauterized.  He was placed back on Coumadin. He denies change in stool color or passage of any blood. April 22 hemoglobin was 8.3. Today it is 9.0. In the last 2 years he has been running around 9.0.  Recent ultrasound for a low-lying liver did not demonstrate any specific abnormalities.    Review of Systems: Pertinent positive and negative review of systems were noted in the above HPI section. All other review of systems were otherwise negative.    Current Medications, Allergies, Past Medical History, Past Surgical History, Family History and Social History were reviewed in [employer] electronic medical record  Vital signs were reviewed in today's medical record. Physical Exam: General: Chronically ill appearing, no acute distress

## 2013-03-14 NOTE — Assessment & Plan Note (Signed)
No specific abnormalities by ultrasound. LFTs are normal. He probably has a low-lying liver

## 2013-03-14 NOTE — Patient Instructions (Addendum)
You have been given a separate informational sheet regarding your tobacco use, the importance of quitting and local resources to help you quit. You will go to the basement for a lab IFOB kit today Please return kit back to the lab within 2 weeks You will need a CBC in 2 and 4 weeks which you can have drawn here or at your PCP's office Gave you orders for your blood counts to take to [facility]

## 2013-03-14 NOTE — Telephone Encounter (Signed)
Let pt know I saw ONO on oxygen and he is normal with 2.5 Liters at night, that is he should stay on 2.5Liters QHS oxygen

## 2013-03-14 NOTE — Assessment & Plan Note (Signed)
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 He's had no overt bleeding since that time. Drop  in hemoglobin may have been lab error. His hemoglobin currently is at baseline.  Recommendations #1 check stool Hemoccults #2 check hemoglobin in 2 and in 6 weeks #3 continue iron supplementation

## 2013-03-14 NOTE — Assessment & Plan Note (Signed)
Hyperplastic polyp by pathology.

## 2013-03-15 NOTE — Telephone Encounter (Signed)
LMTCB

## 2013-03-15 NOTE — Telephone Encounter (Signed)
Pt is aware of ONO results. 

## 2013-03-23 ENCOUNTER — Ambulatory Visit: Payer: Medicare Other | Admitting: Gastroenterology

## 2013-03-27 ENCOUNTER — Emergency Department (HOSPITAL_COMMUNITY)
Admission: EM | Admit: 2013-03-27 | Discharge: 2013-03-27 | Disposition: A | Discharge status: Home / Self Care | Payer: Medicare Other | Attending: Emergency Medicine | Admitting: Emergency Medicine

## 2013-03-27 ENCOUNTER — Encounter (HOSPITAL_COMMUNITY): Payer: Self-pay | Admitting: Emergency Medicine

## 2013-03-27 DIAGNOSIS — I739 Peripheral vascular disease, unspecified: Secondary | ICD-10-CM | POA: Insufficient documentation

## 2013-03-27 DIAGNOSIS — W57XXXA Bitten or stung by nonvenomous insect and other nonvenomous arthropods, initial encounter: Secondary | ICD-10-CM | POA: Insufficient documentation

## 2013-03-27 DIAGNOSIS — J4489 Other specified chronic obstructive pulmonary disease: Secondary | ICD-10-CM | POA: Insufficient documentation

## 2013-03-27 DIAGNOSIS — E669 Obesity, unspecified: Secondary | ICD-10-CM | POA: Insufficient documentation

## 2013-03-27 DIAGNOSIS — F17200 Nicotine dependence, unspecified, uncomplicated: Secondary | ICD-10-CM | POA: Insufficient documentation

## 2013-03-27 DIAGNOSIS — M129 Arthropathy, unspecified: Secondary | ICD-10-CM | POA: Insufficient documentation

## 2013-03-27 DIAGNOSIS — S30860A Insect bite (nonvenomous) of lower back and pelvis, initial encounter: Secondary | ICD-10-CM | POA: Insufficient documentation

## 2013-03-27 DIAGNOSIS — E119 Type 2 diabetes mellitus without complications: Secondary | ICD-10-CM | POA: Insufficient documentation

## 2013-03-27 DIAGNOSIS — Z79899 Other long term (current) drug therapy: Secondary | ICD-10-CM | POA: Insufficient documentation

## 2013-03-27 DIAGNOSIS — Z7901 Long term (current) use of anticoagulants: Secondary | ICD-10-CM | POA: Insufficient documentation

## 2013-03-27 DIAGNOSIS — Z8709 Personal history of other diseases of the respiratory system: Secondary | ICD-10-CM | POA: Insufficient documentation

## 2013-03-27 DIAGNOSIS — Z7982 Long term (current) use of aspirin: Secondary | ICD-10-CM | POA: Insufficient documentation

## 2013-03-27 DIAGNOSIS — Y929 Unspecified place or not applicable: Secondary | ICD-10-CM | POA: Insufficient documentation

## 2013-03-27 DIAGNOSIS — R319 Hematuria, unspecified: Secondary | ICD-10-CM | POA: Insufficient documentation

## 2013-03-27 DIAGNOSIS — I4891 Unspecified atrial fibrillation: Secondary | ICD-10-CM | POA: Insufficient documentation

## 2013-03-27 DIAGNOSIS — E785 Hyperlipidemia, unspecified: Secondary | ICD-10-CM | POA: Insufficient documentation

## 2013-03-27 DIAGNOSIS — Y9389 Activity, other specified: Secondary | ICD-10-CM | POA: Insufficient documentation

## 2013-03-27 DIAGNOSIS — I10 Essential (primary) hypertension: Secondary | ICD-10-CM | POA: Insufficient documentation

## 2013-03-27 DIAGNOSIS — I2510 Atherosclerotic heart disease of native coronary artery without angina pectoris: Secondary | ICD-10-CM | POA: Insufficient documentation

## 2013-03-27 DIAGNOSIS — J449 Chronic obstructive pulmonary disease, unspecified: Secondary | ICD-10-CM | POA: Insufficient documentation

## 2013-03-27 DIAGNOSIS — Z951 Presence of aortocoronary bypass graft: Secondary | ICD-10-CM | POA: Insufficient documentation

## 2013-03-27 LAB — URINALYSIS, ROUTINE W REFLEX MICROSCOPIC
Glucose, UA: NEGATIVE mg/dL
Ketones, ur: 15 mg/dL — AB
Nitrite: NEGATIVE
Protein, ur: 100 mg/dL — AB
Specific Gravity, Urine: 1.021 (ref 1.005–1.030)
Urobilinogen, UA: 1 mg/dL (ref 0.0–1.0)
pH: 5.5 (ref 5.0–8.0)

## 2013-03-27 LAB — PROTIME-INR
INR: 2.2 — ABNORMAL HIGH (ref 0.00–1.49)
Prothrombin Time: 23.5 seconds — ABNORMAL HIGH (ref 11.6–15.2)

## 2013-03-27 LAB — CBC WITH DIFFERENTIAL/PLATELET
Eosinophils Absolute: 0.1 10*3/uL (ref 0.0–0.7)
Eosinophils Relative: 1 % (ref 0–5)
Hemoglobin: 10.2 g/dL — ABNORMAL LOW (ref 13.0–17.0)
Lymphocytes Relative: 12 % (ref 12–46)
MCH: 24.1 pg — ABNORMAL LOW (ref 26.0–34.0)
Monocytes Absolute: 0.6 10*3/uL (ref 0.1–1.0)
Neutrophils Relative %: 77 % (ref 43–77)
Platelets: 132 10*3/uL — ABNORMAL LOW (ref 150–400)
RBC: 4.23 MIL/uL (ref 4.22–5.81)
WBC: 6.4 10*3/uL (ref 4.0–10.5)

## 2013-03-27 LAB — URINE MICROSCOPIC-ADD ON

## 2013-03-27 MED ORDER — DOXYCYCLINE HYCLATE 100 MG PO TABS
200.0000 mg | ORAL_TABLET | Freq: Once | ORAL | Status: AC
  Administered 2013-03-27: 200 mg via ORAL
  Filled 2013-03-27: qty 2

## 2013-03-27 NOTE — ED Provider Notes (Signed)
History     CSN: [id number]  Arrival date & time 03/27/13  1929   First MD Initiated Contact with Patient 03/27/13 2011      Chief Complaint  Patient presents with  . Hematuria    (Consider location/radiation/quality/duration/timing/severity/associated sxs/prior treatment) HPI History provided by pt.   Pt presents w/ c/o gross hematuria x 3 today.  No prior history.  No associated fever, abd pain, dysuria, frequency, testicular pain, blood in stool.  Denies trauma.  Is on coumadin for atrial fibrillation. Past Medical History  Diagnosis Date  . Coronary heart disease   . Hyperlipidemia   . HTN (hypertension)   . COPD (chronic obstructive pulmonary disease)   . PVD (peripheral vascular disease)   . Atrial fibrillation     converted to NSR 5/09 on pacerone  . Shortness of breath     on exertion  . Diabetes mellitus without complication   . Arthritis     fingers    Past Surgical History  Procedure Laterality Date  . Joint replacement      RTH  . Coronary artery bypass graft    . Tonsillectomy      as child  . Colonoscopy N/A 02/23/2013    Procedure: COLONOSCOPY;  Surgeon: Louis Meckel, MD;  Location: [facility] ENDOSCOPY;  Service: Endoscopy;  Laterality: N/A;  . Esophagogastroduodenoscopy N/A 02/23/2013    Procedure: ESOPHAGOGASTRODUODENOSCOPY (EGD);  Surgeon: Louis Meckel, MD;  Location: [facility];  Service: Endoscopy;  Laterality: N/A;    Family History  Problem Relation Age of Onset  . Emphysema Father   . Heart disease Father   . Cancer Mother     unknwn type  . Tuberculosis Paternal Grandfather     History  Substance Use Topics  . Smoking status: Current Every Day Smoker -- 0.25 packs/day for 54 years    Types: Cigarettes  . Smokeless tobacco: Never Used     Comment: Started smoking at age 57.  Currently smoking a few cig per day.  . Alcohol Use: No     Comment: Beer -haven't drank since Dec 2013      Review of Systems  All other systems reviewed  and are negative.    Allergies  Review of patient's allergies indicates no known allergies.  Home Medications   Current Outpatient Rx  Name  Route  Sig  Dispense  Refill  . albuterol (PROVENTIL) (2.5 MG/3ML) 0.083% nebulizer solution   Nebulization   Take 2.5 mg by nebulization 2 (two) times daily as needed for shortness of breath.          . allopurinol (ZYLOPRIM) 300 MG tablet      1/2 tab by mouth once daily         . amiodarone (PACERONE) 200 MG tablet   Oral   Take 200 mg by mouth 2 (two) times daily.          Marland Kitchen aspirin 81 MG tablet   Oral   Take 81 mg by mouth at bedtime.          Marland Kitchen diltiazem (DILACOR XR) 120 MG 24 hr capsule   Oral   Take 1 capsule by mouth every morning.          . ferrous sulfate 325 (65 FE) MG tablet   Oral   Take 325 mg by mouth daily with breakfast.         . formoterol (FORADIL) 12 MCG capsule for inhaler   Inhalation   Place  12 mcg into inhaler and inhale 2 (two) times daily.         . furosemide (LASIX) 20 MG tablet   Oral   Take 20 mg by mouth 2 (two) times daily.          Marland Kitchen losartan (COZAAR) 50 MG tablet   Oral   Take 25 mg by mouth every morning.          . metFORMIN (GLUCOPHAGE) 500 MG tablet   Oral   Take 500 mg by mouth daily with breakfast.          . metoprolol tartrate (LOPRESSOR) 25 MG tablet      1 tab by mouth in the morning, 1/2 at bedtime         . moxifloxacin (AVELOX) 400 MG tablet   Oral   Take 1 tablet (400 mg total) by mouth daily.   7 tablet   0   . omeprazole (PRILOSEC) 20 MG capsule   Oral   Take 20 mg by mouth daily. 30 minutes before first meal of the day         . predniSONE (DELTASONE) 10 MG tablet      Take 4 tabs x2 days, then 3 tabs x2 days, 2 tabs x2 days, then 1 tab x2 days, then stop.   20 tablet   0   . simvastatin (ZOCOR) 20 MG tablet      1/2 by mouth daily at bedtime         . tiotropium (SPIRIVA) 18 MCG inhalation capsule   Inhalation   Place 18  mcg into inhaler and inhale daily.           Marland Kitchen warfarin (COUMADIN) 1 MG tablet   Oral   Take 1 mg by mouth daily.           BP 120/65  Pulse 62  Temp(Src) 98.2 F (36.8 C) (Oral)  Resp 18  SpO2 95%  Physical Exam  Nursing note and vitals reviewed. Constitutional: He is oriented to person, place, and time. He appears well-developed and well-nourished. No distress.  HENT:  Head: Normocephalic and atraumatic.  Eyes:  Normal appearance  Neck: Normal range of motion.  Cardiovascular: Normal rate and regular rhythm.   Pulmonary/Chest: Effort normal and breath sounds normal. No respiratory distress.  Abdominal: Soft. Bowel sounds are normal. He exhibits no distension and no mass. There is no tenderness. There is no rebound and no guarding.  Obese.  Tick in umbilicus.  Genitourinary:  No genitalia rash but there is a tick on head of penis.  Soft, spongy mass on ventral shaft of penis, just proximal to head.  Non-tender.  (pt reports that he first noticed it 1 month ago, it has been non-painful and it has markedly decreased in size).  No urethral discharge/blood.  Testicles descended bilaterally.  No masses.  No tenderness.     Musculoskeletal: Normal range of motion.  Neurological: He is alert and oriented to person, place, and time.  Skin: Skin is warm and dry. No rash noted.  Psychiatric: He has a normal mood and affect. His behavior is normal.    ED Course  Procedures (including critical care time)  Labs Reviewed  URINALYSIS, ROUTINE W REFLEX MICROSCOPIC - Abnormal; Notable for the following:    Color, Urine RED (*)    APPearance CLOUDY (*)    Hgb urine dipstick LARGE (*)    Bilirubin Urine MODERATE (*)    Ketones, ur 15 (*)  Protein, ur 100 (*)    Leukocytes, UA SMALL (*)    All other components within normal limits  PROTIME-INR - Abnormal; Notable for the following:    Prothrombin Time 23.5 (*)    INR 2.20 (*)    All other components within normal limits  CBC  WITH DIFFERENTIAL - Abnormal; Notable for the following:    Hemoglobin 10.2 (*)    HCT 35.1 (*)    MCH 24.1 (*)    MCHC 29.1 (*)    RDW 21.5 (*)    Platelets 132 (*)    All other components within normal limits  URINE MICROSCOPIC-ADD ON - Abnormal; Notable for the following:    Bacteria, UA FEW (*)    All other components within normal limits  URINE CULTURE   No results found.   1. Hematuria   2. Tick bite       MDM  71yo M on coumadin presents w/ gross hematuria x 3 days.  No prior history.  No associated sx.  INR 2.20 today.  Exam sig for spongy, non-tender mass just proximal to head of penis on ventral aspect.  Pt reports he first noticed this 1 month ago and it has markedly decreased in size.  There is also a tick on head of penis, as well as in umbilicus.  Nursing staff and myself inspected rest of skin and no other ticks.  Both removed by wiping w/ alcohol prep pad and then pulling off w/ tweezers.  Entire body of tick, including head, removed.  U/A negative for infection and hgb stable.  Results discussed w/ pt.  Referred to urology and return precautions discussed.  9:27 PM        Otilio Miu, PA-C 03/27/13 [phone]

## 2013-03-27 NOTE — ED Notes (Signed)
C/o hematuria that started today.  Denies any other symptoms.  Pt takes Coumadin.

## 2013-03-27 NOTE — ED Notes (Signed)
I gave the patient a pair of green extra large socks.

## 2013-03-28 LAB — URINE CULTURE
Colony Count: NO GROWTH
Culture: NO GROWTH

## 2013-03-28 NOTE — ED Provider Notes (Signed)
Medical screening examination/treatment/procedure(s) were performed by non-physician practitioner and as supervising physician I was immediately available for consultation/collaboration.   Jason Larsen. Oletta Lamas, MD 03/28/13 0981

## 2013-03-29 ENCOUNTER — Ambulatory Visit (INDEPENDENT_AMBULATORY_CARE_PROVIDER_SITE_OTHER): Payer: Medicare Other | Admitting: Critical Care Medicine

## 2013-03-29 ENCOUNTER — Telehealth: Payer: Self-pay | Admitting: Critical Care Medicine

## 2013-03-29 ENCOUNTER — Encounter: Payer: Self-pay | Admitting: Critical Care Medicine

## 2013-03-29 ENCOUNTER — Other Ambulatory Visit (INDEPENDENT_AMBULATORY_CARE_PROVIDER_SITE_OTHER): Payer: Medicare Other

## 2013-03-29 ENCOUNTER — Ambulatory Visit (INDEPENDENT_AMBULATORY_CARE_PROVIDER_SITE_OTHER)
Admission: RE | Admit: 2013-03-29 | Discharge: 2013-03-29 | Disposition: A | Discharge status: Home / Self Care | Payer: Medicare Other | Source: Ambulatory Visit | Attending: Critical Care Medicine | Admitting: Critical Care Medicine

## 2013-03-29 VITALS — BP 100/64 | HR 69 | Temp 97.5°F | Ht 64.0 in | Wt 147.8 lb

## 2013-03-29 DIAGNOSIS — J189 Pneumonia, unspecified organism: Secondary | ICD-10-CM

## 2013-03-29 DIAGNOSIS — J90 Pleural effusion, not elsewhere classified: Secondary | ICD-10-CM

## 2013-03-29 DIAGNOSIS — E876 Hypokalemia: Secondary | ICD-10-CM | POA: Insufficient documentation

## 2013-03-29 LAB — BASIC METABOLIC PANEL
Chloride: 98 mEq/L (ref 96–112)
GFR: 90.3 mL/min (ref >60.00)
Potassium: 2.8 mEq/L — CL (ref 3.5–5.1)
Sodium: 143 mEq/L (ref 135–145)

## 2013-03-29 MED ORDER — POTASSIUM CHLORIDE CRYS ER 20 MEQ PO TBCR: EXTENDED_RELEASE_TABLET | ORAL | Status: DC

## 2013-03-29 MED ORDER — FUROSEMIDE 20 MG PO TABS: ORAL_TABLET | ORAL | Status: DC

## 2013-03-29 NOTE — Telephone Encounter (Signed)
Pt needs to start potassium .  Take two daily , then one daily thereafter. # 30

## 2013-03-29 NOTE — Assessment & Plan Note (Signed)
Right pleural effusion status post community-acquired pneumonia Plan Thoracentesis Followup with CT scan of the chest after thoracentesis

## 2013-03-29 NOTE — Progress Notes (Signed)
Subjective:    Patient ID: Jason Larsen, male    DOB: Feb 07, 1941, 72 y.o.   MRN: [id number]  HPI 72 y.o. wm active smoker followed by Dr Delford Field with known hx of COPD  Atrial fib on Amiodarone      03/29/2013 Pt seen 5/20 for copd exac and PNA Rx avelox Pt now with progressively more dyspnea.  Pt with Large R effusion on CXR.  Less cough less mucus. White mucus. No blood.  Dyspnea. No chest pain .  ++orthopnea. No edema in feet.   Not smoking much. Notes some blood in urine  Review of Systems  Constitutional:   No  weight loss, night sweats,  Fevers, chills, + fatigue, lassitude. HEENT:   No headaches,  Difficulty swallowing,  Tooth/dental problems,  Sore throat,                No sneezing, itching, ear ache, +nasal congestion, post nasal drip,   CV:  No chest pain,  Orthopnea, PND, swelling in lower extremities, anasarca, dizziness, palpitations  GI  No heartburn, indigestion, abdominal pain, nausea, vomiting, diarrhea, change in bowel habits, loss of appetite  Resp: Notes shortness of breath with exertion  + wheezing.  No chest wall deformity  Skin: no rash or lesions.  GU: no dysuria, change in color of urine, no urgency or frequency.  No flank pain.  MS:  No joint pain or swelling.  No decreased range of motion.  No back pain.  Psych:  No change in mood or affect. No depression or anxiety.  No memory loss.     Objective:   Physical Exam BP 100/64  Pulse 69  Temp(Src) 97.5 F (36.4 C) (Oral)  Ht 5\' 4"  (1.626 m)  Wt 147 lb 12.8 oz (67.042 kg)  BMI 25.36 kg/m2  SpO2 92%  Gen: Pleasant, well-nourished, in no distress,  normal affect  ENT: No lesions,  mouth clear,  oropharynx clear, no postnasal drip  Neck: No JVD, no TMG, no carotid bruits  Lungs: No use of accessory muscles,dullness to percussion 1/2 Way up on the right , coarse BS w/ few exp wheezes , decreased breath sounds right lung  Cardiovascular: RRR, heart sounds normal, no murmur or gallops, no  peripheral edema  Abdomen: soft and NT, no HSM,  BS normal  Musculoskeletal: No deformities, no cyanosis or clubbing  Neuro: alert, non focal  Skin: Warm, no lesions or rashes   Recent Labs Lab 03/29/13 1433  NA 143  K 2.8*  CL 98  CO2 36*  GLUCOSE 106*  BUN 17  CREATININE 0.9  CALCIUM 8.9          Assessment & Plan:   CAP (community acquired pneumonia) Slow to resolve right middle lobe pneumonia status post Avelox for 7 days Now a large right pleural effusion is evident and will need to be drained but patient is anticoagulated with Coumadin at this time Plan Patient return in 3 days after off Coumadin therapy for a thoracentesis and then follow this with a CT scan of the chest Increase Lasix and administer potassium supplementation for low potassium  Hypokalemia Hypokalemia on the basis of Lasix administration Plan Potassium supplementation  Pleural effusion Right pleural effusion status post community-acquired pneumonia Plan Thoracentesis Followup with CT scan of the chest after thoracentesis   Updated Medication List Outpatient Encounter Prescriptions as of 03/29/2013  Medication Sig Dispense Refill  . albuterol (PROVENTIL) (2.5 MG/3ML) 0.083% nebulizer solution Take 2.5 mg by nebulization. 3-4 times  daily      . allopurinol (ZYLOPRIM) 300 MG tablet 1/2 tab by mouth once daily      . amiodarone (PACERONE) 200 MG tablet Take 200 mg by mouth 2 (two) times daily.       Marland Kitchen aspirin 81 MG tablet Take 81 mg by mouth at bedtime.       Marland Kitchen diltiazem (DILACOR XR) 120 MG 24 hr capsule Take 1 capsule by mouth every morning.       . ferrous sulfate 325 (65 FE) MG tablet Take 325 mg by mouth daily with breakfast.      . formoterol (FORADIL) 12 MCG capsule for inhaler Place 12 mcg into inhaler and inhale 2 (two) times daily.      . furosemide (LASIX) 20 MG tablet Two twice daily for one day then one twice daily  30 tablet    . losartan (COZAAR) 50 MG tablet Take 25 mg by  mouth every morning.       . metoprolol tartrate (LOPRESSOR) 25 MG tablet 1 tab by mouth in the morning, 1/2 at bedtime      . omeprazole (PRILOSEC) 20 MG capsule Take 20 mg by mouth daily. 30 minutes before first meal of the day      . simvastatin (ZOCOR) 20 MG tablet 1/2 by mouth daily at bedtime      . tiotropium (SPIRIVA) 18 MCG inhalation capsule Place 18 mcg into inhaler and inhale daily.        Marland Kitchen warfarin (COUMADIN) 1 MG tablet Take 1 mg by mouth daily.      . [DISCONTINUED] furosemide (LASIX) 20 MG tablet Take 20 mg by mouth 2 (two) times daily.       . [DISCONTINUED] metFORMIN (GLUCOPHAGE) 500 MG tablet Take 500 mg by mouth daily with breakfast.       . [DISCONTINUED] moxifloxacin (AVELOX) 400 MG tablet Take 1 tablet (400 mg total) by mouth daily.  7 tablet  0  . [DISCONTINUED] predniSONE (DELTASONE) 10 MG tablet Take 4 tabs x2 days, then 3 tabs x2 days, 2 tabs x2 days, then 1 tab x2 days, then stop.  20 tablet  0   No facility-administered encounter medications on file as of 03/29/2013.

## 2013-03-29 NOTE — Assessment & Plan Note (Signed)
Hypokalemia on the basis of Lasix administration Plan Potassium supplementation

## 2013-03-29 NOTE — Patient Instructions (Signed)
HOLD Coumadin for now Increase lasix to 40mg  twice a day for one day then 20mg  twice daily Return this Friday for thoracentesis and CT Chest with contrast

## 2013-03-29 NOTE — Telephone Encounter (Signed)
Pt's wife is aware of his abnormal K+ level. Rx will be sent to the pt's pharmacy.

## 2013-03-29 NOTE — Telephone Encounter (Signed)
Lab called with critical   Potassium 2.8  Dr Delford Field please advise Thank you

## 2013-03-29 NOTE — Assessment & Plan Note (Signed)
Slow to resolve right middle lobe pneumonia status post Avelox for 7 days Now a large right pleural effusion is evident and will need to be drained but patient is anticoagulated with Coumadin at this time Plan Patient return in 3 days after off Coumadin therapy for a thoracentesis and then follow this with a CT scan of the chest Increase Lasix and administer potassium supplementation for low potassium

## 2013-04-01 ENCOUNTER — Other Ambulatory Visit (HOSPITAL_COMMUNITY)
Admission: RE | Admit: 2013-04-01 | Discharge: 2013-04-01 | Disposition: A | Discharge status: Home / Self Care | Payer: Medicare Other | Source: Ambulatory Visit | Attending: Critical Care Medicine | Admitting: Critical Care Medicine

## 2013-04-01 ENCOUNTER — Telehealth: Payer: Self-pay | Admitting: Critical Care Medicine

## 2013-04-01 ENCOUNTER — Encounter: Payer: Self-pay | Admitting: Critical Care Medicine

## 2013-04-01 ENCOUNTER — Other Ambulatory Visit (INDEPENDENT_AMBULATORY_CARE_PROVIDER_SITE_OTHER): Payer: Medicare Other

## 2013-04-01 ENCOUNTER — Other Ambulatory Visit: Payer: Self-pay | Admitting: Critical Care Medicine

## 2013-04-01 ENCOUNTER — Ambulatory Visit (INDEPENDENT_AMBULATORY_CARE_PROVIDER_SITE_OTHER)
Admission: RE | Admit: 2013-04-01 | Discharge: 2013-04-01 | Disposition: A | Discharge status: Home / Self Care | Payer: Medicare Other | Source: Ambulatory Visit | Attending: Critical Care Medicine | Admitting: Critical Care Medicine

## 2013-04-01 ENCOUNTER — Ambulatory Visit (INDEPENDENT_AMBULATORY_CARE_PROVIDER_SITE_OTHER): Payer: Medicare Other | Admitting: Critical Care Medicine

## 2013-04-01 VITALS — BP 90/60 | HR 88 | Temp 97.8°F | Ht 64.0 in | Wt 147.0 lb

## 2013-04-01 DIAGNOSIS — J90 Pleural effusion, not elsewhere classified: Secondary | ICD-10-CM

## 2013-04-01 DIAGNOSIS — J189 Pneumonia, unspecified organism: Secondary | ICD-10-CM | POA: Insufficient documentation

## 2013-04-01 DIAGNOSIS — D649 Anemia, unspecified: Secondary | ICD-10-CM

## 2013-04-01 LAB — PH, BODY FLUID: pH, Fluid: 8.5

## 2013-04-01 MED ORDER — MOXIFLOXACIN HCL 400 MG PO TABS: 400.0000 mg | ORAL_TABLET | Freq: Every day | ORAL | Status: DC

## 2013-04-01 MED ORDER — IOHEXOL 300 MG/ML  SOLN
80.0000 mL | Freq: Once | INTRAMUSCULAR | Status: AC | PRN
  Administered 2013-04-01: 80 mL via INTRAVENOUS

## 2013-04-01 NOTE — Assessment & Plan Note (Signed)
Persistent right middle lobe airspace disease status post right thoracentesis. Likely Para pneumonic effusion Plan Administer additional antibiotics with Avelox Followup CT scan result Followup thoracentesis result

## 2013-04-01 NOTE — Telephone Encounter (Signed)
Spoke to pt's wife. Answered all of the questions that she had about his CT scan results. Nothing further was needed.

## 2013-04-01 NOTE — Patient Instructions (Signed)
Resume coumadin Monday Get CT Scan done today No change in other medications Return 1 week

## 2013-04-01 NOTE — Assessment & Plan Note (Signed)
Right effusion status post 1.8 Liter removal on 04/01/2013 Plan Followup pleural fluid studies Obtain CT scan of the chest

## 2013-04-01 NOTE — Progress Notes (Signed)
Subjective:    Patient ID: Jason Larsen, male    DOB: 1941/05/30, 72 y.o.   MRN: [id number]  HPI 72 y.o. wm active smoker followed by Dr Delford Field with known hx of COPD  Atrial fib on Amiodarone      03/29/2013 Pt seen 5/20 for copd exac and PNA Rx avelox Pt now with progressively more dyspnea.  Pt with Large R effusion on CXR.  Less cough less mucus. White mucus. No blood.  Dyspnea. No chest pain .  ++orthopnea. No edema in feet.   Not smoking much. Notes some blood in urine  04/01/2013 The patient is here for a right thoracentesis see note below Review of Systems  Constitutional:   No  weight loss, night sweats,  Fevers, chills, + fatigue, lassitude. HEENT:   No headaches,  Difficulty swallowing,  Tooth/dental problems,  Sore throat,                No sneezing, itching, ear ache, +nasal congestion, post nasal drip,   CV:  No chest pain,  Orthopnea, PND, swelling in lower extremities, anasarca, dizziness, palpitations  GI  No heartburn, indigestion, abdominal pain, nausea, vomiting, diarrhea, change in bowel habits, loss of appetite  Resp: Notes shortness of breath with exertion  + wheezing.  No chest wall deformity  Skin: no rash or lesions.  GU: no dysuria, change in color of urine, no urgency or frequency.  No flank pain.  MS:  No joint pain or swelling.  No decreased range of motion.  No back pain.  Psych:  No change in mood or affect. No depression or anxiety.  No memory loss.     Objective:   Physical Exam BP 90/60  Pulse 88  Temp(Src) 97.8 F (36.6 C) (Oral)  Ht 5\' 4"  (1.626 m)  Wt 147 lb (66.679 kg)  BMI 25.22 kg/m2  SpO2 90%  Gen: Pleasant, well-nourished, in no distress,  normal affect  ENT: No lesions,  mouth clear,  oropharynx clear, no postnasal drip  Neck: No JVD, no TMG, no carotid bruits  Lungs: No use of accessory muscles,dullness to percussion 1/2 Way up on the right , coarse BS w/ few exp wheezes , decreased breath sounds right  lung  Cardiovascular: RRR, heart sounds normal, no murmur or gallops, no peripheral edema  Abdomen: soft and NT, no HSM,  BS normal  Musculoskeletal: No deformities, no cyanosis or clubbing  Neuro: alert, non focal  Skin: Warm, no lesions or rashes  A right thoracentesis was performed without difficulty. 1.8 L was removed. The fluid was serous in nature. Thoracentesis Procedure Note  Pre-operative Diagnosis: R pleural effusion  Post-operative Diagnosis: same  Indications: R pleural effusion  Procedure Details  Consent: Informed consent was obtained. Risks of the procedure were discussed including: infection, bleeding, pain, pneumothorax.  Under sterile conditions the patient was positioned.Chlorhexidene and sterile drapes were utilized.  2% buffered lidocaine was used to anesthetize the 8th R  rib space. Fluid was obtained without any difficulties and minimal blood loss.  A dressing was applied to the wound and wound care instructions were provided.   Findings 1800 ml of clear pleural fluid was obtained. A sample was sent to Pathology for cytogenetics, flow, and cell counts, as well as for infection analysis.  Complications:  None; patient tolerated the procedure well.          Condition: stable  Plan A follow up CT chest  was ordered.   Recent Labs Lab 03/29/13 1433  NA 143  K 2.8*  CL 98  CO2 36*  GLUCOSE 106*  BUN 17  CREATININE 0.9  CALCIUM 8.9   Ct Chest W Contrast  04/01/2013   *RADIOLOGY REPORT*  Clinical Data: Status post thoracentesis.  Recent pneumonia.  CT CHEST WITH CONTRAST  Technique:  Multidetector CT imaging of the chest was performed following the standard protocol during bolus administration of intravenous contrast.  Contrast: 80mL OMNIPAQUE IOHEXOL 300 MG/ML  SOLN  Comparison: 03/29/2013  Findings: There are small bilateral pleural effusions right greater than left.  Moderate to advanced changes of centrilobular emphysema identified. Ground-glass  ground-glass attenuation is noted within the right middle lobe.  Scarring and architectural distortion noted within the superior segment of right lower lobe.  The trachea is patent and midline.  The right paratracheal lymph node measures 1.4 cm, image 19/series 2.  The subcarinal lymph node measures 1 cm, image 28/series 2.  There is a left hilar lymph node which measures 1.2 cm, image 29/series 2.  No supraclavicular or axillary adenopathy.  Diffuse increased attenuation throughout the liver parenchyma is noted which may be seen within the ureter and therapy.  The adrenal glands are both normal.  Review of the visualized osseous structures is significant for thoracic spondylosis. There are chronic compression deformities involving the T9 and T12 vertebra with loss approximately 15% of the vertebral body height.  IMPRESSION:  1.  Small bilateral pleural effusions. 2.  Nonspecific ground-glass attenuation is noted within the right middle lobe.  There is scarring and architectural distortion within the right lower lobe. 3.  Mildly enlarged right paratracheal, subcarinal and left hilar lymph nodes.   Original Report Authenticated By: Signa Kell, M.D.          Assessment & Plan:   Pleural effusion Right effusion status post 1.8 Liter removal on 04/01/2013 Plan Followup pleural fluid studies Obtain CT scan of the chest  CAP (community acquired pneumonia) Persistent right middle lobe airspace disease status post right thoracentesis. Likely Para pneumonic effusion Plan Administer additional antibiotics with Avelox Followup CT scan result Followup thoracentesis result    Updated Medication List Outpatient Encounter Prescriptions as of 04/01/2013  Medication Sig Dispense Refill  . albuterol (PROVENTIL) (2.5 MG/3ML) 0.083% nebulizer solution Take 2.5 mg by nebulization. 3-4 times daily      . allopurinol (ZYLOPRIM) 300 MG tablet 1/2 tab by mouth once daily      . amiodarone (PACERONE) 200 MG  tablet Take 200 mg by mouth 2 (two) times daily.       Marland Kitchen aspirin 81 MG tablet Take 81 mg by mouth at bedtime.       Marland Kitchen diltiazem (DILACOR XR) 120 MG 24 hr capsule Take 1 capsule by mouth every morning.       . ferrous sulfate 325 (65 FE) MG tablet Take 325 mg by mouth daily with breakfast.      . formoterol (FORADIL) 12 MCG capsule for inhaler Place 12 mcg into inhaler and inhale 2 (two) times daily.      . furosemide (LASIX) 20 MG tablet Two twice daily for one day then one twice daily  30 tablet    . losartan (COZAAR) 50 MG tablet Take 25 mg by mouth every morning.       . metoprolol tartrate (LOPRESSOR) 25 MG tablet 1 tab by mouth in the morning, 1/2 at bedtime      . omeprazole (PRILOSEC) 20 MG capsule Take 20 mg by mouth daily. 30 minutes before  first meal of the day      . potassium chloride SA (KLOR-CON M20) 20 MEQ tablet Take two daily, then one daily thereafter  30 tablet  0  . simvastatin (ZOCOR) 20 MG tablet 1/2 by mouth daily at bedtime      . tiotropium (SPIRIVA) 18 MCG inhalation capsule Place 18 mcg into inhaler and inhale daily.        Marland Kitchen warfarin (COUMADIN) 1 MG tablet Take 1 mg by mouth daily.       No facility-administered encounter medications on file as of 04/01/2013.

## 2013-04-02 LAB — AMYLASE, BODY FLUID: Amylase, Fluid: 24 U/L

## 2013-04-02 LAB — LACTATE DEHYDROGENASE, PLEURAL OR PERITONEAL FLUID: LD, Fluid: 82 U/L — ABNORMAL HIGH (ref 3–23)

## 2013-04-05 ENCOUNTER — Telehealth: Payer: Self-pay

## 2013-04-05 ENCOUNTER — Other Ambulatory Visit: Payer: Self-pay | Admitting: Gastroenterology

## 2013-04-05 DIAGNOSIS — K921 Melena: Secondary | ICD-10-CM

## 2013-04-05 LAB — BODY FLUID CELL COUNT WITH DIFFERENTIAL
Eos, Fluid: 0 %
Lymphs, Fluid: 80 %
Monocyte-Macrophage-Serous Fluid: 4 %
Neutrophil Count, Fluid: 16 % (ref 0–25)
Total Nucleated Cell Count, Fluid: 215 cu mm (ref 0–1000)

## 2013-04-05 NOTE — Telephone Encounter (Signed)
Pt instructed on prep and instructions for capsule endo. Pt scheduled for procedure 04/12/13. Pt to call back if any problem with appt.

## 2013-04-06 ENCOUNTER — Encounter: Payer: Self-pay | Admitting: Gastroenterology

## 2013-04-07 NOTE — Progress Notes (Signed)
Quick Note:  Call pt and tell all labs from lung fluid normal, Fluid is just water On lungs from his heart condition, he needs to finish his antibiotics ______

## 2013-04-07 NOTE — Progress Notes (Signed)
Quick Note:  Called, spoke with pt. Informed him of results and recs per Dr. Wright. He verbalized understanding and voiced no further questions or concerns at this time. ______ 

## 2013-04-08 ENCOUNTER — Ambulatory Visit: Payer: Medicare Other | Admitting: Critical Care Medicine

## 2013-04-10 ENCOUNTER — Other Ambulatory Visit: Payer: Self-pay | Admitting: Cardiovascular Disease

## 2013-04-11 ENCOUNTER — Ambulatory Visit: Payer: Medicare Other | Admitting: Adult Health

## 2013-04-11 ENCOUNTER — Ambulatory Visit: Payer: Medicare Other | Admitting: Critical Care Medicine

## 2013-04-11 ENCOUNTER — Encounter: Payer: Self-pay | Admitting: Critical Care Medicine

## 2013-04-11 ENCOUNTER — Ambulatory Visit (INDEPENDENT_AMBULATORY_CARE_PROVIDER_SITE_OTHER): Payer: Medicare Other | Admitting: Critical Care Medicine

## 2013-04-11 ENCOUNTER — Other Ambulatory Visit (INDEPENDENT_AMBULATORY_CARE_PROVIDER_SITE_OTHER): Payer: Medicare Other

## 2013-04-11 VITALS — BP 100/60 | HR 65 | Temp 97.1°F | Ht 64.0 in | Wt 147.0 lb

## 2013-04-11 DIAGNOSIS — J189 Pneumonia, unspecified organism: Secondary | ICD-10-CM

## 2013-04-11 DIAGNOSIS — E876 Hypokalemia: Secondary | ICD-10-CM

## 2013-04-11 DIAGNOSIS — J90 Pleural effusion, not elsewhere classified: Secondary | ICD-10-CM

## 2013-04-11 LAB — BASIC METABOLIC PANEL
BUN: 15 mg/dL (ref 6–23)
CO2: 29 mEq/L (ref 19–32)
Chloride: 100 mEq/L (ref 96–112)
Creatinine, Ser: 1 mg/dL (ref 0.4–1.5)
Glucose, Bld: 187 mg/dL — ABNORMAL HIGH (ref 70–99)

## 2013-04-11 MED ORDER — POTASSIUM CHLORIDE CRYS ER 20 MEQ PO TBCR: EXTENDED_RELEASE_TABLET | ORAL | Status: DC

## 2013-04-11 NOTE — Patient Instructions (Addendum)
Labs today to check potassium No other medication changes Return 3 months

## 2013-04-11 NOTE — Progress Notes (Signed)
Subjective:    Patient ID: Jason Larsen, male    DOB: 1941-10-19, 72 y.o.   MRN: [id number]  HPI 72 y.o. wm active smoker followed by Dr Delford Field with known hx of COPD  Atrial fib on Amiodarone   04/11/2013 At last OV we performed a thoracentesis, results showed it was a transudate.  C/S neg. Cytology neg for CA Since fluid removal is better.  Min cough, and is a sl rattle. Pt will cough a lot when gets on the edge of the bed then will be ok.  Pt for small bowel capsule endoscopy Min smoking  Review of Systems  Constitutional:   No  weight loss, night sweats,  Fevers, chills, + fatigue, lassitude. HEENT:   No headaches,  Difficulty swallowing,  Tooth/dental problems,  Sore throat,                No sneezing, itching, ear ache, +nasal congestion, post nasal drip,   CV:  No chest pain,  Orthopnea, PND, swelling in lower extremities, anasarca, dizziness, palpitations  GI  No heartburn, indigestion, abdominal pain, nausea, vomiting, diarrhea, change in bowel habits, loss of appetite  Resp: Notes shortness of breath with exertion  + wheezing.  No chest wall deformity  Skin: no rash or lesions.  GU: no dysuria, change in color of urine, no urgency or frequency.  No flank pain.  MS:  No joint pain or swelling.  No decreased range of motion.  No back pain.  Psych:  No change in mood or affect. No depression or anxiety.  No memory loss.     Objective:   Physical Exam BP 100/60  Pulse 65  Temp(Src) 97.1 F (36.2 C) (Oral)  Ht 5\' 4"  (1.626 m)  Wt 147 lb (66.679 kg)  BMI 25.22 kg/m2  SpO2 95%  Gen: Pleasant, well-nourished, in no distress,  normal affect  ENT: No lesions,  mouth clear,  oropharynx clear, no postnasal drip  Neck: No JVD, no TMG, no carotid bruits  Lungs: No use of accessory muscles, coarse breath sounds with improved aeration right lung  Cardiovascular: RRR, heart sounds normal, no murmur or gallops, no peripheral edema  Abdomen: soft and NT, no HSM,  BS  normal  Musculoskeletal: No deformities, no cyanosis or clubbing  Neuro: alert, non focal  Skin: Warm, no lesions or rashes          Assessment & Plan:   Hypokalemia Hypokalemia now resolved with potassium supplementation Continue potassium  20 milliequivalents daily  Pleural effusion Transudate of right pleural effusion status post thoracentesis without evidence of recurrence Followup expectedly  CAP (community acquired pneumonia) Right middle lobe pneumonia community acquired now resolved No additional imaging needed No additional antibiotics indicated    Updated Medication List Outpatient Encounter Prescriptions as of 04/11/2013  Medication Sig Dispense Refill  . albuterol (PROVENTIL) (2.5 MG/3ML) 0.083% nebulizer solution Take 2.5 mg by nebulization. 3-4 times daily      . allopurinol (ZYLOPRIM) 300 MG tablet 1/2 tab by mouth once daily      . amiodarone (PACERONE) 200 MG tablet Take 200 mg by mouth 2 (two) times daily.       Marland Kitchen aspirin 81 MG tablet Take 81 mg by mouth at bedtime.       Marland Kitchen diltiazem (DILACOR XR) 120 MG 24 hr capsule Take 1 capsule by mouth every morning.       . ferrous sulfate 325 (65 FE) MG tablet Take 325 mg by mouth daily with  breakfast.      . formoterol (FORADIL) 12 MCG capsule for inhaler Place 12 mcg into inhaler and inhale 2 (two) times daily.      . furosemide (LASIX) 20 MG tablet Two twice daily for one day then one twice daily  30 tablet    . losartan (COZAAR) 50 MG tablet Take 25 mg by mouth every morning.       . metoprolol tartrate (LOPRESSOR) 25 MG tablet 1 tab by mouth in the morning, 1/2 at bedtime      . omeprazole (PRILOSEC) 20 MG capsule Take 20 mg by mouth daily. 30 minutes before first meal of the day      . potassium chloride SA (KLOR-CON M20) 20 MEQ tablet Take two daily, then one daily thereafter  30 tablet  0  . simvastatin (ZOCOR) 20 MG tablet 1/2 by mouth daily at bedtime      . tiotropium (SPIRIVA) 18 MCG inhalation capsule  Place 18 mcg into inhaler and inhale daily.        Marland Kitchen warfarin (COUMADIN) 1 MG tablet Take 1 mg by mouth daily.      . [DISCONTINUED] moxifloxacin (AVELOX) 400 MG tablet Take 1 tablet (400 mg total) by mouth daily.  7 tablet  0   No facility-administered encounter medications on file as of 04/11/2013.

## 2013-04-11 NOTE — Assessment & Plan Note (Signed)
Hypokalemia now resolved with potassium supplementation Continue potassium  20 milliequivalents daily

## 2013-04-11 NOTE — Assessment & Plan Note (Signed)
Right middle lobe pneumonia community acquired now resolved No additional imaging needed No additional antibiotics indicated

## 2013-04-11 NOTE — Assessment & Plan Note (Signed)
Transudate of right pleural effusion status post thoracentesis without evidence of recurrence Followup expectedly

## 2013-04-11 NOTE — Progress Notes (Signed)
Quick Note:  Call pt and tell his labs are ok, No change in medications, he needs to stay on one potassium a day, we will send in 90day supply ______

## 2013-04-12 ENCOUNTER — Ambulatory Visit (INDEPENDENT_AMBULATORY_CARE_PROVIDER_SITE_OTHER): Payer: Medicare Other | Admitting: Gastroenterology

## 2013-04-12 ENCOUNTER — Telehealth: Payer: Self-pay | Admitting: *Deleted

## 2013-04-12 DIAGNOSIS — K922 Gastrointestinal hemorrhage, unspecified: Secondary | ICD-10-CM

## 2013-04-12 DIAGNOSIS — J398 Other specified diseases of upper respiratory tract: Secondary | ICD-10-CM

## 2013-04-12 NOTE — Telephone Encounter (Signed)
Done, confirmed by pharmacy

## 2013-04-12 NOTE — Progress Notes (Signed)
Patient here today for capsule endo.  He verbalized understanding of all instructions written and verbal.    Capsule endo [id number] lot # [id number]  Exp 05/2014

## 2013-04-12 NOTE — Telephone Encounter (Signed)
Pt still waiting for his pacerone-been waiting since last week! Please call today to [facility] [phone]!

## 2013-04-12 NOTE — Progress Notes (Signed)
Quick Note:  Called, spoke with pt. Informed him of labs results and recs per Dr. Delford Field. He verbalized understanding and is aware K rx was sent to [facility] on 04/11/13. He voiced no further questions or concerns at this time and is to call back if anything further is needed. ______

## 2013-04-12 NOTE — Telephone Encounter (Signed)
Still waiting to get his pacerone-the pharmacist said they had not heard from you-please call it in today-Call to [facility]-[phone]

## 2013-04-13 ENCOUNTER — Other Ambulatory Visit: Payer: Self-pay | Admitting: Cardiovascular Disease

## 2013-04-14 ENCOUNTER — Telehealth: Payer: Self-pay | Admitting: Critical Care Medicine

## 2013-04-14 MED ORDER — POTASSIUM CHLORIDE CRYS ER 20 MEQ PO TBCR: EXTENDED_RELEASE_TABLET | ORAL | Status: DC

## 2013-04-14 NOTE — Telephone Encounter (Signed)
Pt aware RX sent. He stated he had already cancelled rx at [facility]. Nothing further was needed

## 2013-04-15 ENCOUNTER — Telehealth: Payer: Self-pay | Admitting: Critical Care Medicine

## 2013-04-15 MED ORDER — POTASSIUM CHLORIDE CRYS ER 20 MEQ PO TBCR: 20.0000 meq | EXTENDED_RELEASE_TABLET | Freq: Every day | ORAL | Status: DC

## 2013-04-15 NOTE — Telephone Encounter (Signed)
Spoke to pt's wife. Rx has been fixed and re-sent to the pharmacy. Nothing further was needed.

## 2013-04-19 ENCOUNTER — Telehealth: Payer: Self-pay | Admitting: Critical Care Medicine

## 2013-04-19 ENCOUNTER — Other Ambulatory Visit: Payer: Self-pay | Admitting: *Deleted

## 2013-04-19 ENCOUNTER — Ambulatory Visit (INDEPENDENT_AMBULATORY_CARE_PROVIDER_SITE_OTHER): Payer: Medicare Other | Admitting: Cardiovascular Disease

## 2013-04-19 VITALS — BP 92/60 | HR 57 | Ht 64.0 in | Wt 137.3 lb

## 2013-04-19 DIAGNOSIS — E785 Hyperlipidemia, unspecified: Secondary | ICD-10-CM

## 2013-04-19 DIAGNOSIS — I259 Chronic ischemic heart disease, unspecified: Secondary | ICD-10-CM

## 2013-04-19 DIAGNOSIS — I2510 Atherosclerotic heart disease of native coronary artery without angina pectoris: Secondary | ICD-10-CM

## 2013-04-19 DIAGNOSIS — F17200 Nicotine dependence, unspecified, uncomplicated: Secondary | ICD-10-CM

## 2013-04-19 DIAGNOSIS — Z8679 Personal history of other diseases of the circulatory system: Secondary | ICD-10-CM

## 2013-04-19 DIAGNOSIS — I4891 Unspecified atrial fibrillation: Secondary | ICD-10-CM

## 2013-04-19 DIAGNOSIS — Q2733 Arteriovenous malformation of digestive system vessel: Secondary | ICD-10-CM

## 2013-04-19 DIAGNOSIS — J449 Chronic obstructive pulmonary disease, unspecified: Secondary | ICD-10-CM

## 2013-04-19 DIAGNOSIS — K5521 Angiodysplasia of colon with hemorrhage: Secondary | ICD-10-CM

## 2013-04-19 DIAGNOSIS — D649 Anemia, unspecified: Secondary | ICD-10-CM

## 2013-04-19 DIAGNOSIS — I10 Essential (primary) hypertension: Secondary | ICD-10-CM

## 2013-04-19 MED ORDER — AMIODARONE HCL 200 MG PO TABS: ORAL_TABLET | ORAL | Status: DC

## 2013-04-19 MED ORDER — DILTIAZEM HCL ER 120 MG PO CP24: 120.0000 mg | ORAL_CAPSULE | Freq: Every morning | ORAL | Status: AC

## 2013-04-19 NOTE — Patient Instructions (Signed)
Your physician has recommended you make the following change in your medication: Decrease your amiodarone down to 300 mg daily.   Your physician recommends that you schedule a follow-up appointment in: 2 months.

## 2013-04-19 NOTE — Telephone Encounter (Signed)
I spoke with the Jason Larsen and he states he forgot to ask Dr. Delford Field at his last visit if he could order him a more portable oxygen tank. According to chart Jason Larsen uses 3 liters with exertion. Jason Larsen states all he has right now are the heavy metal tanks and he cannot handle these very well. I advised the Jason Larsen that I will send an order to have his DME evaluate him for a more portable system. Carron Curie, CMA

## 2013-04-20 ENCOUNTER — Telehealth: Payer: Self-pay | Admitting: Cardiovascular Disease

## 2013-04-20 NOTE — Telephone Encounter (Signed)
Wants to know if Dr Tresa Endo wants Jason Larsen to continue taking him Coumadin?

## 2013-04-21 NOTE — Telephone Encounter (Signed)
Message forwarded to K. Alvstad, PharmD.  Paper chart requested.

## 2013-04-21 NOTE — Telephone Encounter (Signed)
LMOM that we will confirm with Dr. Tresa Endo when he is back in office next week.

## 2013-04-21 NOTE — Telephone Encounter (Signed)
Pt's wife called stating that she would like to get a return call about whether or not her husband needs to take his coumadin.

## 2013-04-25 NOTE — Telephone Encounter (Signed)
Wife called-wants to know if he needs to stop his coumadin completely?

## 2013-04-26 NOTE — Telephone Encounter (Signed)
Message forwarded to K. Alvstad, PharmD.  Please contact pt/wife with instructions on coumadin.

## 2013-04-27 ENCOUNTER — Other Ambulatory Visit: Payer: Self-pay | Admitting: Gastroenterology

## 2013-04-27 ENCOUNTER — Telehealth: Payer: Self-pay | Admitting: Gastroenterology

## 2013-04-27 ENCOUNTER — Telehealth: Payer: Self-pay

## 2013-04-27 DIAGNOSIS — Q2730 Arteriovenous malformation, site unspecified: Secondary | ICD-10-CM

## 2013-04-27 NOTE — Telephone Encounter (Signed)
Left message for pt to call back  °

## 2013-04-27 NOTE — Telephone Encounter (Signed)
Pt scheduled for small bowel enteroscopy with erbe at [facility] 05/04/13 @12 :30pm. Pt to arrive at the hospital at 11am. Pt to be clear liquids after midnight, may have clear liquids up until 8:30am. Pt aware of appt date and time.

## 2013-04-27 NOTE — Telephone Encounter (Signed)
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 Patient is Hemoccult positive. I would like to proceed with small bowel enteroscopy with ERBE ------

## 2013-04-28 ENCOUNTER — Encounter: Payer: Self-pay | Admitting: Gastroenterology

## 2013-04-28 NOTE — Telephone Encounter (Signed)
Went over instructions again with patient abut his procedure

## 2013-04-29 ENCOUNTER — Telehealth: Payer: Self-pay | Admitting: Gastroenterology

## 2013-04-29 ENCOUNTER — Other Ambulatory Visit: Payer: Self-pay | Admitting: *Deleted

## 2013-04-29 DIAGNOSIS — K5520 Angiodysplasia of colon without hemorrhage: Secondary | ICD-10-CM

## 2013-04-29 NOTE — Telephone Encounter (Signed)
Pt called back and stated he found a ride for the same time and day. Reentered the orders; Bonita Quin please make sure they are OK. Noreene Larsson said I left something out but fixed it. Thanks.

## 2013-04-29 NOTE — Telephone Encounter (Signed)
Spoke with pt to inform him the hospital will allow him to be dropped off and then picked up at a later time unlike our office. Pt states he can't come at that time, but can come any other day next week. Explained to pt I will have Dr Marzetta Board nurse call him on Monday to schedule since she is more familiar with his schedule. Pt stated understanding.

## 2013-05-02 ENCOUNTER — Encounter (HOSPITAL_COMMUNITY): Payer: Self-pay | Admitting: Pharmacy Technician

## 2013-05-02 ENCOUNTER — Encounter (HOSPITAL_COMMUNITY): Payer: Self-pay | Admitting: *Deleted

## 2013-05-04 ENCOUNTER — Encounter (HOSPITAL_COMMUNITY)
Admission: RE | Disposition: A | Discharge status: Home / Self Care | Payer: Self-pay | Source: Ambulatory Visit | Attending: Gastroenterology

## 2013-05-04 ENCOUNTER — Ambulatory Visit (HOSPITAL_COMMUNITY): Payer: Medicare Other | Admitting: Anesthesiology

## 2013-05-04 ENCOUNTER — Ambulatory Visit (HOSPITAL_COMMUNITY): Admission: RE | Admit: 2013-05-04 | Payer: Medicare Other | Source: Ambulatory Visit | Admitting: Gastroenterology

## 2013-05-04 ENCOUNTER — Encounter (HOSPITAL_COMMUNITY): Payer: Self-pay

## 2013-05-04 ENCOUNTER — Encounter (HOSPITAL_COMMUNITY): Payer: Self-pay | Admitting: Anesthesiology

## 2013-05-04 ENCOUNTER — Encounter (HOSPITAL_COMMUNITY): Admission: RE | Payer: Self-pay | Source: Ambulatory Visit

## 2013-05-04 ENCOUNTER — Ambulatory Visit (HOSPITAL_COMMUNITY)
Admission: RE | Admit: 2013-05-04 | Discharge: 2013-05-04 | Disposition: A | Discharge status: Home / Self Care | Payer: Medicare Other | Source: Ambulatory Visit | Attending: Gastroenterology | Admitting: Gastroenterology

## 2013-05-04 DIAGNOSIS — Q2733 Arteriovenous malformation of digestive system vessel: Secondary | ICD-10-CM

## 2013-05-04 DIAGNOSIS — K922 Gastrointestinal hemorrhage, unspecified: Secondary | ICD-10-CM | POA: Insufficient documentation

## 2013-05-04 DIAGNOSIS — K5520 Angiodysplasia of colon without hemorrhage: Secondary | ICD-10-CM

## 2013-05-04 DIAGNOSIS — D649 Anemia, unspecified: Secondary | ICD-10-CM | POA: Insufficient documentation

## 2013-05-04 DIAGNOSIS — Q2730 Arteriovenous malformation, site unspecified: Secondary | ICD-10-CM

## 2013-05-04 DIAGNOSIS — K31819 Angiodysplasia of stomach and duodenum without bleeding: Secondary | ICD-10-CM | POA: Insufficient documentation

## 2013-05-04 HISTORY — DX: Heart failure, unspecified: I50.9

## 2013-05-04 HISTORY — PX: ENTEROSCOPY: SHX5533

## 2013-05-04 HISTORY — DX: Anemia, unspecified: D64.9

## 2013-05-04 HISTORY — PX: HOT HEMOSTASIS: SHX5433

## 2013-05-04 SURGERY — EGD, WITH ARGON PLASMA COAGULATION
Anesthesia: Monitor Anesthesia Care

## 2013-05-04 SURGERY — ENTEROSCOPY
Anesthesia: Moderate Sedation

## 2013-05-04 MED ORDER — PROMETHAZINE HCL 25 MG/ML IJ SOLN: 6.2500 mg | INTRAMUSCULAR | Status: DC | PRN

## 2013-05-04 MED ORDER — ALBUTEROL SULFATE (5 MG/ML) 0.5% IN NEBU
INHALATION_SOLUTION | RESPIRATORY_TRACT | Status: AC
  Filled 2013-05-04: qty 0.5

## 2013-05-04 MED ORDER — SODIUM CHLORIDE 0.9 % IV SOLN: INTRAVENOUS | Status: DC

## 2013-05-04 MED ORDER — ALBUTEROL SULFATE (5 MG/ML) 0.5% IN NEBU
2.5000 mg | INHALATION_SOLUTION | Freq: Once | RESPIRATORY_TRACT | Status: AC
  Administered 2013-05-04: 2.5 mg via RESPIRATORY_TRACT

## 2013-05-04 MED ORDER — BUTAMBEN-TETRACAINE-BENZOCAINE 2-2-14 % EX AERO
INHALATION_SPRAY | CUTANEOUS | Status: DC | PRN
  Administered 2013-05-04: 1 via TOPICAL

## 2013-05-04 MED ORDER — LACTATED RINGERS IV SOLN
INTRAVENOUS | Status: DC
  Administered 2013-05-04: 11:00:00 via INTRAVENOUS

## 2013-05-04 MED ORDER — MEPERIDINE HCL 100 MG/ML IJ SOLN: 6.2500 mg | INTRAMUSCULAR | Status: DC | PRN

## 2013-05-04 MED ORDER — PROPOFOL INFUSION 10 MG/ML OPTIME
INTRAVENOUS | Status: DC | PRN
  Administered 2013-05-04: 100 ug/kg/min via INTRAVENOUS

## 2013-05-04 MED ORDER — FENTANYL CITRATE 0.05 MG/ML IJ SOLN
INTRAMUSCULAR | Status: DC | PRN
  Administered 2013-05-04: 50 ug via INTRAVENOUS

## 2013-05-04 MED ORDER — KETAMINE HCL 10 MG/ML IJ SOLN
INTRAMUSCULAR | Status: DC | PRN
  Administered 2013-05-04: 15 mg via INTRAVENOUS

## 2013-05-04 MED ORDER — LACTATED RINGERS IV SOLN
INTRAVENOUS | Status: DC
  Administered 2013-05-04: 13:00:00 via INTRAVENOUS

## 2013-05-04 NOTE — Transfer of Care (Signed)
Immediate Anesthesia Transfer of Care Note  Patient: Jason Larsen  Procedure(s) Performed: Procedure(s): HOT HEMOSTASIS (ARGON PLASMA COAGULATION/BICAP) (N/A) ENTEROSCOPY (N/A)  Patient Location: PACU  Anesthesia Type: Level of Consciousness:   Airway & Oxygen Therapy:   Post-op Assessment:   Post vital signs:   Complications:

## 2013-05-04 NOTE — H&P (Signed)
              History of Present Illness: The patient has returned for followup of anemia and GI bleeding. Recent upper endoscopy was normal. AVMs in the cecum at colonoscopy were seen and were cauterized. He was placed back on Coumadin. He denies change in stool color or passage of any blood. April 22 hemoglobin was 8.3. Today it is 9.0. In the last 2 years he has been running around 9.0. Recent ultrasound for a low-lying liver did not demonstrate any specific abnormalities. Upper endoscopy was negative for GI bleeding source. Cecal AVMs were cauterized the patient had persistent bleeding. 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 Review of Systems: Pertinent positive and negative review of systems were noted in the above HPI section. All other review of systems were otherwise negative.  Current Medications, Allergies, Past Medical History, Past Surgical History, Family History and Social History were reviewed in [employer] electronic medical record  Vital signs were reviewed in today's medical record.  Physical Exam:  General: Chronically ill appearing, no acute distress          AVM (arteriovenous malformation) of colon with hemorrhage - Louis Meckel, MD at 03/14/2013 3:01 PM    Status: Written Related Problem: AVM (arteriovenous malformation) of colon with hemorrhage         Acute GI bleeding in the setting of supratherapeutic INR likely was related to cecal AVMs. He's had no overt bleeding since that time. Drop in hemoglobin may have been lab error. His hemoglobin currently is at baseline.  Capsule endoscopy demonstrated a duodenal AVM. Plans are for enteroscopy with cauterization of the AVM.

## 2013-05-04 NOTE — Anesthesia Postprocedure Evaluation (Signed)
Anesthesia Post Note  Patient: Jason Larsen  Procedure(s) Performed: Procedure(s) (LRB): HOT HEMOSTASIS (ARGON PLASMA COAGULATION/BICAP) (N/A) ENTEROSCOPY (N/A)  Anesthesia type: MAC  Patient location: PACU  Post pain: Pain level controlled  Post assessment: Post-op Vital signs reviewed  Last Vitals:  Filed Vitals:   05/04/13 1515  BP: 138/72  Temp:   Resp:     Post vital signs: Reviewed  Level of consciousness: sedated  Complications: No apparent anesthesia complications

## 2013-05-04 NOTE — Anesthesia Preprocedure Evaluation (Signed)
Anesthesia Evaluation  Patient identified by MRN, date of birth, ID band Patient awake    Reviewed: Allergy & Precautions, H&P , NPO status , Patient's Chart, lab work & pertinent test results  Airway Mallampati: II TM Distance: >3 FB Neck ROM: Full    Dental no notable dental hx. (+) Upper Dentures   Pulmonary shortness of breath and with exertion, COPDCurrent Smoker,  breath sounds clear to auscultation  Pulmonary exam normal       Cardiovascular hypertension, Pt. on medications + CAD, + CABG and + Peripheral Vascular Disease + dysrhythmias Atrial Fibrillation Rhythm:Regular Rate:Normal     Neuro/Psych negative neurological ROS  negative psych ROS   GI/Hepatic negative GI ROS, Neg liver ROS,   Endo/Other  diabetes  Renal/GU negative Renal ROS  negative genitourinary   Musculoskeletal negative musculoskeletal ROS (+)   Abdominal   Peds negative pediatric ROS (+)  Hematology negative hematology ROS (+) Blood dyscrasia, anemia ,   Anesthesia Other Findings   Reproductive/Obstetrics negative OB ROS                           Anesthesia Physical Anesthesia Plan  ASA: III  Anesthesia Plan: MAC   Post-op Pain Management:    Induction: Intravenous  Airway Management Planned: Simple Face Mask  Additional Equipment:   Intra-op Plan:   Post-operative Plan:   Informed Consent: I have reviewed the patients History and Physical, chart, labs and discussed the procedure including the risks, benefits and alternatives for the proposed anesthesia with the patient or authorized representative who has indicated his/her understanding and acceptance.   Dental advisory given  Plan Discussed with: CRNA  Anesthesia Plan Comments:         Anesthesia Quick Evaluation

## 2013-05-04 NOTE — Discharge Instructions (Signed)
Resume Coumadin in a.m.Esophagogastroduodenoscopy Care After Refer to this sheet in the next few weeks. These instructions provide you with information on caring for yourself after your procedure. Your caregiver may also give you more specific instructions. Your treatment has been planned according to current medical practices, but problems sometimes occur. Call your caregiver if you have any problems or questions after your procedure.  HOME CARE INSTRUCTIONS  Do not eat or drink anything until the numbing medicine (local anesthetic) has worn off and your gag reflex has returned. You will know that the local anesthetic has worn off when you can swallow comfortably.  Do not drive for 12 hours after the procedure or as directed by your caregiver.  Only take medicines as directed by your caregiver. SEEK MEDICAL CARE IF:   You cannot stop coughing.  You are not urinating at all or less than usual. SEEK IMMEDIATE MEDICAL CARE IF:  You have difficulty swallowing.  You cannot eat or drink.  You have worsening throat or chest pain.  You have dizziness, lightheadedness, or you faint.  You have nausea or vomiting.  You have chills.  You have a fever.  You have severe abdominal pain.  You have black, tarry, or bloody stools. Document Released: 10/13/2012 Document Reviewed: 09/29/2012 [facility] Patient Information 2014 [place], [place].

## 2013-05-04 NOTE — Transfer of Care (Signed)
Immediate Anesthesia Transfer of Care Note  Patient: Jason Larsen  Procedure(s) Performed: Procedure(s): HOT HEMOSTASIS (ARGON PLASMA COAGULATION/BICAP) (N/A) ENTEROSCOPY (N/A)  Patient Location: PACU and Endoscopy Unit  Anesthesia Type:MAC  Level of Consciousness: awake, oriented and patient cooperative  Airway & Oxygen Therapy: Patient Spontanous Breathing and Patient connected to nasal cannula oxygen  Post-op Assessment: Report given to PACU RN, Post -op Vital signs reviewed and stable and Patient moving all extremities  Post vital signs: Reviewed and stable  Complications: No apparent anesthesia complications

## 2013-05-04 NOTE — Op Note (Addendum)
[facility] [address] [place], [address]   OPERATIVE PROCEDURE REPORT  PATIENT: Jason Larsen, Jason Larsen  MR#: [id number] BIRTHDATE: [birth year] , 71  yrs. old GENDER: Male ENDOSCOPIST: Louis Meckel, MD REFERRED BY:  Lennette Bihari, M.D.  Storm Frisk, M.D. PROCEDURE DATE: 05/04/2013 PROCEDURE:   Small bowel enteroscopy with ablation therapy ASA CLASS:   Class III INDICATIONS:1.  melenic bleeding. 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  The Pentax VSB-[id number]  endoscope was introduced through the mouth  and advanced to the proximal jejunum jejunum , limited by Without limitations.   The instrument was slowly withdrawn as the mucosa was fully examined.    An a.v.  malformation was found in the bulb and descending duodenum. Laser ablation was performed and the lesion was obliterated utilizing the argon plasma coagulator. the remainder of the exam including the proximal jejunum, distal duodenum, stomach and esophagus were normal. Retroflexed views revealed no abnormalities. The scope was then withdrawn from the patient and the procedure terminated.  COMPLICATIONS: There were no complications. ENDOSCOPIC IMPRESSION: An a.v.  malformation was found in the bulb and descending duodenum; Laser ablation was performed.  RECOMMENDATIONS: resume coumadin in am  REPEAT EXAM: as needed  _______________________________ eSigned:  Louis Meckel, MD 05/04/2013 1:58 PM Revised: 05/04/2013 1:58 PM  CC:

## 2013-05-05 ENCOUNTER — Encounter (HOSPITAL_COMMUNITY): Payer: Self-pay | Admitting: Gastroenterology

## 2013-05-10 ENCOUNTER — Encounter: Payer: Self-pay | Admitting: *Deleted

## 2013-05-16 ENCOUNTER — Ambulatory Visit (INDEPENDENT_AMBULATORY_CARE_PROVIDER_SITE_OTHER): Payer: Medicare Other | Admitting: Cardiovascular Disease

## 2013-05-16 ENCOUNTER — Encounter: Payer: Self-pay | Admitting: Cardiovascular Disease

## 2013-05-16 VITALS — BP 122/70 | HR 66 | Ht 64.0 in | Wt 135.5 lb

## 2013-05-16 DIAGNOSIS — I4891 Unspecified atrial fibrillation: Secondary | ICD-10-CM

## 2013-05-16 DIAGNOSIS — R0989 Other specified symptoms and signs involving the circulatory and respiratory systems: Secondary | ICD-10-CM

## 2013-05-16 DIAGNOSIS — I2510 Atherosclerotic heart disease of native coronary artery without angina pectoris: Secondary | ICD-10-CM

## 2013-05-16 DIAGNOSIS — I10 Essential (primary) hypertension: Secondary | ICD-10-CM

## 2013-05-16 NOTE — Assessment & Plan Note (Addendum)
The patient is currently maintaining a normal sinus rhythm on amiodarone. He did have GI bleed in March of this year in the setting of significantly high INR. He was found to have AV malformations which were cauterized by Dr. [name]. I will check with Dr. [name] to see if it is safe to resume anticoagulation. Given that he is on amiodarone, I recommend at least yearly thyroid and liver panel. He reports having exertional bradycardia recently but he was asymptomatic. This could have been a technical issue related to the oxymetry device he was using to count his heart rate. He did have a Holter monitor and I will request this from Dr. Randa Lynn. He is on amiodarone, metoprolol and diltiazem. The dose of metoprolol was recently decreased. I will consider stopping metoprolol altogether in order to decrease the chance of significant bradycardia.

## 2013-05-16 NOTE — Progress Notes (Signed)
HPI  This is a 72 year old male who is here today to transfer cardiovascular care from Dr. Tresa Endo at [facility]. He has extensive cardiac history. He has known history of coronary artery disease status post four-vessel CABG in 1997 at [facility], persistent atrial fibrillation maintained in sinus rhythm on amiodarone, COPD with tobacco use, hyperlipidemia, hypertension, type 2 diabetes and GI bleed in March of 2014 with supratherapeutic INR (8).  Most recent echocardiogram also in March of 2014 which showed an ejection fraction of 50-55%, moderately dilated left atrium, mild to moderate MR and moderate pulmonary hypertension. He was hospitalized in March of 2014 with GI bleed and hemoglobin of 5.8. His INR was 8 on presentation. EGD showed no obvious source of bleeding. He was subsequently evaluated by Dr. [name]. Colonoscopy showed AVMs in the cecum which were cauterized. He was placed back on warfarin. 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 He was taken off warfarin and and underwent laser ablation on June 25. He denies chest discomfort or worsening dyspnea. He reports no palpitations. He recently noticed episodes of bradycardia while exercising with a heart rate going down to the 30s. This was checked on a pulse ox monitor. He was asymptomatic and did not have dizziness or syncope. A 24-hour Holter monitor was done by Dr. Randa Lynn but the results are not available.  No Known Allergies   Current Outpatient Prescriptions on File Prior to Visit  Medication Sig Dispense Refill  . albuterol (PROVENTIL) (2.5 MG/3ML) 0.083% nebulizer solution Take 2.5 mg by nebulization every 4 (four) hours as needed for shortness of breath.       . allopurinol (ZYLOPRIM) 300 MG tablet Take 150 mg by mouth every morning.       Marland Kitchen amiodarone (PACERONE) 200 MG tablet Takes 1 tablet am and 1/2 tablet pm daily.      Marland Kitchen aspirin EC 81 MG tablet Take 81 mg by mouth at bedtime.      Marland Kitchen diltiazem (DILACOR XR) 120 MG 24 hr capsule Take 1  capsule (120 mg total) by mouth every morning.  90 capsule  3  . ferrous sulfate 325 (65 FE) MG tablet Take 325 mg by mouth daily with breakfast.      . formoterol (FORADIL) 12 MCG capsule for inhaler Place 12 mcg into inhaler and inhale 2 (two) times daily as needed for wheezing.       . furosemide (LASIX) 20 MG tablet Take 20 mg by mouth 2 (two) times daily.      Marland Kitchen losartan (COZAAR) 50 MG tablet Take 25 mg by mouth every morning.       . metoprolol tartrate (LOPRESSOR) 25 MG tablet Take 12.5 mg by mouth every morning.       Marland Kitchen omeprazole (PRILOSEC) 20 MG capsule Take 20 mg by mouth every morning.       . potassium chloride SA (K-DUR,KLOR-CON) 20 MEQ tablet Take 20 mEq by mouth every morning.      . simvastatin (ZOCOR) 20 MG tablet Take 10 mg by mouth at bedtime.       Marland Kitchen tiotropium (SPIRIVA) 18 MCG inhalation capsule Place 18 mcg into inhaler and inhale daily after supper.        No current facility-administered medications on file prior to visit.     Past Medical History  Diagnosis Date  . Coronary heart disease   . Hyperlipidemia   . HTN (hypertension)   . COPD (chronic obstructive pulmonary disease)   . PVD (peripheral vascular  disease)   . Atrial fibrillation     converted to NSR 5/09 on pacerone  . Shortness of breath     on exertion  . Diabetes mellitus without complication   . Arthritis     fingers  . Anemia   . DJD (degenerative joint disease) of hip   . GI bleed     01/2013 while on warfarin with a hemoglobin of 5.8 (INR was 8)  . CHF (congestive heart failure)     EF 50-55%     Past Surgical History  Procedure Laterality Date  . Joint replacement      RTH  . Coronary artery bypass graft    . Tonsillectomy      as child  . Colonoscopy N/A 02/23/2013    Procedure: COLONOSCOPY;  Surgeon: Louis Meckel, MD;  Location: [facility];  Service: Endoscopy;  Laterality: N/A;  . Esophagogastroduodenoscopy N/A 02/23/2013    Procedure: ESOPHAGOGASTRODUODENOSCOPY (EGD);   Surgeon: Louis Meckel, MD;  Location: [facility];  Service: Endoscopy;  Laterality: N/A;  . Hot hemostasis N/A 05/04/2013    Procedure: HOT HEMOSTASIS (ARGON PLASMA COAGULATION/BICAP);  Surgeon: Louis Meckel, MD;  Location: [facility];  Service: Endoscopy;  Laterality: N/A;  . Enteroscopy N/A 05/04/2013    Procedure: ENTEROSCOPY;  Surgeon: Louis Meckel, MD;  Location: [facility];  Service: Endoscopy;  Laterality: N/A;     Family History  Problem Relation Age of Onset  . Emphysema Father   . Heart disease Father   . Cancer Mother     unknwn type  . Tuberculosis Paternal Grandfather      History   Social History  . Marital Status: Married    Spouse Name: N/A    Number of Children: 3  . Years of Education: N/A   Occupational History  . retired- worked in a print shop x35 yrs    Social History Main Topics  . Smoking status: Current Some Day Smoker -- 0.10 packs/day for 54 years    Types: Cigarettes  . Smokeless tobacco: Never Used     Comment: Started smoking at age 43.  Currently smoking some days.    . Alcohol Use: No     Comment: Beer -haven't drank since Dec 2013  . Drug Use: No  . Sexually Active: Not on file   Other Topics Concern  . Not on file   Social History Narrative  . No narrative on file     ROS Constitutional: Negative for fever, chills, diaphoresis, activity change, appetite change and fatigue.  HENT: Negative for hearing loss, nosebleeds, congestion, sore throat, facial swelling, drooling, trouble swallowing, neck pain, voice change, sinus pressure and tinnitus.  Eyes: Negative for photophobia, pain, discharge and visual disturbance.  Respiratory: Negative for apnea, cough, chest tightness, shortness of breath and wheezing.  Cardiovascular: Negative for chest pain, palpitations and leg swelling.  Gastrointestinal: Negative for nausea, vomiting, abdominal pain, diarrhea, constipation, blood in stool and abdominal distention.    Genitourinary: Negative for dysuria, urgency, frequency, hematuria and decreased urine volume.  Musculoskeletal: Negative for myalgias, back pain, joint swelling, arthralgias and gait problem.  Skin: Negative for color change, pallor, rash and wound.  Neurological: Negative for dizziness, tremors, seizures, syncope, speech difficulty, weakness, light-headedness, numbness and headaches.  Psychiatric/Behavioral: Negative for suicidal ideas, hallucinations, behavioral problems and agitation. The patient is not nervous/anxious.     PHYSICAL EXAM   BP 122/70  Pulse 66  Ht 5\' 4"  (1.626 m)  Wt 135  lb 8 oz (61.462 kg)  BMI 23.25 kg/m2 Constitutional: He is oriented to person, place, and time. He appears well-developed and well-nourished. No distress.  HENT: No nasal discharge.  Head: Normocephalic and atraumatic.  Eyes: Pupils are equal and round.  Neck: Normal range of motion. Neck supple. No JVD present. No thyromegaly present. There is a faint left carotid bruit. Cardiovascular: Normal rate, regular rhythm, normal heart sounds. Exam reveals no gallop and no friction rub. There is a 2/6 systolic murmur at the left sternal border. Pulmonary/Chest: Effort normal and breath sounds normal. No stridor. No respiratory distress. He has no wheezes. He has no rales. He exhibits no tenderness.  Abdominal: Soft. Bowel sounds are normal. He exhibits no distension. There is no tenderness. There is no rebound and no guarding.  Musculoskeletal: Normal range of motion. He exhibits no edema and no tenderness.  Neurological: He is alert and oriented to person, place, and time. Coordination normal.  Skin: Skin is warm and dry. No rash noted. He is not diaphoretic. No erythema. No pallor.  Psychiatric: He has a normal mood and affect. His behavior is normal. Judgment and thought content normal.       ZOX:WRUEAV sinus rhythm-First degree A-V block  PRi = 282 -Nonspecific ST depression   +   Diffuse  nonspecific T-abnormality  -Nondiagnostic.   ABNORMAL    ASSESSMENT AND PLAN

## 2013-05-16 NOTE — Assessment & Plan Note (Signed)
Blood pressure is reasonably controlled on current medications. 

## 2013-05-16 NOTE — Assessment & Plan Note (Signed)
He has no symptoms of angina at the present time. Continue medical therapy.

## 2013-05-16 NOTE — Assessment & Plan Note (Signed)
He reports no evaluation of this in the past. I will request carotid Doppler.

## 2013-05-16 NOTE — Patient Instructions (Addendum)
Your physician has requested that you have a carotid duplex. This test is an ultrasound of the carotid arteries in your neck. It looks at blood flow through these arteries that supply the brain with blood. Allow one hour for this exam. There are no restrictions or special instructions.  Continue same medications.   Follow up in 3 months.

## 2013-05-17 ENCOUNTER — Telehealth: Payer: Self-pay

## 2013-05-17 NOTE — Telephone Encounter (Signed)
Inform the patient that I spoke with Dr. [name] about resuming anticoagulation. He thinks it is safe. Resume Warfarin at previous dose. We can enrol him in our anticoagulation clinic if he desires or have Dr. Randa Lynn follow that.

## 2013-05-18 ENCOUNTER — Other Ambulatory Visit: Payer: Self-pay

## 2013-05-18 MED ORDER — WARFARIN SODIUM 1 MG PO TABS: 1.0000 mg | ORAL_TABLET | ORAL | Status: DC

## 2013-05-18 NOTE — Telephone Encounter (Signed)
Pt informed Understanding verb Wants to enroll in our coumadin clinic appt made for 7/16 at 0950 Will resume warfarin 1 mg daily as previously taking

## 2013-05-19 ENCOUNTER — Encounter: Payer: Self-pay | Admitting: *Deleted

## 2013-05-19 ENCOUNTER — Telehealth: Payer: Self-pay

## 2013-05-19 NOTE — Telephone Encounter (Signed)
Requested holter monitor results from Dr. Randa Lynn

## 2013-05-25 ENCOUNTER — Ambulatory Visit (INDEPENDENT_AMBULATORY_CARE_PROVIDER_SITE_OTHER): Payer: Medicare Other | Admitting: *Deleted

## 2013-05-25 ENCOUNTER — Encounter: Payer: Self-pay | Admitting: Cardiovascular Disease

## 2013-05-25 DIAGNOSIS — I4891 Unspecified atrial fibrillation: Secondary | ICD-10-CM

## 2013-05-25 DIAGNOSIS — Z7901 Long term (current) use of anticoagulants: Secondary | ICD-10-CM

## 2013-05-25 LAB — POCT INR: INR: 1.2

## 2013-05-25 NOTE — Progress Notes (Signed)
Patient ID: Jason Larsen, male   DOB: Jan 27, 1941, 72 y.o.   MRN: [id number]     HPI: Jason Larsen, is a 72 y.o. male who has known coronary artery disease who underwent CABG revascularization surgery in 1997. 2009 catheterization revealed total occlusion of all native coronary arteries with patent grafts. Of note, prior to that catheterization he was significantly anemic and had a GI bleed for which he did see Dr. [name]. He had a history of atrial fibrillation at that time. He had been maintaining sinus rhythm subsequently but developed recurrent atrial fibrillation in January 2004 leading to [facility] evaluation. He's been followed Dr. Randa Lynn in [place] this to his INR and anticoagulation apparently in March 2014 he again was rehospitalized at [facility] with hemoglobin dropping to 5.6. He was found to have multiple AVMs which were obliterated using argon plasma coagulation. He also had a sessile polyp measuring 2.3 mm in size in the rectum and he underwent polypectomy. He also was found to have mild diverticulosis in the transverse colon and the colonic mucosa was otherwise normal. Apparently he has seen Dr. [name]. Coumadin has been resumed at low dose he denies recent blood per rectum. He denies recent chest pain. He is unaware where of recent rhythm abnormalities. per  Past Medical History  Diagnosis Date  . Coronary heart disease   . Hyperlipidemia   . HTN (hypertension)   . COPD (chronic obstructive pulmonary disease)   . PVD (peripheral vascular disease)   . Atrial fibrillation     converted to NSR 5/09 on pacerone  . Shortness of breath     on exertion  . Diabetes mellitus without complication   . Arthritis     fingers  . Anemia   . DJD (degenerative joint disease) of hip   . GI bleed     01/2013 while on warfarin with a hemoglobin of 5.8 (INR was 8)  . CHF (congestive heart failure)     EF 50-55%    Past Surgical History  Procedure Laterality Date  . Joint  replacement      RTH  . Coronary artery bypass graft    . Tonsillectomy      as child  . Colonoscopy N/A 02/23/2013    Procedure: COLONOSCOPY;  Surgeon: Louis Meckel, MD;  Location: [facility];  Service: Endoscopy;  Laterality: N/A;  . Esophagogastroduodenoscopy N/A 02/23/2013    Procedure: ESOPHAGOGASTRODUODENOSCOPY (EGD);  Surgeon: Louis Meckel, MD;  Location: [facility];  Service: Endoscopy;  Laterality: N/A;  . Hot hemostasis N/A 05/04/2013    Procedure: HOT HEMOSTASIS (ARGON PLASMA COAGULATION/BICAP);  Surgeon: Louis Meckel, MD;  Location: [facility];  Service: Endoscopy;  Laterality: N/A;  . Enteroscopy N/A 05/04/2013    Procedure: ENTEROSCOPY;  Surgeon: Louis Meckel, MD;  Location: [facility];  Service: Endoscopy;  Laterality: N/A;    No Known Allergies  Current Outpatient Prescriptions  Medication Sig Dispense Refill  . albuterol (PROVENTIL) (2.5 MG/3ML) 0.083% nebulizer solution Take 2.5 mg by nebulization every 4 (four) hours as needed for shortness of breath.       . allopurinol (ZYLOPRIM) 300 MG tablet Take 150 mg by mouth every morning.       . ferrous sulfate 325 (65 FE) MG tablet Take 325 mg by mouth daily with breakfast.      . formoterol (FORADIL) 12 MCG capsule for inhaler Place 12 mcg into inhaler and inhale 2 (two) times daily as needed for wheezing.       Marland Kitchen  losartan (COZAAR) 50 MG tablet Take 25 mg by mouth every morning.       . metoprolol tartrate (LOPRESSOR) 25 MG tablet Take 12.5 mg by mouth every morning.       . simvastatin (ZOCOR) 20 MG tablet Take 10 mg by mouth at bedtime.       Marland Kitchen tiotropium (SPIRIVA) 18 MCG inhalation capsule Place 18 mcg into inhaler and inhale daily after supper.       Marland Kitchen amiodarone (PACERONE) 200 MG tablet Takes 1 tablet am and 1/2 tablet pm daily.      Marland Kitchen aspirin EC 81 MG tablet Take 81 mg by mouth at bedtime.      Marland Kitchen diltiazem (DILACOR XR) 120 MG 24 hr capsule Take 1 capsule (120 mg total) by mouth every morning.  90  capsule  3  . furosemide (LASIX) 20 MG tablet Take 20 mg by mouth 2 (two) times daily.      Marland Kitchen omeprazole (PRILOSEC) 20 MG capsule Take 20 mg by mouth every morning.       . potassium chloride SA (K-DUR,KLOR-CON) 20 MEQ tablet Take 20 mEq by mouth every morning.      . warfarin (COUMADIN) 1 MG tablet Take 1 tablet (1 mg total) by mouth as directed.  90 tablet  3   No current facility-administered medications for this visit.    Socially he is married has 3 children and 3 grandchildren. There is a remote tobacco history. He does have COPD. He denies EtOH use.  ROS is negative for fevers, chills or night sweats. He denies presyncope or syncope. He denies recurrent chest pain. He does have COPD. Time he does note shortness of breath and some mild wheezing. Denies abdominal discomfort. He denies recurrent GI bleeding recently. He denies significant edema. He denies paresthesias.  Other system review is negative.  PE BP 92/60  Pulse 57  Ht 5\' 4"  (1.626 m)  Wt 137 lb 4.8 oz (62.279 kg)  BMI 23.56 kg/m2  General: Alert, oriented, no distress.  Skin: normal turgor, no rashes HEENT: Normocephalic, atraumatic. Pupils round and reactive; sclera anicteric;no lid lag.  Nose without nasal septal hypertrophy Mouth/Parynx benign; Mallinpatti scale  2/3 Neck: No JVD, no carotid briuts Lungs: Decreased breath sounds diffusely; no wheezing or rales Heart: RRR, s1 s2 normal 1/6 systolic murmur at the Abdomen: soft, nontender; no hepatosplenomehaly, BS+; abdominal aorta nontender and not dilated by palpation. Pulses 2+ Extremities: no clubbing cyanosis or edema, Homan's sign negative  Neurologic: grossly nonfocal  ECG: Normal sinus rhythm with first-degree AV block with a PR interval of 302 ms.  LABS:  BMET    Component Value Date/Time   NA 141 04/11/2013 1621   K 4.3 04/11/2013 1621   CL 100 04/11/2013 1621   CO2 29 04/11/2013 1621   GLUCOSE 187* 04/11/2013 1621   BUN 15 04/11/2013 1621   CREATININE 1.0  04/11/2013 1621   CALCIUM 9.0 04/11/2013 1621   GFRNONAA >60 02/17/2008 1445   GFRAA  Value: >60        The eGFR has been calculated using the MDRD equation. This calculation has not been validated in all clinical 02/17/2008 1445     Hepatic Function Panel     Component Value Date/Time   PROT 6.1 02/09/2013 1159   ALBUMIN 3.4* 02/09/2013 1159   AST 26 02/09/2013 1159   ALT 34 02/09/2013 1159   ALKPHOS 57 02/09/2013 1159   BILITOT 0.6 02/09/2013 1159   BILIDIR 0.1 02/09/2013  1159     CBC    Component Value Date/Time   WBC 6.4 03/27/2013 2027   RBC 4.23 03/27/2013 2027   HGB 10.2* 03/27/2013 2027   HCT 35.1* 03/27/2013 2027   PLT 132* 03/27/2013 2027   MCV 83.0 03/27/2013 2027   MCH 24.1* 03/27/2013 2027   MCHC 29.1* 03/27/2013 2027   RDW 21.5* 03/27/2013 2027   LYMPHSABS 0.8 03/27/2013 2027   MONOABS 0.6 03/27/2013 2027   EOSABS 0.1 03/27/2013 2027   BASOSABS 0.0 03/27/2013 2027     BNP    Component Value Date/Time   PROBNP 137.0* 02/17/2008 1445    Lipid Panel  No results found for this basename: chol, trig, hdl, cholhdl, vldl, ldlcalc     RADIOLOGY: No results found.    ASSESSMENT AND PLAN: Mr. Hulan Fray is now in sinus rhythm. He has known CAD and underwent CABG surgery 1997. He has documented occlusion of all his native coronary arteries and has patent grafts with LIMA to the LAD, sequential graft to the OM1 and OM 2, vein graft to the PDA. He also has documented distal collateralization to the distal AV groove circumflex and distal RCA graft. He is now back in sinus rhythm on low dose amiodarone. He also has a documented mild infrarenal aortic aneurysm and moderate 60% left renal stenosis. He is now back on anticoagulation therapy and is tolerating this without recurrent GI bleed. He has seen Dr. [name] for his GI assessment. As long as he remains stable, I will see him in 2 months for followup cardiology evaluation.       Lennette Bihari, MD, [facility]  05/25/2013 11:10 PM

## 2013-05-25 NOTE — Patient Instructions (Signed)
A full discussion of the nature of anticoagulants has been carried out.  A benefit risk analysis has been presented to the patient, so that they understand the justification for choosing anticoagulation at this time. The need for frequent and regular monitoring, precise dosage adjustment and compliance is stressed.  Side effects of potential bleeding are discussed.  The patient should avoid any over the counter medications containing aspirin or ibuprofen. We suggested Tylenol for minor aches and pains.  Keep leafy green vegetables steady.  Avoid alcohol consumption.  Call if any signs of abnormal bleeding.  Call with any changes in medications. Call if any doctor wants you to stop coumadin. # [phone].

## 2013-06-01 ENCOUNTER — Encounter: Payer: Self-pay | Admitting: Cardiovascular Disease

## 2013-06-01 ENCOUNTER — Ambulatory Visit (INDEPENDENT_AMBULATORY_CARE_PROVIDER_SITE_OTHER): Payer: Medicare Other

## 2013-06-01 DIAGNOSIS — I4891 Unspecified atrial fibrillation: Secondary | ICD-10-CM

## 2013-06-01 DIAGNOSIS — Z7901 Long term (current) use of anticoagulants: Secondary | ICD-10-CM

## 2013-06-01 LAB — POCT INR: INR: 1.9

## 2013-06-08 ENCOUNTER — Ambulatory Visit (INDEPENDENT_AMBULATORY_CARE_PROVIDER_SITE_OTHER): Payer: Medicare Other | Admitting: *Deleted

## 2013-06-08 ENCOUNTER — Encounter (INDEPENDENT_AMBULATORY_CARE_PROVIDER_SITE_OTHER): Payer: Medicare Other

## 2013-06-08 DIAGNOSIS — R0989 Other specified symptoms and signs involving the circulatory and respiratory systems: Secondary | ICD-10-CM

## 2013-06-08 DIAGNOSIS — I4891 Unspecified atrial fibrillation: Secondary | ICD-10-CM

## 2013-06-08 DIAGNOSIS — Z7901 Long term (current) use of anticoagulants: Secondary | ICD-10-CM

## 2013-06-08 DIAGNOSIS — I6529 Occlusion and stenosis of unspecified carotid artery: Secondary | ICD-10-CM

## 2013-06-08 LAB — POCT INR: INR: 2.5

## 2013-06-09 ENCOUNTER — Other Ambulatory Visit: Payer: Self-pay

## 2013-06-09 DIAGNOSIS — I6523 Occlusion and stenosis of bilateral carotid arteries: Secondary | ICD-10-CM

## 2013-06-10 ENCOUNTER — Encounter (HOSPITAL_COMMUNITY): Payer: Self-pay

## 2013-06-10 ENCOUNTER — Encounter: Payer: Self-pay | Admitting: Vascular Surgery

## 2013-06-10 ENCOUNTER — Encounter (HOSPITAL_COMMUNITY)
Admission: RE | Admit: 2013-06-10 | Discharge: 2013-06-10 | Disposition: A | Discharge status: Home / Self Care | Payer: Medicare Other | Source: Ambulatory Visit | Attending: Vascular Surgery | Admitting: Vascular Surgery

## 2013-06-10 ENCOUNTER — Other Ambulatory Visit (INDEPENDENT_AMBULATORY_CARE_PROVIDER_SITE_OTHER): Payer: Medicare Other | Admitting: *Deleted

## 2013-06-10 ENCOUNTER — Other Ambulatory Visit: Payer: Self-pay

## 2013-06-10 ENCOUNTER — Other Ambulatory Visit: Payer: Self-pay | Admitting: *Deleted

## 2013-06-10 ENCOUNTER — Ambulatory Visit (INDEPENDENT_AMBULATORY_CARE_PROVIDER_SITE_OTHER): Payer: Medicare Other | Admitting: Vascular Surgery

## 2013-06-10 ENCOUNTER — Telehealth: Payer: Self-pay

## 2013-06-10 DIAGNOSIS — I6529 Occlusion and stenosis of unspecified carotid artery: Secondary | ICD-10-CM

## 2013-06-10 DIAGNOSIS — I509 Heart failure, unspecified: Secondary | ICD-10-CM | POA: Insufficient documentation

## 2013-06-10 DIAGNOSIS — E119 Type 2 diabetes mellitus without complications: Secondary | ICD-10-CM | POA: Insufficient documentation

## 2013-06-10 DIAGNOSIS — J449 Chronic obstructive pulmonary disease, unspecified: Secondary | ICD-10-CM | POA: Insufficient documentation

## 2013-06-10 DIAGNOSIS — J4489 Other specified chronic obstructive pulmonary disease: Secondary | ICD-10-CM | POA: Insufficient documentation

## 2013-06-10 DIAGNOSIS — E785 Hyperlipidemia, unspecified: Secondary | ICD-10-CM | POA: Insufficient documentation

## 2013-06-10 DIAGNOSIS — I2510 Atherosclerotic heart disease of native coronary artery without angina pectoris: Secondary | ICD-10-CM | POA: Insufficient documentation

## 2013-06-10 DIAGNOSIS — Z01810 Encounter for preprocedural cardiovascular examination: Secondary | ICD-10-CM

## 2013-06-10 DIAGNOSIS — Z01818 Encounter for other preprocedural examination: Secondary | ICD-10-CM | POA: Insufficient documentation

## 2013-06-10 DIAGNOSIS — I10 Essential (primary) hypertension: Secondary | ICD-10-CM | POA: Insufficient documentation

## 2013-06-10 DIAGNOSIS — R42 Dizziness and giddiness: Secondary | ICD-10-CM | POA: Insufficient documentation

## 2013-06-10 DIAGNOSIS — Z01812 Encounter for preprocedural laboratory examination: Secondary | ICD-10-CM | POA: Insufficient documentation

## 2013-06-10 HISTORY — DX: Unspecified cataract: H26.9

## 2013-06-10 HISTORY — DX: Other complications of anesthesia, initial encounter: T88.59XA

## 2013-06-10 HISTORY — DX: Other specified postprocedural states: R11.2

## 2013-06-10 HISTORY — DX: Pneumonia, unspecified organism: J18.9

## 2013-06-10 HISTORY — DX: Adverse effect of unspecified anesthetic, initial encounter: T41.45XA

## 2013-06-10 HISTORY — DX: Cardiac murmur, unspecified: R01.1

## 2013-06-10 HISTORY — DX: Other specified postprocedural states: Z98.890

## 2013-06-10 LAB — URINALYSIS, ROUTINE W REFLEX MICROSCOPIC
Ketones, ur: NEGATIVE mg/dL
Leukocytes, UA: NEGATIVE
Nitrite: NEGATIVE
Protein, ur: NEGATIVE mg/dL
Urobilinogen, UA: 0.2 mg/dL (ref 0.0–1.0)

## 2013-06-10 LAB — CBC
MCH: 30.5 pg (ref 26.0–34.0)
MCV: 89.3 fL (ref 78.0–100.0)
Platelets: 189 10*3/uL (ref 150–400)
RBC: 4.39 MIL/uL (ref 4.22–5.81)
RDW: 18 % — ABNORMAL HIGH (ref 11.5–15.5)
WBC: 6.3 10*3/uL (ref 4.0–10.5)

## 2013-06-10 LAB — PROTIME-INR: INR: 2.45 — ABNORMAL HIGH (ref 0.00–1.49)

## 2013-06-10 LAB — COMPREHENSIVE METABOLIC PANEL
ALT: 86 U/L — ABNORMAL HIGH (ref 0–53)
AST: 64 U/L — ABNORMAL HIGH (ref 0–37)
Albumin: 3.7 g/dL (ref 3.5–5.2)
CO2: 29 mEq/L (ref 19–32)
Calcium: 9.7 mg/dL (ref 8.4–10.5)
Chloride: 105 mEq/L (ref 96–112)
Creatinine, Ser: 0.86 mg/dL (ref 0.50–1.35)
GFR calc non Af Amer: 85 mL/min — ABNORMAL LOW (ref >90)
Sodium: 141 mEq/L (ref 135–145)

## 2013-06-10 LAB — TYPE AND SCREEN: ABO/RH(D): A POS

## 2013-06-10 LAB — APTT: aPTT: 41 seconds — ABNORMAL HIGH (ref 24–37)

## 2013-06-10 LAB — URINE MICROSCOPIC-ADD ON

## 2013-06-10 NOTE — Telephone Encounter (Signed)
Called spoke with Annabelle Harman from [facility] aware pt is at moderate risk from a cardiac standpoint.  OK to hold Coumadin 5 days prior to surgery.

## 2013-06-10 NOTE — Pre-Procedure Instructions (Addendum)
Jason Larsen  06/10/2013   Your procedure is scheduled on: Wednesday, June 15, 2013  Report to [facility] at 6:30 AM.  Call this number if you have problems the morning of surgery: [phone]   Remember:   Do not eat food or drink liquids after midnight.   Take these medicines the morning of surgery with A SIP OF WATER:allopurinol (ZYLOPRIM) 300 MG tablet, amiodarone (PACERONE) 200 MG tablet, diltiazem (DILACOR XR) 120 MG 24 hr capsule, doxycycline (VIBRA-TABS) 100 MG tablet, metoprolol tartrate (LOPRESSOR) 12.5 MG, moxifloxacin (AVELOX) 400 MG tablet, omeprazole (PRILOSEC) 20 MG capsule, if needed:formoterol (FORADIL) 12 MCG capsule for inhaler for wheezing ( bring inhaler on day of surgery) Stop taking Coumadin, and herbal medications. Do not take any NSAIDs ie: Ibuprofen, Advil, Naproxen or any medication containing Aspirin.  Do not wear jewelry, make-up or nail polish.  Do not wear lotions, powders, or perfumes. You may wear deodorant.  Do not shave 48 hours prior to surgery. Men may shave face and neck.  Do not bring valuables to the hospital.  [facility] is not responsible  for any belongings or valuables.  Contacts, dentures or bridgework may not be worn into surgery.  Leave suitcase in the car. After surgery it may be brought to your room.  For patients admitted to the hospital, checkout time is 11:00 AM the day of discharge.   Patients discharged the day of surgery will not be allowed to drive home.  Name and phone number of your driver:   Special Instructions: Shower using CHG 2 nights before surgery and the night before surgery.  If you shower the day of surgery use CHG.  Use special wash - you have one bottle of CHG for all showers.  You should use approximately 1/3 of the bottle for each shower.   Please read over the following fact sheets that you were given: Pain Booklet, Coughing and Deep Breathing, Blood Transfusion Information, MRSA Information and Surgical  Site Infection Prevention

## 2013-06-10 NOTE — Progress Notes (Signed)
[facility] [place]  New Carotid Patient  Referred by:  Andrew S Lamb, MD [address]   [place], [place] [address]  Reason for referral: L carotid stenosis  History of Present Illness  Jason Larsen is a 72 y.o. (11/15/1940) male who presents with chief complaint: dizziness.  Previous carotid studies demonstrated: RICA 60-79% stenosis, LICA 80-99% stenosis.  Patient has no history of TIA or stroke symptom.  The patient has never had amaurosis fugax or monocular blindness.  The patient has never had facial drooping or hemiplegia.  The patient has never had receptive or expressive aphasia.   The patient's risks factors for carotid disease include: htn, hyperlipidemia, AFib, DM, and active smoking.  Past Medical History  Diagnosis Date  . Coronary heart disease   . Hyperlipidemia   . HTN (hypertension)   . COPD (chronic obstructive pulmonary disease)   . PVD (peripheral vascular disease)   . Atrial fibrillation     converted to NSR 5/09 on pacerone  . Shortness of breath     on exertion  . Diabetes mellitus without complication   . Arthritis     fingers  . Anemia   . DJD (degenerative joint disease) of hip   . GI bleed     01/2013 while on warfarin with a hemoglobin of 5.8 (INR was 8)  . CHF (congestive heart failure)     EF 50-55%  . Carotid artery occlusion     Past Surgical History  Procedure Laterality Date  . Tonsillectomy      as child  . Colonoscopy N/A 02/23/2013    Procedure: COLONOSCOPY;  Surgeon: Robert D Kaplan, MD;  Location: [facility];  Service: Endoscopy;  Laterality: N/A;  . Esophagogastroduodenoscopy N/A 02/23/2013    Procedure: ESOPHAGOGASTRODUODENOSCOPY (EGD);  Surgeon: Robert D Kaplan, MD;  Location: [facility];  Service: Endoscopy;  Laterality: N/A;  . Hot hemostasis N/A 05/04/2013    Procedure: HOT HEMOSTASIS (ARGON PLASMA COAGULATION/BICAP);  Surgeon: Robert D Kaplan, MD;  Location: [facility];  Service: Endoscopy;   Laterality: N/A;  . Enteroscopy N/A 05/04/2013    Procedure: ENTEROSCOPY;  Surgeon: Robert D Kaplan, MD;  Location: [facility];  Service: Endoscopy;  Laterality: N/A;  . Bypass graft  1997 ?  . Coronary artery bypass graft      15-17 yrs  . Joint replacement Right      Hip 10-12 yrs    History   Social History  . Marital Status: Married    Spouse Name: N/A    Number of Children: 3  . Years of Education: N/A   Occupational History  . retired- worked in a print shop x35 yrs    Social History Main Topics  . Smoking status: Current Some Day Smoker -- 0.10 packs/day for 54 years    Types: Cigarettes  . Smokeless tobacco: Never Used     Comment: Started smoking at age 19.  Currently smoking some days.    . Alcohol Use: No     Comment: Beer -haven't drank since Dec 2013  . Drug Use: No  . Sexually Active: Not on file   Other Topics Concern  . Not on file   Social History Narrative  . No narrative on file    Family History  Problem Relation Age of Onset  . Emphysema Father   . Heart disease Father   . Cancer Mother     unknwn type  . Tuberculosis Paternal Grandfather   .   Peripheral vascular disease Sister   . Hyperlipidemia Brother     Current Outpatient Prescriptions on File Prior to Visit  Medication Sig Dispense Refill  . albuterol (PROVENTIL) (2.5 MG/3ML) 0.083% nebulizer solution Take 2.5 mg by nebulization every 4 (four) hours as needed for shortness of breath.       . allopurinol (ZYLOPRIM) 300 MG tablet Take 150 mg by mouth every morning.       . amiodarone (PACERONE) 200 MG tablet Takes 1 tablet am and 1/2 tablet pm daily.      . aspirin EC 81 MG tablet Take 81 mg by mouth at bedtime.      . diltiazem (DILACOR XR) 120 MG 24 hr capsule Take 1 capsule (120 mg total) by mouth every morning.  90 capsule  3  . ferrous sulfate 325 (65 FE) MG tablet Take 325 mg by mouth daily with breakfast.      . formoterol (FORADIL) 12 MCG capsule for inhaler Place 12 mcg into  inhaler and inhale 2 (two) times daily as needed for wheezing.       . furosemide (LASIX) 20 MG tablet Take 20 mg by mouth 2 (two) times daily.      . losartan (COZAAR) 50 MG tablet Take 25 mg by mouth every morning.       . metoprolol tartrate (LOPRESSOR) 25 MG tablet Take 12.5 mg by mouth every morning.       . omeprazole (PRILOSEC) 20 MG capsule Take 20 mg by mouth every morning.       . potassium chloride SA (K-DUR,KLOR-CON) 20 MEQ tablet Take 20 mEq by mouth every morning.      . simvastatin (ZOCOR) 20 MG tablet Take 10 mg by mouth at bedtime.       . tiotropium (SPIRIVA) 18 MCG inhalation capsule Place 18 mcg into inhaler and inhale daily after supper.       . warfarin (COUMADIN) 1 MG tablet Take 1 tablet (1 mg total) by mouth as directed.  90 tablet  3   No current facility-administered medications on file prior to visit.    No Known Allergies  REVIEW OF SYSTEMS:  (Positives checked otherwise negative)  CARDIOVASCULAR:  [] chest pain, [] chest pressure, [] palpitations, [] shortness of breath when laying flat, [x] shortness of breath with exertion,  [] pain in feet when walking, [] pain in feet when laying flat, [] history of blood clot in veins (DVT), [] history of phlebitis, [x] swelling in legs, [] varicose veins  PULMONARY:  [x] productive cough, [] asthma, [x] wheezing  NEUROLOGIC:  [] weakness in arms or legs, [] numbness in arms or legs, [] difficulty speaking or slurred speech, [] temporary loss of vision in one eye, [] dizziness  HEMATOLOGIC:  [x] bleeding problems, [] problems with blood clotting too easily  MUSCULOSKEL:  [] joint pain, [] joint swelling  GASTROINTEST:  [] vomiting blood, [x] blood in stool     GENITOURINARY:  [x] burning with urination, [x] blood in urine  PSYCHIATRIC:  [] history of major depression  INTEGUMENTARY:  [] rashes, [] ulcers  CONSTITUTIONAL:  [] fever, [] chills  For VQI Use Only  PRE-ADM LIVING: Home  AMB STATUS:  Ambulatory  CAD Sx: None  PRIOR CHF: Asymptomatic, History of CHF  STRESS TEST: [x] No, [ ] Normal, [ ] + ischemia, [ ] + MI, [ ] Both  Physical Examination  Filed Vitals:   06/10/13 1055  BP: 121/83    Pulse: 66  Temp: 97.7 F (36.5 C)  TempSrc: Oral  Resp: 16  Height: 5' 4" (1.626 m)  Weight: 135 lb (61.236 kg)  SpO2: 100%   Body mass index is 23.16 kg/(m^2).  General: A&O x 3, WDWN  Head: [place]/AT  Ear/Nose/Throat: Hearing grossly intact, nares w/o erythema or drainage, oropharynx w/o Erythema/Exudate, Mallampati score: 3, bullous nares  Eyes: PERRLA, EOMI  Neck: Supple, no nuchal rigidity, no palpable LAD  Pulmonary: Sym exp, good air movt, CTAB, no rales, rhonchi, & wheezing  Cardiac: RRR, Nl S1, S2, no Murmurs, rubs or gallops  Vascular: Vessel Right Left  Radial Faintly Palpable Faintly Palpable  Brachial Palpable Palpable  Carotid Palpable, without bruit Palpable, without bruit  Aorta Not palpable N/A  Femoral Palpable Palpable  Popliteal Not palpable Not palpable  PT Palpable Not Palpable  DP Palpable Not Palpable   Gastrointestinal: soft, NTND, -G/R, - HSM, - masses, - CVAT B  Musculoskeletal: M/S 5/5 throughout , Extremities without ischemic changes   Neurologic: CN 2-12 intact , Pain and light touch intact in extremities , Motor exam as listed above  Psychiatric: Judgment intact, Mood & affect appropriate for pt's clinical situation  Dermatologic: See M/S exam for extremity exam, no rashes otherwise noted  Lymph : No Cervical, Axillary, or Inguinal lymphadenopathy   Non-Invasive Vascular Imaging  CAROTID DUPLEX (Date: 06/10/2013):   R ICA stenosis: 60-79%  R VA: patent and antegrade  L ICA stenosis: 80-99%  L VA: patent and antegrade  I ordered and interpreted this study.    Outside Studies/Documentation 1 pages of outside documents were reviewed including: outside duplex.  Medical Decision Making  Mathew D Maack is a 71 y.o. male  who presents with: known CAD, COPD requiring home oxygen, recent GI bleed, recently convert afib, active smoking, asx ICA stenosis 80-99%.   Based on the patient's vascular studies and examination, I have offered the patient: L CEA. I discussed with the patient the risks, benefits, and alternatives to carotid endarterectomy.   I discussed the procedural details of carotid endarterectomy with the patient.  The patient is aware that the risks of carotid endarterectomy include but are not limited to: bleeding, infection, stroke, myocardial infarction, death, cranial nerve injuries both temporary and permanent, neck hematoma, possible airway compromise, labile blood pressure post-operatively, cerebral hyperperfusion syndrome, and possible need for additional interventions in the future.  The patient is aware of the risks and agrees to proceed forward with the procedure. The patient has significant cardiopulmonary disease.  I will obtain Dr. Arida's input in regards need for any additional preoperative cardiac testing.    The patient will need to continue his Amiodarone for afib control and hold Coumadin for the procedure.  I discussed in depth with the patient the nature of atherosclerosis, and emphasized the importance of maximal medical management including strict control of blood pressure, blood glucose, and lipid levels, obtaining regular exercise, antiplatelet agents, and cessation of smoking.   The patient is currently on a statin: Zocor.  The patient is currently on an anti-platelet: ASA.    The patient is aware that without maximal medical management the underlying atherosclerotic disease process will progress, limiting the benefit of any interventions.  Thank you for allowing us to participate in this patient's care.  Khalila Buechner, MD [facility]  Office: [phone] Pager: [phone]  06/10/2013, 11:42 AM        

## 2013-06-10 NOTE — Telephone Encounter (Signed)
Okey Regal a nurse from [facility] called and  Mr. [place] needs to stop his coumiden for 5 days prior to having surgery. Patient having surgery 06/15/13 for left CEA.

## 2013-06-10 NOTE — Telephone Encounter (Signed)
Dr Leonides Sake wants to perform CEA, and needs cardiac clearance

## 2013-06-10 NOTE — Progress Notes (Signed)
Pt chart forwarded to [place], [place] ( anesthesia ) for review of EKG, chest x ray, echo, labs (PT, PTT) and pending cardiac clearance.

## 2013-06-10 NOTE — Progress Notes (Signed)
Spoke with Okey Regal, RN from Dr. Nicky Pugh office, okay for pt to continue taking Aspirin 81mg  EC.

## 2013-06-10 NOTE — Progress Notes (Signed)
Pt states that he has periods of SOB but denies chest pain. Pt is currently under the care of Dr. Kirke Corin, cardiologist at [facility] in [place], [place]. Requests were made for results of EKG's, stress test, and cardiac catheterization from new and previous cardiologist, Dr. Tresa Endo of [facility]. Pt made aware to stop coumadin but continue with aspirin (per Okey Regal, RN).

## 2013-06-10 NOTE — Telephone Encounter (Signed)
He is at moderate risk from a cardiac standpoint. No further cardiac testing is indicated. Warfarin can be held 5 days before surgery.

## 2013-06-10 NOTE — Telephone Encounter (Signed)
Pt had OV with Dr Kirke Corin 05/16/13.  Carotid dopplers done 06/08/13.  Please advise if ok to provide cardiac clearance for surgery.

## 2013-06-13 NOTE — Progress Notes (Signed)
Anesthesia chart review: Patient is a 72 year old male scheduled for left carotid endarterectomy by Dr. Imogene Burn on 06/15/2013.  Carotid duplex on 06/10/13 showed 60-79% RICAS and 80-99% LICAS.  Other history includes smoking, CAD s/p CABG '97, a-fib, CHF, PVD, DM2, hypertension, hyperlipidemia, COPD, community-acquired pneumonia with associated right pleural effusion s/p thoracentesis 03/2013, anemia with GI bleed likely related to cecal/duodenal AV  S/p cauterization '14, cataracts, postoperative nausea/vomiting. PCP is listed as Dr. Alonna Buckler. Pulmonologist is Dr. Shan Levans.  Cardiologist is now Dr. Lorine Bears who was notified of planned procedure and wrote, "He is at moderate risk from a cardiac standpoint. No further cardiac testing is indicated. Warfarin can be held 5 days before surgery." (He was previously followed by Dr. Tresa Endo at [facility].)  Echo on 01/26/13 [facility]) showed LVEF by visual estimation 50-55%. Moderately dilated left atrium. Mild to moderate mitral valve regurgitation. Moderately elevated pulmonary artery systolic pressure. Mild to moderate tricuspid regurgitation.  Nuclear stress test on 02/25/12 [facility]) showed a mild perfusion defect is seen in the mid inferior and apical inferior regions consistent with infarct/scar and overlying attenuation. Resting and post stress LV is normal in size. There is no scintigraphic evidence of inducible ischemia. Dr. wall motion abnormality consistent with conduction delay. Low risk scan.  Cardiac cath on 03/28/11 showed: 1. Moderate left ventricular dysfunction with ejection fraction of approximately 45% with moderate hypocontractility involving the inferoapical to low posterolateral wall.  2. There is 3+ angiographic mitral regurgitation.  3. Moderate pulmonary hypertension with pulmonary artery systolic pressure at 15 mm.  4. Severe native coronary obstructive disease with subtotal occlusion of the left main, total proximal LAD occlusion; total  proximal circumflex occlusion; diffusely diseased native right coronary artery with 95% proximal stenosis followed by diffuse 99% stenosis. Patent LIMA to the LAD. Patent sequential vein graft to the OM1 and OM2. Patent vein graft to the PDA with evidence for collateralization of the distal AV groove circumflex via the distal right coronary artery via this vein graft. 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 Medical therapy was recommended.  EKG on 05/16/13 showed NSR, first degree AVB, non-specific ST depression and T wave abnormality. He had predominant SR on Holter monitor 04/2013.  His last CXR was on 03/29/13.  He has since undergone right thoracentesis and additional antibiotic therapy for community-acquired pneumonia. Will plan to repeat a chest x-ray on the day of surgery.  Preoperative labs noted.  Repeat PT/PTT on the day of surgery.  If same day studies are reasonable and otherwise no acute changes then I would anticipate that he could proceed as planned.  Velna Ochs [facility] Stay [facility]/Anesthesiology Phone [phone] 06/13/2013 9:50 AM

## 2013-06-14 ENCOUNTER — Encounter (HOSPITAL_COMMUNITY): Payer: Self-pay | Admitting: Pharmacy Technician

## 2013-06-14 MED ORDER — DEXTROSE 5 % IV SOLN
1.5000 g | INTRAVENOUS | Status: AC
  Administered 2013-06-15: 1.5 g via INTRAVENOUS
  Filled 2013-06-14: qty 1.5

## 2013-06-15 ENCOUNTER — Encounter (HOSPITAL_COMMUNITY): Payer: Self-pay | Admitting: Vascular Surgery

## 2013-06-15 ENCOUNTER — Inpatient Hospital Stay (HOSPITAL_COMMUNITY): Payer: Medicare Other | Admitting: Anesthesiology

## 2013-06-15 ENCOUNTER — Encounter (HOSPITAL_COMMUNITY): Payer: Self-pay | Admitting: *Deleted

## 2013-06-15 ENCOUNTER — Inpatient Hospital Stay (HOSPITAL_COMMUNITY): Payer: Medicare Other

## 2013-06-15 ENCOUNTER — Encounter (HOSPITAL_COMMUNITY)
Admission: RE | Disposition: A | Discharge status: Home / Self Care | Payer: Self-pay | Source: Ambulatory Visit | Attending: Vascular Surgery

## 2013-06-15 ENCOUNTER — Inpatient Hospital Stay (HOSPITAL_COMMUNITY)
Admission: RE | Admit: 2013-06-15 | Discharge: 2013-06-16 | DRG: 039 | Disposition: A | Discharge status: Home / Self Care | Payer: Medicare Other | Source: Ambulatory Visit | Attending: Vascular Surgery | Admitting: Vascular Surgery

## 2013-06-15 ENCOUNTER — Telehealth: Payer: Self-pay | Admitting: Vascular Surgery

## 2013-06-15 DIAGNOSIS — Z96649 Presence of unspecified artificial hip joint: Secondary | ICD-10-CM

## 2013-06-15 DIAGNOSIS — E119 Type 2 diabetes mellitus without complications: Secondary | ICD-10-CM | POA: Diagnosis present

## 2013-06-15 DIAGNOSIS — I4891 Unspecified atrial fibrillation: Secondary | ICD-10-CM | POA: Diagnosis present

## 2013-06-15 DIAGNOSIS — F17200 Nicotine dependence, unspecified, uncomplicated: Secondary | ICD-10-CM | POA: Diagnosis present

## 2013-06-15 DIAGNOSIS — I739 Peripheral vascular disease, unspecified: Secondary | ICD-10-CM | POA: Diagnosis present

## 2013-06-15 DIAGNOSIS — Z9861 Coronary angioplasty status: Secondary | ICD-10-CM

## 2013-06-15 DIAGNOSIS — I509 Heart failure, unspecified: Secondary | ICD-10-CM | POA: Diagnosis present

## 2013-06-15 DIAGNOSIS — E785 Hyperlipidemia, unspecified: Secondary | ICD-10-CM | POA: Diagnosis present

## 2013-06-15 DIAGNOSIS — M1610 Unilateral primary osteoarthritis, unspecified hip: Secondary | ICD-10-CM | POA: Diagnosis present

## 2013-06-15 DIAGNOSIS — I2510 Atherosclerotic heart disease of native coronary artery without angina pectoris: Secondary | ICD-10-CM | POA: Diagnosis present

## 2013-06-15 DIAGNOSIS — Z9981 Dependence on supplemental oxygen: Secondary | ICD-10-CM

## 2013-06-15 DIAGNOSIS — M169 Osteoarthritis of hip, unspecified: Secondary | ICD-10-CM | POA: Diagnosis present

## 2013-06-15 DIAGNOSIS — D649 Anemia, unspecified: Secondary | ICD-10-CM | POA: Diagnosis present

## 2013-06-15 DIAGNOSIS — J449 Chronic obstructive pulmonary disease, unspecified: Secondary | ICD-10-CM | POA: Diagnosis present

## 2013-06-15 DIAGNOSIS — J4489 Other specified chronic obstructive pulmonary disease: Secondary | ICD-10-CM | POA: Diagnosis present

## 2013-06-15 DIAGNOSIS — Z7901 Long term (current) use of anticoagulants: Secondary | ICD-10-CM

## 2013-06-15 DIAGNOSIS — I6529 Occlusion and stenosis of unspecified carotid artery: Secondary | ICD-10-CM

## 2013-06-15 HISTORY — PX: CAROTID ENDARTERECTOMY: SUR193

## 2013-06-15 HISTORY — PX: PATCH ANGIOPLASTY: SHX6230

## 2013-06-15 HISTORY — PX: ENDARTERECTOMY: SHX5162

## 2013-06-15 LAB — CBC
Hemoglobin: 11.2 g/dL — ABNORMAL LOW (ref 13.0–17.0)
MCH: 30.6 pg (ref 26.0–34.0)
MCHC: 34.4 g/dL (ref 30.0–36.0)
MCV: 89.1 fL (ref 78.0–100.0)

## 2013-06-15 LAB — PROTIME-INR: INR: 1.22 (ref 0.00–1.49)

## 2013-06-15 LAB — CREATININE, SERUM: Creatinine, Ser: 0.7 mg/dL (ref 0.50–1.35)

## 2013-06-15 LAB — GLUCOSE, CAPILLARY: Glucose-Capillary: 102 mg/dL — ABNORMAL HIGH (ref 70–99)

## 2013-06-15 SURGERY — ENDARTERECTOMY, CAROTID
Anesthesia: General | Site: Neck | Laterality: Left | Wound class: Clean

## 2013-06-15 MED ORDER — OXYCODONE HCL 5 MG PO TABS: 5.0000 mg | ORAL_TABLET | ORAL | Status: DC | PRN

## 2013-06-15 MED ORDER — ONDANSETRON HCL 4 MG/2ML IJ SOLN
INTRAMUSCULAR | Status: DC | PRN
  Administered 2013-06-15: 4 mg via INTRAVENOUS

## 2013-06-15 MED ORDER — DOCUSATE SODIUM 100 MG PO CAPS
100.0000 mg | ORAL_CAPSULE | Freq: Every day | ORAL | Status: DC
  Administered 2013-06-16: 100 mg via ORAL
  Filled 2013-06-15: qty 1

## 2013-06-15 MED ORDER — ONDANSETRON HCL 4 MG/2ML IJ SOLN: 4.0000 mg | Freq: Once | INTRAMUSCULAR | Status: AC | PRN

## 2013-06-15 MED ORDER — HYDROMORPHONE HCL PF 1 MG/ML IJ SOLN: 0.2500 mg | INTRAMUSCULAR | Status: DC | PRN

## 2013-06-15 MED ORDER — GLYCOPYRROLATE 0.2 MG/ML IJ SOLN
INTRAMUSCULAR | Status: DC | PRN
  Administered 2013-06-15: .8 mg via INTRAVENOUS

## 2013-06-15 MED ORDER — DEXTROSE 5 % IV SOLN
1.5000 g | Freq: Two times a day (BID) | INTRAVENOUS | Status: AC
  Administered 2013-06-15 – 2013-06-16 (×2): 1.5 g via INTRAVENOUS
  Filled 2013-06-15 (×2): qty 1.5

## 2013-06-15 MED ORDER — LIDOCAINE HCL (CARDIAC) 20 MG/ML IV SOLN
INTRAVENOUS | Status: DC | PRN
  Administered 2013-06-15: 100 mg via INTRAVENOUS

## 2013-06-15 MED ORDER — DEXTRAN 40 IN SALINE 10-0.9 % IV SOLN
INTRAVENOUS | Status: DC | PRN
  Administered 2013-06-15: 500 mL

## 2013-06-15 MED ORDER — MORPHINE SULFATE 2 MG/ML IJ SOLN: 2.0000 mg | INTRAMUSCULAR | Status: DC | PRN

## 2013-06-15 MED ORDER — SODIUM CHLORIDE 0.9 % IV SOLN: 500.0000 mL | Freq: Once | INTRAVENOUS | Status: AC | PRN

## 2013-06-15 MED ORDER — THROMBIN 20000 UNITS EX SOLR
CUTANEOUS | Status: DC | PRN
  Administered 2013-06-15: 11:00:00 via TOPICAL

## 2013-06-15 MED ORDER — ALBUTEROL SULFATE (5 MG/ML) 0.5% IN NEBU: 2.5000 mg | INHALATION_SOLUTION | Freq: Once | RESPIRATORY_TRACT | Status: AC

## 2013-06-15 MED ORDER — ACETAMINOPHEN 325 MG PO TABS: 325.0000 mg | ORAL_TABLET | ORAL | Status: DC | PRN

## 2013-06-15 MED ORDER — SIMVASTATIN 10 MG PO TABS
10.0000 mg | ORAL_TABLET | Freq: Every day | ORAL | Status: DC
  Administered 2013-06-15: 10 mg via ORAL
  Filled 2013-06-15 (×2): qty 1

## 2013-06-15 MED ORDER — LABETALOL HCL 5 MG/ML IV SOLN: 10.0000 mg | INTRAVENOUS | Status: DC | PRN

## 2013-06-15 MED ORDER — 0.9 % SODIUM CHLORIDE (POUR BTL) OPTIME
TOPICAL | Status: DC | PRN
  Administered 2013-06-15: 2000 mL

## 2013-06-15 MED ORDER — HEPARIN SODIUM (PORCINE) 1000 UNIT/ML IJ SOLN
INTRAMUSCULAR | Status: DC | PRN
  Administered 2013-06-15: 2000 [IU] via INTRAVENOUS
  Administered 2013-06-15: 5000 [IU] via INTRAVENOUS

## 2013-06-15 MED ORDER — PHENOL 1.4 % MT LIQD: 1.0000 | OROMUCOSAL | Status: DC | PRN

## 2013-06-15 MED ORDER — FERROUS SULFATE 325 (65 FE) MG PO TABS
325.0000 mg | ORAL_TABLET | Freq: Every day | ORAL | Status: DC
  Administered 2013-06-16: 325 mg via ORAL
  Filled 2013-06-15 (×2): qty 1

## 2013-06-15 MED ORDER — NEOSTIGMINE METHYLSULFATE 1 MG/ML IJ SOLN
INTRAMUSCULAR | Status: DC | PRN
  Administered 2013-06-15: 5 mg via INTRAVENOUS

## 2013-06-15 MED ORDER — POTASSIUM CHLORIDE CRYS ER 20 MEQ PO TBCR
20.0000 meq | EXTENDED_RELEASE_TABLET | Freq: Every morning | ORAL | Status: DC
  Administered 2013-06-16: 20 meq via ORAL
  Filled 2013-06-15: qty 1

## 2013-06-15 MED ORDER — FENTANYL CITRATE 0.05 MG/ML IJ SOLN
INTRAMUSCULAR | Status: DC | PRN
  Administered 2013-06-15 (×2): 50 ug via INTRAVENOUS
  Administered 2013-06-15: 100 ug via INTRAVENOUS
  Administered 2013-06-15: 50 ug via INTRAVENOUS

## 2013-06-15 MED ORDER — LIDOCAINE HCL 4 % MT SOLN
OROMUCOSAL | Status: DC | PRN
  Administered 2013-06-15: 4 mL via TOPICAL

## 2013-06-15 MED ORDER — SALMETEROL XINAFOATE 50 MCG/DOSE IN AEPB
1.0000 | INHALATION_SPRAY | Freq: Two times a day (BID) | RESPIRATORY_TRACT | Status: DC
  Administered 2013-06-15 – 2013-06-16 (×2): 1 via RESPIRATORY_TRACT
  Filled 2013-06-15: qty 0

## 2013-06-15 MED ORDER — POTASSIUM CHLORIDE CRYS ER 20 MEQ PO TBCR: 20.0000 meq | EXTENDED_RELEASE_TABLET | Freq: Once | ORAL | Status: AC | PRN

## 2013-06-15 MED ORDER — ALBUTEROL SULFATE (5 MG/ML) 0.5% IN NEBU
INHALATION_SOLUTION | RESPIRATORY_TRACT | Status: AC
  Administered 2013-06-15: 2.5 mg via RESPIRATORY_TRACT
  Filled 2013-06-15: qty 0.5

## 2013-06-15 MED ORDER — ALBUTEROL SULFATE (5 MG/ML) 0.5% IN NEBU: 2.5000 mg | INHALATION_SOLUTION | RESPIRATORY_TRACT | Status: DC | PRN

## 2013-06-15 MED ORDER — ONDANSETRON HCL 4 MG/2ML IJ SOLN: 4.0000 mg | Freq: Four times a day (QID) | INTRAMUSCULAR | Status: DC | PRN

## 2013-06-15 MED ORDER — SODIUM CHLORIDE 0.9 % IR SOLN
Status: DC | PRN
  Administered 2013-06-15: 09:00:00

## 2013-06-15 MED ORDER — ENOXAPARIN SODIUM 30 MG/0.3ML ~~LOC~~ SOLN
30.0000 mg | SUBCUTANEOUS | Status: DC
  Administered 2013-06-16: 30 mg via SUBCUTANEOUS
  Filled 2013-06-15: qty 0.3

## 2013-06-15 MED ORDER — SODIUM CHLORIDE 0.9 % IV SOLN
INTRAVENOUS | Status: DC
  Administered 2013-06-15 – 2013-06-16 (×2): via INTRAVENOUS

## 2013-06-15 MED ORDER — HYDRALAZINE HCL 20 MG/ML IJ SOLN: 10.0000 mg | INTRAMUSCULAR | Status: DC | PRN

## 2013-06-15 MED ORDER — PROTAMINE SULFATE 10 MG/ML IV SOLN
INTRAVENOUS | Status: DC | PRN
  Administered 2013-06-15: 20 mg via INTRAVENOUS
  Administered 2013-06-15: 10 mg via INTRAVENOUS

## 2013-06-15 MED ORDER — ALLOPURINOL 150 MG HALF TABLET
150.0000 mg | ORAL_TABLET | Freq: Every morning | ORAL | Status: DC
  Administered 2013-06-16: 150 mg via ORAL
  Filled 2013-06-15: qty 1

## 2013-06-15 MED ORDER — LACTATED RINGERS IV SOLN
INTRAVENOUS | Status: DC | PRN
  Administered 2013-06-15 (×3): via INTRAVENOUS

## 2013-06-15 MED ORDER — LIDOCAINE HCL (PF) 1 % IJ SOLN
INTRAMUSCULAR | Status: AC
  Filled 2013-06-15: qty 30

## 2013-06-15 MED ORDER — OXYCODONE HCL 5 MG PO TABS: 5.0000 mg | ORAL_TABLET | Freq: Four times a day (QID) | ORAL | Status: DC | PRN

## 2013-06-15 MED ORDER — GUAIFENESIN-DM 100-10 MG/5ML PO SYRP: 15.0000 mL | ORAL_SOLUTION | ORAL | Status: DC | PRN

## 2013-06-15 MED ORDER — DOPAMINE-DEXTROSE 3.2-5 MG/ML-% IV SOLN: 3.0000 ug/kg/min | INTRAVENOUS | Status: DC

## 2013-06-15 MED ORDER — FUROSEMIDE 20 MG PO TABS
20.0000 mg | ORAL_TABLET | Freq: Every day | ORAL | Status: DC
  Administered 2013-06-16: 20 mg via ORAL
  Filled 2013-06-15: qty 1

## 2013-06-15 MED ORDER — OXYCODONE HCL 5 MG/5ML PO SOLN: 5.0000 mg | Freq: Once | ORAL | Status: AC | PRN

## 2013-06-15 MED ORDER — THROMBIN 20000 UNITS EX SOLR
CUTANEOUS | Status: AC
  Filled 2013-06-15: qty 20000

## 2013-06-15 MED ORDER — ASPIRIN EC 81 MG PO TBEC
81.0000 mg | DELAYED_RELEASE_TABLET | Freq: Every day | ORAL | Status: DC
  Administered 2013-06-15: 81 mg via ORAL
  Filled 2013-06-15 (×2): qty 1

## 2013-06-15 MED ORDER — DILTIAZEM HCL ER 120 MG PO CP24
120.0000 mg | ORAL_CAPSULE | Freq: Every morning | ORAL | Status: DC
  Administered 2013-06-16: 120 mg via ORAL
  Filled 2013-06-15: qty 1

## 2013-06-15 MED ORDER — METOPROLOL TARTRATE 12.5 MG HALF TABLET
12.5000 mg | ORAL_TABLET | Freq: Every morning | ORAL | Status: DC
  Administered 2013-06-16: 12.5 mg via ORAL
  Filled 2013-06-15: qty 1

## 2013-06-15 MED ORDER — ARTIFICIAL TEARS OP OINT
TOPICAL_OINTMENT | OPHTHALMIC | Status: DC | PRN
  Administered 2013-06-15: 1 via OPHTHALMIC

## 2013-06-15 MED ORDER — OXYCODONE HCL 5 MG PO TABS: 5.0000 mg | ORAL_TABLET | Freq: Once | ORAL | Status: AC | PRN

## 2013-06-15 MED ORDER — METOPROLOL TARTRATE 1 MG/ML IV SOLN: 2.0000 mg | INTRAVENOUS | Status: DC | PRN

## 2013-06-15 MED ORDER — ALUM & MAG HYDROXIDE-SIMETH 200-200-20 MG/5ML PO SUSP: 15.0000 mL | ORAL | Status: DC | PRN

## 2013-06-15 MED ORDER — DEXTRAN 40 IN SALINE 10-0.9 % IV SOLN
INTRAVENOUS | Status: AC
  Filled 2013-06-15: qty 500

## 2013-06-15 MED ORDER — PANTOPRAZOLE SODIUM 40 MG PO TBEC
40.0000 mg | DELAYED_RELEASE_TABLET | Freq: Every day | ORAL | Status: DC
  Administered 2013-06-16: 40 mg via ORAL
  Filled 2013-06-15: qty 1

## 2013-06-15 MED ORDER — ROCURONIUM BROMIDE 100 MG/10ML IV SOLN
INTRAVENOUS | Status: DC | PRN
  Administered 2013-06-15: 40 mg via INTRAVENOUS
  Administered 2013-06-15: 10 mg via INTRAVENOUS

## 2013-06-15 MED ORDER — PHENYLEPHRINE HCL 10 MG/ML IJ SOLN
10.0000 mg | INTRAVENOUS | Status: DC | PRN
  Administered 2013-06-15: 25 ug/min via INTRAVENOUS

## 2013-06-15 MED ORDER — SODIUM CHLORIDE 0.9 % IV SOLN: INTRAVENOUS | Status: DC

## 2013-06-15 MED ORDER — TIOTROPIUM BROMIDE MONOHYDRATE 18 MCG IN CAPS
18.0000 ug | ORAL_CAPSULE | Freq: Every day | RESPIRATORY_TRACT | Status: DC
  Administered 2013-06-16: 18 ug via RESPIRATORY_TRACT
  Filled 2013-06-15: qty 5

## 2013-06-15 MED ORDER — ACETAMINOPHEN 650 MG RE SUPP: 325.0000 mg | RECTAL | Status: DC | PRN

## 2013-06-15 MED ORDER — ALBUTEROL SULFATE HFA 108 (90 BASE) MCG/ACT IN AERS
INHALATION_SPRAY | RESPIRATORY_TRACT | Status: DC | PRN
  Administered 2013-06-15 (×3): 4 via RESPIRATORY_TRACT

## 2013-06-15 MED ORDER — PROPOFOL 10 MG/ML IV BOLUS
INTRAVENOUS | Status: DC | PRN
  Administered 2013-06-15: 130 mg via INTRAVENOUS

## 2013-06-15 MED ORDER — AMIODARONE HCL 200 MG PO TABS
200.0000 mg | ORAL_TABLET | Freq: Two times a day (BID) | ORAL | Status: DC
  Administered 2013-06-15 – 2013-06-16 (×2): 200 mg via ORAL
  Filled 2013-06-15 (×3): qty 1

## 2013-06-15 MED ORDER — LOSARTAN POTASSIUM 25 MG PO TABS
25.0000 mg | ORAL_TABLET | Freq: Every morning | ORAL | Status: DC
  Administered 2013-06-16: 25 mg via ORAL
  Filled 2013-06-15: qty 1

## 2013-06-15 SURGICAL SUPPLY — 51 items
BAG DECANTER FOR FLEXI CONT (MISCELLANEOUS) ×3 IMPLANT
CANISTER SUCTION 2500CC (MISCELLANEOUS) ×3 IMPLANT
CATH ROBINSON RED A/P 18FR (CATHETERS) ×3 IMPLANT
CATH SUCT 10FR WHISTLE TIP (CATHETERS) ×3 IMPLANT
CLIP TI MEDIUM 24 (CLIP) ×3 IMPLANT
CLIP TI WIDE RED SMALL 24 (CLIP) ×3 IMPLANT
CLOTH BEACON ORANGE TIMEOUT ST (SAFETY) ×3 IMPLANT
COVER PROBE W GEL 5X96 (DRAPES) ×3 IMPLANT
COVER SURGICAL LIGHT HANDLE (MISCELLANEOUS) ×3 IMPLANT
CRADLE DONUT ADULT HEAD (MISCELLANEOUS) ×3 IMPLANT
DERMABOND ADVANCED (GAUZE/BANDAGES/DRESSINGS) ×1
DERMABOND ADVANCED .7 DNX12 (GAUZE/BANDAGES/DRESSINGS) ×2 IMPLANT
DRAPE WARM FLUID 44X44 (DRAPE) ×3 IMPLANT
ELECT REM PT RETURN 9FT ADLT (ELECTROSURGICAL) ×3
ELECTRODE REM PT RTRN 9FT ADLT (ELECTROSURGICAL) ×2 IMPLANT
GLOVE BIO SURGEON STRL SZ 6.5 (GLOVE) ×6 IMPLANT
GLOVE BIO SURGEON STRL SZ7 (GLOVE) ×3 IMPLANT
GLOVE BIO SURGEON STRL SZ7.5 (GLOVE) ×3 IMPLANT
GLOVE BIOGEL PI IND STRL 7.0 (GLOVE) ×2 IMPLANT
GLOVE BIOGEL PI IND STRL 7.5 (GLOVE) ×4 IMPLANT
GLOVE BIOGEL PI INDICATOR 7.0 (GLOVE) ×1
GLOVE BIOGEL PI INDICATOR 7.5 (GLOVE) ×2
GOWN STRL NON-REIN LRG LVL3 (GOWN DISPOSABLE) IMPLANT
KIT BASIN OR (CUSTOM PROCEDURE TRAY) ×3 IMPLANT
KIT ROOM TURNOVER OR (KITS) ×3 IMPLANT
NS IRRIG 1000ML POUR BTL (IV SOLUTION) ×6 IMPLANT
PACK CAROTID (CUSTOM PROCEDURE TRAY) ×3 IMPLANT
PAD ARMBOARD 7.5X6 YLW CONV (MISCELLANEOUS) ×6 IMPLANT
PATCH VASCULAR VASCU GUARD 1X6 (Vascular Products) ×3 IMPLANT
SET COLLECT BLD 21X3/4 12 (NEEDLE) IMPLANT
SET COLLECT BLD 21X3/4 12 PB (MISCELLANEOUS) ×3 IMPLANT
SHUNT CAROTID BYPASS 10 (VASCULAR PRODUCTS) IMPLANT
SHUNT CAROTID BYPASS 12FRX15.5 (VASCULAR PRODUCTS) IMPLANT
SPONGE SURGIFOAM ABS GEL 100 (HEMOSTASIS) ×3 IMPLANT
STOPCOCK 4 WAY LG BORE MALE ST (IV SETS) ×3 IMPLANT
SUT ETHILON 3 0 PS 1 (SUTURE) IMPLANT
SUT MNCRL AB 4-0 PS2 18 (SUTURE) ×3 IMPLANT
SUT PROLENE 6 0 BV (SUTURE) ×9 IMPLANT
SUT PROLENE 7 0 BV 1 (SUTURE) IMPLANT
SUT SILK 3 0 TIES 17X18 (SUTURE)
SUT SILK 3-0 18XBRD TIE BLK (SUTURE) IMPLANT
SUT VIC AB 3-0 SH 27 (SUTURE) ×1
SUT VIC AB 3-0 SH 27X BRD (SUTURE) ×2 IMPLANT
SYR TB 1ML LUER SLIP (SYRINGE) IMPLANT
SYSTEM CHEST DRAIN TLS 7FR (DRAIN) IMPLANT
TOWEL OR 17X24 6PK STRL BLUE (TOWEL DISPOSABLE) ×3 IMPLANT
TOWEL OR 17X26 10 PK STRL BLUE (TOWEL DISPOSABLE) ×3 IMPLANT
TRAY FOLEY CATH 16FRSI W/METER (SET/KITS/TRAYS/PACK) ×3 IMPLANT
TUBING ART PRESS 48 MALE/FEM (TUBING) ×3 IMPLANT
TUBING EXTENTION W/L.L. (IV SETS) ×3 IMPLANT
WATER STERILE IRR 1000ML POUR (IV SOLUTION) ×3 IMPLANT

## 2013-06-15 NOTE — Telephone Encounter (Signed)
LVM re appt, sent letter - kf °

## 2013-06-15 NOTE — Telephone Encounter (Signed)
Message copied by Margaretmary Eddy on Wed Jun 15, 2013 12:36 PM ------      Message from: Melene Plan      Created: Wed Jun 15, 2013 11:28 AM                   ----- Message -----         From: Fransisco Hertz, MD         Sent: 06/15/2013  11:02 AM           To: Reuel Derby, Melene Plan, RN            Jason Larsen      [id number]      [birth year]            PROCEDURE:         1.  left carotid endarterectomy with bovine patch angioplasty      2.  left intraoperative carotid ultrasound            Asst: Doreatha Massed, PAC             Follow-up: 2 weeks ------

## 2013-06-15 NOTE — Addendum Note (Signed)
Addendum created 06/15/13 1333 by Sherie Don, CRNA   Modules edited: Anesthesia Medication Administration

## 2013-06-15 NOTE — Anesthesia Preprocedure Evaluation (Addendum)
Anesthesia Evaluation  Patient identified by MRN, date of birth, ID band Patient awake    Reviewed: Allergy & Precautions, H&P , NPO status , Patient's Chart, lab work & pertinent test results, reviewed documented beta blocker date and time   Airway Mallampati: II TM Distance: >3 FB     Dental  (+) Edentulous Upper and Edentulous Lower   Pulmonary  + rhonchi   + decreased breath sounds      Cardiovascular Rhythm:Regular Rate:Normal     Neuro/Psych    GI/Hepatic   Endo/Other    Renal/GU      Musculoskeletal   Abdominal   Peds  Hematology   Anesthesia Other Findings   Reproductive/Obstetrics                           Anesthesia Physical Anesthesia Plan  ASA: III  Anesthesia Plan: General   Post-op Pain Management:    Induction: Intravenous  Airway Management Planned: Oral ETT  Additional Equipment: Arterial line  Intra-op Plan:   Post-operative Plan:   Informed Consent: I have reviewed the patients History and Physical, chart, labs and discussed the procedure including the risks, benefits and alternatives for the proposed anesthesia with the patient or authorized representative who has indicated his/her understanding and acceptance.     Plan Discussed with: CRNA and Anesthesiologist  Anesthesia Plan Comments: (Asymptomatic L. Carotid stenosis  CAD S/P CABG 1997, Nuclear Stress Test 02/25/12  showed a mild perfusion defect is seen in the mid inferior and apical inferior regions consistent with infarct/scar and overlying attenuation EF 50-55% by ECHO Smoker/COPD recent episode of pneumonia Htn H/O Afib now in SR off coumadin x 5 days INR 1.12  Plan GA with art line  Kipp Brood, MD)      Anesthesia Quick Evaluation

## 2013-06-15 NOTE — H&P (View-Only) (Signed)
[facility]   New Carotid Patient  Referred by:  Virl Cagey, MD [phone]   [place], [place] [address]  Reason for referral: L carotid stenosis  History of Present Illness  Jason Larsen is a 72 y.o. ([birth year]) male who presents with chief complaint: dizziness.  Previous carotid studies demonstrated: RICA 60-79% stenosis, LICA 80-99% stenosis.  Patient has no history of TIA or stroke symptom.  The patient has never had amaurosis fugax or monocular blindness.  The patient has never had facial drooping or hemiplegia.  The patient has never had receptive or expressive aphasia.   The patient's risks factors for carotid disease include: htn, hyperlipidemia, AFib, DM, and active smoking.  Past Medical History  Diagnosis Date  . Coronary heart disease   . Hyperlipidemia   . HTN (hypertension)   . COPD (chronic obstructive pulmonary disease)   . PVD (peripheral vascular disease)   . Atrial fibrillation     converted to NSR 5/09 on pacerone  . Shortness of breath     on exertion  . Diabetes mellitus without complication   . Arthritis     fingers  . Anemia   . DJD (degenerative joint disease) of hip   . GI bleed     01/2013 while on warfarin with a hemoglobin of 5.8 (INR was 8)  . CHF (congestive heart failure)     EF 50-55%  . Carotid artery occlusion     Past Surgical History  Procedure Laterality Date  . Tonsillectomy      as child  . Colonoscopy N/A 02/23/2013    Procedure: COLONOSCOPY;  Surgeon: Louis Meckel, MD;  Location: [facility];  Service: Endoscopy;  Laterality: N/A;  . Esophagogastroduodenoscopy N/A 02/23/2013    Procedure: ESOPHAGOGASTRODUODENOSCOPY (EGD);  Surgeon: Louis Meckel, MD;  Location: [facility];  Service: Endoscopy;  Laterality: N/A;  . Hot hemostasis N/A 05/04/2013    Procedure: HOT HEMOSTASIS (ARGON PLASMA COAGULATION/BICAP);  Surgeon: Louis Meckel, MD;  Location: [facility];  Service: Endoscopy;   Laterality: N/A;  . Enteroscopy N/A 05/04/2013    Procedure: ENTEROSCOPY;  Surgeon: Louis Meckel, MD;  Location: [facility];  Service: Endoscopy;  Laterality: N/A;  . Bypass graft  1997 ?  Marland Kitchen Coronary artery bypass graft      15-17 yrs  . Joint replacement Right      Hip 10-12 yrs    History   Social History  . Marital Status: Married    Spouse Name: N/A    Number of Children: 3  . Years of Education: N/A   Occupational History  . retired- worked in a print shop x35 yrs    Social History Main Topics  . Smoking status: Current Some Day Smoker -- 0.10 packs/day for 54 years    Types: Cigarettes  . Smokeless tobacco: Never Used     Comment: Started smoking at age 52.  Currently smoking some days.    . Alcohol Use: No     Comment: Beer -haven't drank since Dec 2013  . Drug Use: No  . Sexually Active: Not on file   Other Topics Concern  . Not on file   Social History Narrative  . No narrative on file    Family History  Problem Relation Age of Onset  . Emphysema Father   . Heart disease Father   . Cancer Mother     unknwn type  . Tuberculosis Paternal Grandfather   .  Peripheral vascular disease Sister   . Hyperlipidemia Brother     Current Outpatient Prescriptions on File Prior to Visit  Medication Sig Dispense Refill  . albuterol (PROVENTIL) (2.5 MG/3ML) 0.083% nebulizer solution Take 2.5 mg by nebulization every 4 (four) hours as needed for shortness of breath.       . allopurinol (ZYLOPRIM) 300 MG tablet Take 150 mg by mouth every morning.       Marland Kitchen amiodarone (PACERONE) 200 MG tablet Takes 1 tablet am and 1/2 tablet pm daily.      Marland Kitchen aspirin EC 81 MG tablet Take 81 mg by mouth at bedtime.      Marland Kitchen diltiazem (DILACOR XR) 120 MG 24 hr capsule Take 1 capsule (120 mg total) by mouth every morning.  90 capsule  3  . ferrous sulfate 325 (65 FE) MG tablet Take 325 mg by mouth daily with breakfast.      . formoterol (FORADIL) 12 MCG capsule for inhaler Place 12 mcg into  inhaler and inhale 2 (two) times daily as needed for wheezing.       . furosemide (LASIX) 20 MG tablet Take 20 mg by mouth 2 (two) times daily.      Marland Kitchen losartan (COZAAR) 50 MG tablet Take 25 mg by mouth every morning.       . metoprolol tartrate (LOPRESSOR) 25 MG tablet Take 12.5 mg by mouth every morning.       Marland Kitchen omeprazole (PRILOSEC) 20 MG capsule Take 20 mg by mouth every morning.       . potassium chloride SA (K-DUR,KLOR-CON) 20 MEQ tablet Take 20 mEq by mouth every morning.      . simvastatin (ZOCOR) 20 MG tablet Take 10 mg by mouth at bedtime.       Marland Kitchen tiotropium (SPIRIVA) 18 MCG inhalation capsule Place 18 mcg into inhaler and inhale daily after supper.       . warfarin (COUMADIN) 1 MG tablet Take 1 tablet (1 mg total) by mouth as directed.  90 tablet  3   No current facility-administered medications on file prior to visit.    No Known Allergies  REVIEW OF SYSTEMS:  (Positives checked otherwise negative)  CARDIOVASCULAR:  []  chest pain, []  chest pressure, []  palpitations, []  shortness of breath when laying flat, [x]  shortness of breath with exertion,  []  pain in feet when walking, []  pain in feet when laying flat, []  history of blood clot in veins (DVT), []  history of phlebitis, [x]  swelling in legs, []  varicose veins  PULMONARY:  [x]  productive cough, []  asthma, [x]  wheezing  NEUROLOGIC:  []  weakness in arms or legs, []  numbness in arms or legs, []  difficulty speaking or slurred speech, []  temporary loss of vision in one eye, []  dizziness  HEMATOLOGIC:  [x]  bleeding problems, []  problems with blood clotting too easily  MUSCULOSKEL:  []  joint pain, []  joint swelling  GASTROINTEST:  []  vomiting blood, [x]  blood in stool     GENITOURINARY:  [x]  burning with urination, [x]  blood in urine  PSYCHIATRIC:  []  history of major depression  INTEGUMENTARY:  []  rashes, []  ulcers  CONSTITUTIONAL:  []  fever, []  chills  For VQI Use Only  PRE-ADM LIVING: Home  AMB STATUS:  Ambulatory  CAD Sx: None  PRIOR CHF: Asymptomatic, History of CHF  STRESS TEST: [x]  No, [ ]  Normal, [ ]  + ischemia, [ ]  + MI, [ ]  Both  Physical Examination  Filed Vitals:   06/10/13 1055  BP: 121/83  Pulse: 66  Temp: 97.7 F (36.5 C)  TempSrc: Oral  Resp: 16  Height: 5\' 4"  (1.626 m)  Weight: 135 lb (61.236 kg)  SpO2: 100%   Body mass index is 23.16 kg/(m^2).  General: A&O x 3, WDWN  Head: Mercedes/AT  Ear/Nose/Throat: Hearing grossly intact, nares w/o erythema or drainage, oropharynx w/o Erythema/Exudate, Mallampati score: 3, bullous nares  Eyes: PERRLA, EOMI  Neck: Supple, no nuchal rigidity, no palpable LAD  Pulmonary: Sym exp, good air movt, CTAB, no rales, rhonchi, & wheezing  Cardiac: RRR, Nl S1, S2, no Murmurs, rubs or gallops  Vascular: Vessel Right Left  Radial Faintly Palpable Faintly Palpable  Brachial Palpable Palpable  Carotid Palpable, without bruit Palpable, without bruit  Aorta Not palpable N/A  Femoral Palpable Palpable  Popliteal Not palpable Not palpable  PT Palpable Not Palpable  DP Palpable Not Palpable   Gastrointestinal: soft, NTND, -G/R, - HSM, - masses, - CVAT B  Musculoskeletal: M/S 5/5 throughout , Extremities without ischemic changes   Neurologic: CN 2-12 intact , Pain and light touch intact in extremities , Motor exam as listed above  Psychiatric: Judgment intact, Mood & affect appropriate for pt's clinical situation  Dermatologic: See M/S exam for extremity exam, no rashes otherwise noted  Lymph : No Cervical, Axillary, or Inguinal lymphadenopathy   Non-Invasive Vascular Imaging  CAROTID DUPLEX (Date: 06/10/2013):   R ICA stenosis: 60-79%  R VA: patent and antegrade  L ICA stenosis: 80-99%  L VA: patent and antegrade  I ordered and interpreted this study.    Outside Studies/Documentation 1 pages of outside documents were reviewed including: outside duplex.  Medical Decision Making  Jason Larsen is a 72 y.o. male  who presents with: known CAD, COPD requiring home oxygen, recent GI bleed, recently convert afib, active smoking, asx ICA stenosis 80-99%.   Based on the patient's vascular studies and examination, I have offered the patient: L CEA. I discussed with the patient the risks, benefits, and alternatives to carotid endarterectomy.   I discussed the procedural details of carotid endarterectomy with the patient.  The patient is aware that the risks of carotid endarterectomy include but are not limited to: bleeding, infection, stroke, myocardial infarction, death, cranial nerve injuries both temporary and permanent, neck hematoma, possible airway compromise, labile blood pressure post-operatively, cerebral hyperperfusion syndrome, and possible need for additional interventions in the future.  The patient is aware of the risks and agrees to proceed forward with the procedure. The patient has significant cardiopulmonary disease.  I will obtain Dr. Jari Sportsman input in regards need for any additional preoperative cardiac testing.    The patient will need to continue his Amiodarone for afib control and hold Coumadin for the procedure.  I discussed in depth with the patient the nature of atherosclerosis, and emphasized the importance of maximal medical management including strict control of blood pressure, blood glucose, and lipid levels, obtaining regular exercise, antiplatelet agents, and cessation of smoking.   The patient is currently on a statin: Zocor.  The patient is currently on an anti-platelet: ASA.    The patient is aware that without maximal medical management the underlying atherosclerotic disease process will progress, limiting the benefit of any interventions.  Thank you for allowing [place] to participate in this patient's care.  Leonides Sake, MD [facility] [place] Office: [phone] Pager: [phone]  06/10/2013, 11:42 AM

## 2013-06-15 NOTE — Anesthesia Procedure Notes (Signed)
Procedure Name: Intubation Date/Time: 06/15/2013 8:40 AM Performed by: Sherie Don Pre-anesthesia Checklist: Patient identified, Emergency Drugs available, Suction available, Patient being monitored and Timeout performed Patient Re-evaluated:Patient Re-evaluated prior to inductionOxygen Delivery Method: Circle system utilized Preoxygenation: Pre-oxygenation with 100% oxygen Intubation Type: IV induction Ventilation: Mask ventilation without difficulty, Two handed mask ventilation required and Oral airway inserted - appropriate to patient size Laryngoscope Size: Mac and 3 Grade View: Grade I Tube type: Oral Tube size: 7.5 mm Airway Equipment and Method: Stylet and LTA kit utilized Placement Confirmation: ETT inserted through vocal cords under direct vision and breath sounds checked- equal and bilateral Secured at: 22 cm Tube secured with: Tape Dental Injury: Teeth and Oropharynx as per pre-operative assessment

## 2013-06-15 NOTE — Progress Notes (Signed)
Patient voided 100 cc, bladder scan post void 647, patient then voided 75 cc.  Refused in and out cath at present time and requested more time to void.  Will continue to monitor. Dorie Rank

## 2013-06-15 NOTE — Interval H&P Note (Signed)
History and Physical Interval Note:  06/15/2013 7:54 AM  Jason Larsen  has presented today for surgery, with the diagnosis of Left Internal Carotid Artery Stenosis   The various methods of treatment have been discussed with the patient and family. After consideration of risks, benefits and other options for treatment, the patient has consented to  Procedure(s): ENDARTERECTOMY CAROTID- LEFT (Left) as a surgical intervention .  The patient's history has been reviewed, patient examined, no change in status, stable for surgery.  I have reviewed the patient's chart and labs.  Questions were answered to the patient's satisfaction.     Melvina Pangelinan LIANG-YU

## 2013-06-15 NOTE — Transfer of Care (Signed)
Immediate Anesthesia Transfer of Care Note  Patient: Jason Larsen  Procedure(s) Performed: Procedure(s): ENDARTERECTOMY CAROTID- LEFT (Left) PATCH ANGIOPLASTY using Vascu-Guard Vascular Patch (Left)  Patient Location: PACU  Anesthesia Type:General  Level of Consciousness: awake and responsive labored respirations Sats 100% on 10 L FM  Airway & Oxygen Therapy: Patient Spontanous Breathing and Patient connected to face mask oxygen Albuterol neb given upon arrival and Dr. Noreene Larsson has ordered CPAP  Post-op Assessment: Report given to PACU RN and Post -op Vital signs reviewed and stable  Post vital signs: Reviewed and stable  Complications: No apparent anesthesia complications

## 2013-06-15 NOTE — Preoperative (Signed)
Beta Blockers   Reason not to administer Beta Blockers:Lopressor taken this am

## 2013-06-15 NOTE — Progress Notes (Signed)
Utilization review completed.  

## 2013-06-15 NOTE — Op Note (Signed)
OPERATIVE NOTE  PROCEDURE:   1.  left carotid endarterectomy with bovine patch angioplasty 2.  left intraoperative carotid ultrasound  PRE-OPERATIVE DIAGNOSIS: left asymptomatic carotid stenosis >80 %  POST-OPERATIVE DIAGNOSIS: same as above   SURGEON: Leonides Sake, MD  ASSISTANT(S): Doreatha Massed, PAC   ANESTHESIA: general  ESTIMATED BLOOD LOSS: 100 cc  FINDING(S): 1.  Continuous doppler audible flow signatures are appropriate for each carotid artery. 2.  No evidence of intimal flap visualized on transverse or longitudinal ultrasonography. 3.  Heavily calcified plaque.  SPECIMEN(S):  None  INDICATIONS:  - Jason Larsen is a 72 y.o. male who presents with left asymptomatic carotid stenosis 80-99%. I discussed with the patient the risks, benefits, and alternatives to carotid endarterectomy.  I discussed the procedural details of carotid endarterectomy with the patient.  The patient is aware that the risks of carotid endarterectomy include but are not limited to: bleeding, infection, stroke, myocardial infarction, death, cranial nerve injuries both temporary and permanent, neck hematoma, possible airway compromise, labile blood pressure post-operatively, cerebral hyperperfusion syndrome, and possible need for additional interventions in the future. The patient is aware of the risks and agrees to proceed forward with the procedure.  DESCRIPTION: After full informed written consent was obtained from the patient, the patient was brought back to the operating room and placed supine upon the operating table.  Prior to induction, the patient received IV antibiotics.  After obtaining adequate anesthesia, the patient was placed into semi-Fowler position with a shoulder roll in place and the patient's neck slightly hyperextended and rotated away from the surgical site.  The patient was prepped in the standard fashion for a left carotid endarterectomy.  I made an incision anterior to the  sternocleidomastoid muscle and dissected down through the subcutaneous tissue.  The platysmas was opened with electrocautery.  Then I dissected down to the internal jugular vein.  This was dissected posteriorly until I obtained visualization of the common carotid artery.  This was dissected out and then an umbilical tape was placed around the common carotid artery and I loosely applied a Rumel tourniquet.  I then dissected in a periadventitial fashion along the common carotid artery up to the bifurcation.  I then identified the external carotid artery and the superior thyroid artery.  A 2-0 silk tie was looped around the superior thyroid artery, and I also dissected out the external carotid artery and placed a vessel loop around it.  In continuing the dissection to the internal carotid artery, I identified the facial vein.  This was ligated and then transected, giving me improved exposure of the internal carotid artery.  In the process of this dissection, the hypoglossal nerve was identified.  I then dissected out the internal carotid artery until I identified an area of soft tissue in the internal carotid artery.  Due to the high carotid bifurcation, I did not think a Rumel tourniquet could be applied to the internal carotid artery.   I dissected slightly distal to relatively disease free portion of the internal carotid artery and placed a vessel loop around the artery.  At this point, we gave the patient a therapeutic bolus of Heparin intravenously (roughly 80 units/kg).  After waiting 3 minutes, then I clamped the external carotid artery and then the common carotid artery.  Using a butterfly needle connected to the arterial pressure circuit, I cannulated the common carotid artery distal to the clamp.  The stump pressure was measured at: 50 mm Hg.  Based on this measurement,  I felt no shunt was needed.  I then made an arteriotomy in the common carotid artery with a 11 blade, and extended the arteriotomy with a  Potts scissor down into the common carotid artery, then I carried the arteriotomy through the bifurcation into the internal carotid artery until I reached an area that was not diseased.  At this point, I started the endarterectomy in the common carotid artery with a Cytogeneticist and carried this dissection down into the common carotid artery circumferentially.  Then I transected the plaque at a segment where it was adherent.  I then carried this dissection up into the external carotid artery.  The plaque was extracted by unclamping the external carotid artery and everting the artery.  The dissection was then carried into the internal carotid artery, extracting the remaining portion of the carotid plaque.  I passed the plaque off the field as a specimen.  I then spent the next 30 minutes removing intimal flaps and loose debris.  Eventually I reached the point where the residual plaque was densely adherent and any further dissection would compromise the integrity of the wall.  After verifying that there was no more loose intimal flaps or debris, I re-interrogated the entirety of this carotid artery.  At this point, I was satisfied that the minimal remaining disease was densely adherent to the wall and wall integrity was intact.  At this point, I then fashioned a bovine pericardial patch for the geometry of this artery and sewed it in place with two running stitch of 6-0 Prolene, one from each end.  Prior to completing this patch angioplasty, I backbled the external carotid artery and then reclamped it.  Back bleeding was: excellent.   I then backbled the internal carotid artery and then reclamped it.  Back bleeding was: excellent and pulsatile.   Finally, I backbled the common carotid artery and then reclamped it.  Back bleeding was: excellent.   I completed the patch angioplasty in the usual fashion.  First, I released the clamp on the external carotid artery, then I released it on the common carotid artery.   After waiting a few seconds, I then released it on the internal carotid artery.  I then interrogated this patient's arteries with the continuous Doppler.  The audible waveforms in each artery were consistent with the expected characteristics for each artery.  The Sonosite probe was then sterilely draped and used to interrogate the carotid artery in both longitudinal and transverse views.  At this point, I washed out the wound, and placed thrombin and Gelfoam throughout.  I also gave the patient 30 mg of protamine to reverse his anticoagulation.   After waiting a few minutes, I removed the thrombin and Gelfoam and washed out the wound.  There was no more active bleeding in the surgical site.   I then reapproximated the platysma muscle with a running stitch of 3-0 Vicryl.  The skin was then reapproximated with a running subcuticular 4-0 Monocryl stitch.  The skin was then cleaned, dried and Dermabond was used to reinforce the skin closure.  The patient woke without any problems, neurologically intact.     COMPLICATIONS: none  CONDITION: stable  Leonides Sake, MD [facility] [place] Office: [phone] Pager: [phone]  06/15/2013, 10:59 AM

## 2013-06-15 NOTE — Anesthesia Postprocedure Evaluation (Signed)
  Anesthesia Post-op Note  Patient: Jason Larsen  Procedure(s) Performed: Procedure(s): ENDARTERECTOMY CAROTID- LEFT (Left) PATCH ANGIOPLASTY using Vascu-Guard Vascular Patch (Left)  Patient Location: PACU  Anesthesia Type:General  Level of Consciousness: awake, alert  and oriented  Airway and Oxygen Therapy: Patient Spontanous Breathing and Patient connected to nasal cannula oxygen  Post-op Pain: mild  Post-op Assessment: Post-op Vital signs reviewed, Patient's Cardiovascular Status Stable, Respiratory Function Stable, Patent Airway and Pain level controlled  Post-op Vital Signs: stable  Complications: No apparent anesthesia complications

## 2013-06-16 ENCOUNTER — Encounter (HOSPITAL_COMMUNITY): Payer: Self-pay | Admitting: Vascular Surgery

## 2013-06-16 LAB — GLUCOSE, CAPILLARY
Glucose-Capillary: 98 mg/dL (ref 70–99)
Glucose-Capillary: 127 mg/dL — ABNORMAL HIGH (ref 70–99)

## 2013-06-16 LAB — CBC
HCT: 31.6 % — ABNORMAL LOW (ref 39.0–52.0)
Hemoglobin: 10.8 g/dL — ABNORMAL LOW (ref 13.0–17.0)
MCH: 30.7 pg (ref 26.0–34.0)
MCV: 89.8 fL (ref 78.0–100.0)
Platelets: 121 10*3/uL — ABNORMAL LOW (ref 150–400)
RBC: 3.52 MIL/uL — ABNORMAL LOW (ref 4.22–5.81)
WBC: 7.4 10*3/uL (ref 4.0–10.5)

## 2013-06-16 LAB — BASIC METABOLIC PANEL
BUN: 13 mg/dL (ref 6–23)
CO2: 27 mEq/L (ref 19–32)
Calcium: 8.7 mg/dL (ref 8.4–10.5)
Chloride: 104 mEq/L (ref 96–112)
Creatinine, Ser: 0.65 mg/dL (ref 0.50–1.35)
Glucose, Bld: 120 mg/dL — ABNORMAL HIGH (ref 70–99)

## 2013-06-16 NOTE — Discharge Summary (Signed)
[facility] Discharge Summary  Jason Larsen Oct 27, 1941 72 y.o. male  [id number]  Admission Date: 06/15/2013  Discharge Date: 06/16/13  Physician: Fransisco Hertz, MD  Admission Diagnosis: Left Internal Carotid Artery Stenosis    HPI:   This is a 73 y.o. male who presents with chief complaint: dizziness. Previous carotid studies demonstrated: RICA 60-79% stenosis, LICA 80-99% stenosis. Patient has no history of TIA or stroke symptom. The patient has never had amaurosis fugax or monocular blindness. The patient has never had facial drooping or hemiplegia. The patient has never had receptive or expressive aphasia. The patient's risks factors for carotid disease include: htn, hyperlipidemia, AFib, DM, and active smoking.  Hospital Course:  The patient was admitted to the hospital and taken to the operating room on 06/15/2013 and underwent left carotid endarterectomy.  The pt tolerated the procedure well and was transported to the PACU in good condition.   By POD 1, the pt neuro status in tact.  He did have trouble voiding overnight, but refused I&O cath.  By later in the morning, he was voiding without difficulty.   He also complained of dizziness and this did improve. His blood pressure has been stable throughout.  The remainder of the hospital course consisted of increasing mobilization and increasing intake of solids without difficulty.    Recent Labs  06/16/13 0410  NA 138  K 4.1  CL 104  CO2 27  GLUCOSE 120*  BUN 13  CALCIUM 8.7    Recent Labs  06/15/13 1815 06/16/13 0410  WBC 6.9 7.4  HGB 11.2* 10.8*  HCT 32.6* 31.6*  PLT 121* 121*    Recent Labs  06/15/13 0651  INR 1.22    Discharge Instructions:   The patient is discharged to home with extensive instructions on wound care and progressive ambulation.  They are instructed not to drive or perform any heavy lifting until returning to see the physician in his office.  Discharge Orders   Future  Appointments Provider Department Dept Phone   07/01/2013 10:45 AM Fransisco Hertz, MD [facility] [phone]   07/13/2013 9:00 AM Storm Frisk, MD [facility] [phone]   Future Orders Complete By Expires     CAROTID Sugery: Call MD for difficulty swallowing or speaking; weakness in arms or legs that is a new symtom; severe headache.  If you have increased swelling in the neck and/or  are having difficulty breathing, CALL 911  As directed     Call MD for:  redness, tenderness, or signs of infection (pain, swelling, bleeding, redness, odor or green/yellow discharge around incision site)  As directed     Call MD for:  severe or increased pain, loss or decreased feeling  in affected limb(s)  As directed     Call MD for:  temperature >100.5  As directed     Discharge wound care:  As directed     Comments:      Shower daily with soap and water starting 06/17/13    Driving Restrictions  As directed     Comments:      No driving for 2 weeks    Lifting restrictions  As directed     Comments:      No lifting for 2 weeks    Nursing communication  As directed     Scheduling Instructions:      Please give paper Rx to patient at discharge.  It is located on the paper  chart.    Resume previous diet  As directed        Discharge Diagnosis:  Left Internal Carotid Artery Stenosis   Secondary Diagnosis: Patient Active Problem List   Diagnosis Date Noted  . Dizziness and giddiness 06/10/2013  . Occlusion and stenosis of carotid artery without mention of cerebral infarction 06/10/2013  . Long term (current) use of anticoagulants 05/25/2013  . Left carotid bruit 05/16/2013  . Hypokalemia 03/29/2013  . Pleural effusion 03/29/2013  . CAP (community acquired pneumonia) 03/11/2013  . AVM (arteriovenous malformation) of colon with hemorrhage 02/23/2013  . Benign neoplasm of colon 02/23/2013  . Blood in stool 02/09/2013  . Personal history of colonic polyps  02/09/2013  . Hepatomegaly 02/09/2013  . Smoker 11/06/2011  . ALLERGIC RHINITIS 03/14/2008  . HYPERLIPIDEMIA 02/11/2008  . ANEMIA 02/11/2008  . HYPERTENSION 02/11/2008  . CORONARY HEART DISEASE 02/11/2008  . ATRIAL FIBRILLATION 02/11/2008  . PERIPHERAL VASCULAR DISEASE 02/11/2008  . CHRONIC OBSTRUCTIVE PULMONARY DISEASE, SEVERE Golds C 02/11/2008   Past Medical History  Diagnosis Date  . Coronary heart disease   . Hyperlipidemia   . HTN (hypertension)   . COPD (chronic obstructive pulmonary disease)   . PVD (peripheral vascular disease)   . Atrial fibrillation     converted to NSR 5/09 on pacerone  . Shortness of breath     on exertion  . Diabetes mellitus without complication   . Arthritis     fingers  . Anemia   . DJD (degenerative joint disease) of hip   . GI bleed     01/2013 while on warfarin with a hemoglobin of 5.8 (INR was 8)  . CHF (congestive heart failure)     EF 50-55%  . Carotid artery occlusion   . Complication of anesthesia   . PONV (postoperative nausea and vomiting)   . Cataracts, bilateral     Hx: of  . Heart murmur   . Pneumonia     Hx: of      Medication List         albuterol (2.5 MG/3ML) 0.083% nebulizer solution  Commonly known as:  PROVENTIL  Take 2.5 mg by nebulization every 4 (four) hours as needed for shortness of breath.     allopurinol 300 MG tablet  Commonly known as:  ZYLOPRIM  Take 150 mg by mouth every morning.     amiodarone 200 MG tablet  Commonly known as:  PACERONE  Take by mouth 2 (two) times daily. Takes 1 tablet am and 1/2 tablet pm daily.     aspirin EC 81 MG tablet  Take 81 mg by mouth at bedtime.     diltiazem 120 MG 24 hr capsule  Commonly known as:  DILACOR XR  Take 1 capsule (120 mg total) by mouth every morning.     ferrous sulfate 325 (65 FE) MG tablet  Take 325 mg by mouth daily with breakfast.     formoterol 12 MCG capsule for inhaler  Commonly known as:  FORADIL  Place 12 mcg into inhaler and  inhale 2 (two) times daily as needed for wheezing.     furosemide 20 MG tablet  Commonly known as:  LASIX  Take 20 mg by mouth daily.     losartan 50 MG tablet  Commonly known as:  COZAAR  Take 25 mg by mouth every morning.     metoprolol tartrate 25 MG tablet  Commonly known as:  LOPRESSOR  Take 12.5 mg by mouth every morning.  metoprolol 50 MG tablet  Commonly known as:  LOPRESSOR     moxifloxacin 400 MG tablet  Commonly known as:  AVELOX     omeprazole 20 MG capsule  Commonly known as:  PRILOSEC  Take 20 mg by mouth every morning.     oxyCODONE 5 MG immediate release tablet  Commonly known as:  ROXICODONE  Take 1 tablet (5 mg total) by mouth every 6 (six) hours as needed for pain.     potassium chloride SA 20 MEQ tablet  Commonly known as:  K-DUR,KLOR-CON  Take 20 mEq by mouth every morning.     simvastatin 20 MG tablet  Commonly known as:  ZOCOR  Take 10 mg by mouth at bedtime.     tiotropium 18 MCG inhalation capsule  Commonly known as:  SPIRIVA  Place 18 mcg into inhaler and inhale daily after supper.     warfarin 1 MG tablet  Commonly known as:  COUMADIN  Take 1 tablet (1 mg total) by mouth as directed.        Roxicodone #30 No Refill  Disposition: home  Patient's condition: is Good  Follow up: 1. Dr.  Imogene Burn in 2 weeks. 2. Dr. Kirke Corin Monday 06/20/13 for INR check   Doreatha Massed, PA-C [facility] [phone]  Addendum  I have independently interviewed and examined the patient, and I agree with the physician assistant's discharge summary.  The patient underwent an uneventful left carotid endarterectomy.  Post-operatively, his neurologic status was unchanged from preoperatively.  His BP was acceptable without any signs of carotid body disturbances.   He will follow up in the office in two weeks.  Leonides Sake, MD [facility] [place] Office: [phone] Pager: [phone]  06/16/2013, 3:22  PM    --- For VQI Registry use --- Instructions: Press F2 to tab through selections.  Delete question if not applicable.   Modified Rankin score at D/C (0-6): 0  IV medication needed for:  1. Hypertension: No 2. Hypotension: No  Post-op Complications: No  1. Post-op CVA or TIA: No  If yes: Event classification (right eye, left eye, right cortical, left cortical, verterobasilar, other): n/a  If yes: Timing of event (intra-op, <6 hrs post-op, >=6 hrs post-op, unknown): n/a  2. CN injury: No  If yes: CN n/a injuried   3. Myocardial infarction: No  If yes: Dx by (EKG or clinical, Troponin): n/a  4.  CHF: No  5.  Dysrhythmia (new): No  6. Wound infection: No  7. Reperfusion symptoms: No  8. Return to OR: No  If yes: return to OR for (bleeding, neurologic, other CEA incision, other): n/a   Discharge medications: Statin use:  Yes If No: [ ]  For Medical reasons, [ ]  Non-compliant, [ ]  Not-indicated ASA use:  Yes  If No: [ ]  For Medical reasons, [ ]  Non-compliant, [ ]  Not-indicated Beta blocker use:  Yes If No: [ ]  For Medical reasons, [ ]  Non-compliant, [ ]  Not-indicated ACE-Inhibitor use:  No-he is on ARB If No: [ ]  For Medical reasons, [ ]  Non-compliant, [ ]  Not-indicated P2Y12 Antagonist use: no, [ ]  Plavix, [ ]  Plasugrel, [ ]  Ticlopinine, [ ]  Ticagrelor, [ ]  Other, [ ]  No for medical reason, [ ]  Non-compliant, [ ]  Not-indicated Anti-coagulant use:  yes, [ x] Warfarin, [ ]  Rivaroxaban, [ ]  Dabigatran, [ ]  Other, [ ]  No for medical reason, [ ]  Non-compliant, [ ]  Not-indicated

## 2013-06-16 NOTE — Progress Notes (Addendum)
[facility] Progress Note  06/16/2013 7:29 AM 1 Day Post-Op  Subjective:  So dizziness  Afebrile VSS  Filed Vitals:   06/16/13 0400  BP: 116/51  Pulse: 86  Temp: 98.4 F (36.9 C)  Resp: 24     Physical Exam: Neuro:  In tact Incision:  C/d/i  CBC    Component Value Date/Time   WBC 7.4 06/16/2013 0410   RBC 3.52* 06/16/2013 0410   HGB 10.8* 06/16/2013 0410   HCT 31.6* 06/16/2013 0410   PLT 121* 06/16/2013 0410   MCV 89.8 06/16/2013 0410   MCH 30.7 06/16/2013 0410   MCHC 34.2 06/16/2013 0410   RDW 17.5* 06/16/2013 0410   LYMPHSABS 0.8 03/27/2013 2027   MONOABS 0.6 03/27/2013 2027   EOSABS 0.1 03/27/2013 2027   BASOSABS 0.0 03/27/2013 2027    BMET    Component Value Date/Time   NA 138 06/16/2013 0410   K 4.1 06/16/2013 0410   CL 104 06/16/2013 0410   CO2 27 06/16/2013 0410   GLUCOSE 120* 06/16/2013 0410   BUN 13 06/16/2013 0410   CREATININE 0.65 06/16/2013 0410   CALCIUM 8.7 06/16/2013 0410   GFRNONAA >90 06/16/2013 0410   GFRAA >90 06/16/2013 0410     Intake/Output Summary (Last 24 hours) at 06/16/13 0729 Last data filed at 06/16/13 0400  Gross per 24 hour  Intake 2917.5 ml  Output    650 ml  Net 2267.5 ml      Assessment/Plan:  This is a 72 y.o. male who is s/p left CEA 1 Day Post-Op  -pt is doing well this am. -pt neuro exam is in tact -pt has ambulated -will discharge home later this am if he can void without difficulty   Doreatha Massed, PA-C [facility] [phone]

## 2013-06-16 NOTE — Progress Notes (Signed)
Pt voiding and ambulating without difficulty.  Discharge instructions given to pt.  Pt verbalized understanding with all questions answered.  Pt discharged to home with brother.  Roselie Awkward, RN

## 2013-06-20 ENCOUNTER — Ambulatory Visit (INDEPENDENT_AMBULATORY_CARE_PROVIDER_SITE_OTHER): Payer: Medicare Other

## 2013-06-20 DIAGNOSIS — I4891 Unspecified atrial fibrillation: Secondary | ICD-10-CM

## 2013-06-20 DIAGNOSIS — Z7901 Long term (current) use of anticoagulants: Secondary | ICD-10-CM

## 2013-06-29 ENCOUNTER — Ambulatory Visit (INDEPENDENT_AMBULATORY_CARE_PROVIDER_SITE_OTHER): Payer: Medicare Other | Admitting: *Deleted

## 2013-06-29 DIAGNOSIS — I4891 Unspecified atrial fibrillation: Secondary | ICD-10-CM

## 2013-06-29 DIAGNOSIS — Z7901 Long term (current) use of anticoagulants: Secondary | ICD-10-CM

## 2013-06-29 LAB — POCT INR: INR: 1.9

## 2013-06-30 ENCOUNTER — Encounter: Payer: Self-pay | Admitting: Vascular Surgery

## 2013-07-01 ENCOUNTER — Ambulatory Visit (INDEPENDENT_AMBULATORY_CARE_PROVIDER_SITE_OTHER): Payer: Medicare Other | Admitting: Vascular Surgery

## 2013-07-01 ENCOUNTER — Encounter: Payer: Self-pay | Admitting: Vascular Surgery

## 2013-07-01 DIAGNOSIS — Z48812 Encounter for surgical aftercare following surgery on the circulatory system: Secondary | ICD-10-CM

## 2013-07-01 DIAGNOSIS — I6529 Occlusion and stenosis of unspecified carotid artery: Secondary | ICD-10-CM

## 2013-07-01 NOTE — Progress Notes (Signed)
[facility] [place]  Postoperative Visit  History of Present Illness  Jason Larsen is a 72 y.o. male who presents for postoperative follow-up for: L CEA (Date: 06/15/13).  The patient's neck incision is healed.  The patient has had no stroke or TIA symptoms.  Pt notes mild L jawline numbness.  For VQI Use Only  PRE-ADM LIVING: Home  AMB STATUS: Ambulatory  History   Social History  . Marital Status: Married    Spouse Name: N/A    Number of Children: 3  . Years of Education: N/A   Occupational History  . retired- worked in a print shop x35 yrs    Social History Main Topics  . Smoking status: Current Every Day Smoker -- 0.50 packs/day for 54 years    Types: Cigarettes  . Smokeless tobacco: Never Used     Comment: Started smoking at age 60.  Currently smoking some days.    . Alcohol Use: No     Comment: Beer -haven't drank since Dec 2013  . Drug Use: No  . Sexual Activity: Not on file   Other Topics Concern  . Not on file   Social History Narrative  . No narrative on file    Current Outpatient Prescriptions on File Prior to Visit  Medication Sig Dispense Refill  . albuterol (PROVENTIL) (2.5 MG/3ML) 0.083% nebulizer solution Take 2.5 mg by nebulization every 4 (four) hours as needed for shortness of breath.       . allopurinol (ZYLOPRIM) 300 MG tablet Take 150 mg by mouth every morning.       Marland Kitchen amiodarone (PACERONE) 200 MG tablet Take by mouth 2 (two) times daily. Takes 1 tablet am and 1/2 tablet pm daily.      Marland Kitchen aspirin EC 81 MG tablet Take 81 mg by mouth at bedtime.      Marland Kitchen diltiazem (DILACOR XR) 120 MG 24 hr capsule Take 1 capsule (120 mg total) by mouth every morning.  90 capsule  3  . ferrous sulfate 325 (65 FE) MG tablet Take 325 mg by mouth daily with breakfast.      . formoterol (FORADIL) 12 MCG capsule for inhaler Place 12 mcg into inhaler and inhale 2 (two) times daily as needed for wheezing.       . furosemide (LASIX) 20 MG tablet Take 20  mg by mouth daily.       Marland Kitchen losartan (COZAAR) 50 MG tablet Take 25 mg by mouth every morning.       . metoprolol (LOPRESSOR) 50 MG tablet       . metoprolol tartrate (LOPRESSOR) 25 MG tablet Take 12.5 mg by mouth every morning.       . moxifloxacin (AVELOX) 400 MG tablet       . omeprazole (PRILOSEC) 20 MG capsule Take 20 mg by mouth every morning.       . potassium chloride SA (K-DUR,KLOR-CON) 20 MEQ tablet Take 20 mEq by mouth every morning.      . simvastatin (ZOCOR) 20 MG tablet Take 10 mg by mouth at bedtime.       Marland Kitchen tiotropium (SPIRIVA) 18 MCG inhalation capsule Place 18 mcg into inhaler and inhale daily after supper.       . warfarin (COUMADIN) 1 MG tablet Take 1 tablet (1 mg total) by mouth as directed.  90 tablet  3  . oxyCODONE (ROXICODONE) 5 MG immediate release tablet Take 1 tablet (5 mg total) by mouth every 6 (  six) hours as needed for pain.  30 tablet  0   No current facility-administered medications on file prior to visit.    Physical Examination  Filed Vitals:   07/01/13 1057  BP: 97/58  Pulse:   Temp:     L Neck: Incision is healed, Dermabond is still Neuro: CN 2-12 are intact except mild L angle of mouth droop, Motor strength is 5/5 bilaterally, sensation is grossly intact  Medical Decision Making  Jason Larsen is a 72 y.o. male who presents s/p L CEA with evidence of mild marginal mandibular palsy, asx R ICA stenosis 60-79%  The marginal mandibular palsy is not unexpected and is due to the assistant's retraction during the case.  I expect this to resolve with time.  The patient's neck incision is healing with no stroke symptoms. I discussed in depth with the patient the nature of atherosclerosis, and emphasized the importance of maximal medical management including strict control of blood pressure, blood glucose, and lipid levels, obtaining regular exercise, anti-platelet use and cessation of smoking.   The patient is currently on an antiplatelet: ASA. The  patient is currently on a statin: Zocor.  The patient is aware that without maximal medical management the underlying atherosclerotic disease process will progress, limiting the benefit of any interventions. The patient's surveillance will included routine carotid duplex studies which will be completed in: 6 months, at which time the patient will be re-evaluated.   I emphasized the importance of routine surveillance of the carotid arteries as recurrence of stenosis is possible, especially with proper management of underlying atherosclerotic disease. The patient agrees to participate in their maximal medical care and routine surveillance.  Thank you for allowing [place] to participate in this patient's care.  Leonides Sake, MD [facility] [place] Office: [phone] Pager: [phone]

## 2013-07-01 NOTE — Addendum Note (Signed)
Addended by: Adria Dill L on: 07/01/2013 01:38 PM   Modules accepted: Orders

## 2013-07-13 ENCOUNTER — Encounter: Payer: Self-pay | Admitting: Critical Care Medicine

## 2013-07-13 ENCOUNTER — Ambulatory Visit (INDEPENDENT_AMBULATORY_CARE_PROVIDER_SITE_OTHER): Payer: Medicare Other | Admitting: Critical Care Medicine

## 2013-07-13 ENCOUNTER — Ambulatory Visit (INDEPENDENT_AMBULATORY_CARE_PROVIDER_SITE_OTHER): Payer: Medicare Other

## 2013-07-13 VITALS — BP 106/62 | HR 58 | Temp 97.2°F | Ht 64.0 in | Wt 137.4 lb

## 2013-07-13 DIAGNOSIS — I4891 Unspecified atrial fibrillation: Secondary | ICD-10-CM

## 2013-07-13 DIAGNOSIS — Z7901 Long term (current) use of anticoagulants: Secondary | ICD-10-CM

## 2013-07-13 DIAGNOSIS — F17200 Nicotine dependence, unspecified, uncomplicated: Secondary | ICD-10-CM

## 2013-07-13 DIAGNOSIS — Z23 Encounter for immunization: Secondary | ICD-10-CM

## 2013-07-13 DIAGNOSIS — J449 Chronic obstructive pulmonary disease, unspecified: Secondary | ICD-10-CM

## 2013-07-13 LAB — POCT INR: INR: 3.8

## 2013-07-13 NOTE — Addendum Note (Signed)
Addended by: Gweneth Dimitri D on: 07/13/2013 09:37 AM   Modules accepted: Orders

## 2013-07-13 NOTE — Assessment & Plan Note (Signed)
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 stable at this time History of  Community acquired pneumonia and pleural effusion now resolved Plan Use Foradil more regular basis Continue Spiriva Albuterol as needed Administer flu and Pneumovax  smoking cessation with Nicorette minis 3-10 minutes of smoking cessation counseling given to the patient

## 2013-07-13 NOTE — Patient Instructions (Signed)
Flu vaccine and pneumovax will be given No change in medications Consider nicorette minis 4mg  as needed for smoking cessation Return 4 months

## 2013-07-13 NOTE — Progress Notes (Signed)
Subjective:    Patient ID: Jason Larsen, male    DOB: 05-15-1941, 72 y.o.   MRN: [id number]  HPI 72 y.o. wm active smoker followed by Dr Delford Field with known hx of COPD  Atrial fib on Amiodarone   04/11/2013 At last OV we performed a thoracentesis, results showed it was a transudate.  C/S neg. Cytology neg for CA Since fluid removal is better.  Min cough, and is a sl rattle. Pt will cough a lot when gets on the edge of the bed then will be ok.  Pt for small bowel capsule endoscopy Min smoking  07/13/2013 Chief Complaint  Patient presents with  . 3 month follow up    Breathing is doing "great."  Does c/o hoarseness since this morning.  No SOB, cough, chest tightness, or chest pain.  The patient is stable at present with no real change in dyspnea and no chest pain. There is minimal mucus production. The patient continues to smoke 2-3 cigarettes per day. Pt denies any significant sore throat, nasal congestion or excess secretions, fever, chills, sweats, unintended weight loss, pleurtic or exertional chest pain, orthopnea PND, or leg swelling Pt denies any increase in rescue therapy over baseline, denies waking up needing it or having any early am or nocturnal exacerbations of coughing/wheezing/or dyspnea. Pt also denies any obvious fluctuation in symptoms with  weather or environmental change or other alleviating or aggravating factors   Review of Systems  Constitutional:   No  weight loss, night sweats,  Fevers, chills, + fatigue, lassitude. HEENT:   No headaches,  Difficulty swallowing,  Tooth/dental problems,  Sore throat,                No sneezing, itching, ear ache, +nasal congestion, post nasal drip,  Patient notes hoarseness CV:  No chest pain,  Orthopnea, PND, swelling in lower extremities, anasarca, dizziness, palpitations  GI  No heartburn, indigestion, abdominal pain, nausea, vomiting, diarrhea, change in bowel habits, loss of appetite  Resp: Notes shortness of breath with  exertion  No wheezing.  No chest wall deformity  Skin: no rash or lesions.  GU: no dysuria, change in color of urine, no urgency or frequency.  No flank pain.  MS:  No joint pain or swelling.  No decreased range of motion.  No back pain.  Psych:  No change in mood or affect. No depression or anxiety.  No memory loss.     Objective:   Physical Exam BP 106/62  Pulse 58  Temp(Src) 97.2 F (36.2 C) (Oral)  Ht 5\' 4"  (1.626 m)  Wt 137 lb 6.4 oz (62.324 kg)  BMI 23.57 kg/m2  SpO2 97%  Gen: Pleasant, well-nourished, in no distress,  normal affect  ENT: No lesions,  mouth clear,  oropharynx clear, no postnasal drip  Neck: No JVD, no TMG, no carotid bruits  Lungs: No use of accessory muscles, coarse breath sounds with improved aeration right lung  Cardiovascular: RRR, heart sounds normal, no murmur or gallops, no peripheral edema  Abdomen: soft and NT, no HSM,  BS normal  Musculoskeletal: No deformities, no cyanosis or clubbing  Neuro: alert, non focal  Skin: Warm, no lesions or rashes     Assessment & Plan:   CHRONIC OBSTRUCTIVE PULMONARY DISEASE, SEVERE Golds C Chronic obstructive lung disease gold stage C. stable at this time History of  Community acquired pneumonia and pleural effusion now resolved Plan Use Foradil more regular basis Continue Spiriva Albuterol as needed Administer flu and  Pneumovax  smoking cessation with Nicorette minis 3-10 minutes of smoking cessation counseling given to the patient     Updated Medication List Outpatient Encounter Prescriptions as of 07/13/2013  Medication Sig Dispense Refill  . albuterol (PROVENTIL) (2.5 MG/3ML) 0.083% nebulizer solution Take 2.5 mg by nebulization every 4 (four) hours as needed for shortness of breath.       . allopurinol (ZYLOPRIM) 300 MG tablet Take 150 mg by mouth every morning.       Marland Kitchen amiodarone (PACERONE) 200 MG tablet Take by mouth 2 (two) times daily. Takes 1 tablet am and 1/2 tablet pm daily.       Marland Kitchen aspirin EC 81 MG tablet Take 81 mg by mouth at bedtime.      Marland Kitchen diltiazem (DILACOR XR) 120 MG 24 hr capsule Take 1 capsule (120 mg total) by mouth every morning.  90 capsule  3  . ferrous sulfate 325 (65 FE) MG tablet Take 325 mg by mouth daily with breakfast.      . formoterol (FORADIL) 12 MCG capsule for inhaler Place 12 mcg into inhaler and inhale 2 (two) times daily as needed for wheezing.       . furosemide (LASIX) 20 MG tablet Take 20 mg by mouth daily.       Marland Kitchen losartan (COZAAR) 50 MG tablet Take 25 mg by mouth every morning.       . metoprolol tartrate (LOPRESSOR) 25 MG tablet Take 12.5 mg by mouth every morning.       Marland Kitchen omeprazole (PRILOSEC) 20 MG capsule Take 20 mg by mouth every morning.       Marland Kitchen oxyCODONE (ROXICODONE) 5 MG immediate release tablet Take 1 tablet (5 mg total) by mouth every 6 (six) hours as needed for pain.  30 tablet  0  . potassium chloride SA (K-DUR,KLOR-CON) 20 MEQ tablet Take 20 mEq by mouth every morning.      . simvastatin (ZOCOR) 20 MG tablet Take 10 mg by mouth at bedtime.       Marland Kitchen tiotropium (SPIRIVA) 18 MCG inhalation capsule Place 18 mcg into inhaler and inhale daily after supper.       . warfarin (COUMADIN) 1 MG tablet Take 1 tablet (1 mg total) by mouth as directed.  90 tablet  3  . [DISCONTINUED] metoprolol (LOPRESSOR) 50 MG tablet       . [DISCONTINUED] moxifloxacin (AVELOX) 400 MG tablet        No facility-administered encounter medications on file as of 07/13/2013.

## 2013-07-27 ENCOUNTER — Ambulatory Visit (INDEPENDENT_AMBULATORY_CARE_PROVIDER_SITE_OTHER): Payer: Medicare Other

## 2013-07-27 DIAGNOSIS — I4891 Unspecified atrial fibrillation: Secondary | ICD-10-CM

## 2013-07-27 DIAGNOSIS — Z7901 Long term (current) use of anticoagulants: Secondary | ICD-10-CM

## 2013-08-10 ENCOUNTER — Ambulatory Visit (INDEPENDENT_AMBULATORY_CARE_PROVIDER_SITE_OTHER): Payer: Medicare Other | Admitting: General Practice

## 2013-08-10 DIAGNOSIS — I4891 Unspecified atrial fibrillation: Secondary | ICD-10-CM

## 2013-08-10 DIAGNOSIS — Z7901 Long term (current) use of anticoagulants: Secondary | ICD-10-CM

## 2013-08-23 ENCOUNTER — Ambulatory Visit: Payer: Medicare Other | Admitting: Cardiovascular Disease

## 2013-08-25 ENCOUNTER — Encounter: Payer: Self-pay | Admitting: Cardiovascular Disease

## 2013-08-25 ENCOUNTER — Ambulatory Visit (INDEPENDENT_AMBULATORY_CARE_PROVIDER_SITE_OTHER): Payer: Medicare Other | Admitting: Cardiovascular Disease

## 2013-08-25 VITALS — BP 129/70 | HR 78 | Ht 64.0 in | Wt 136.5 lb

## 2013-08-25 DIAGNOSIS — I2510 Atherosclerotic heart disease of native coronary artery without angina pectoris: Secondary | ICD-10-CM

## 2013-08-25 DIAGNOSIS — D649 Anemia, unspecified: Secondary | ICD-10-CM

## 2013-08-25 DIAGNOSIS — R0989 Other specified symptoms and signs involving the circulatory and respiratory systems: Secondary | ICD-10-CM

## 2013-08-25 DIAGNOSIS — R000 Tachycardia, unspecified: Secondary | ICD-10-CM

## 2013-08-25 DIAGNOSIS — E785 Hyperlipidemia, unspecified: Secondary | ICD-10-CM

## 2013-08-25 DIAGNOSIS — I4891 Unspecified atrial fibrillation: Secondary | ICD-10-CM

## 2013-08-25 MED ORDER — AMIODARONE HCL 200 MG PO TABS: 200.0000 mg | ORAL_TABLET | Freq: Every day | ORAL | Status: DC

## 2013-08-25 NOTE — Progress Notes (Signed)
HPI  This is a 72 year old male who is here today for a followup visit. He has known history of coronary artery disease status post four-vessel CABG in 1997 at [facility], persistent atrial fibrillation maintained in sinus rhythm on amiodarone, COPD with tobacco use, hyperlipidemia, hypertension, carotid artery disease status post left carotid endarterectomy, type 2 diabetes and GI bleed in March of 2014 with supratherapeutic INR (8).  Most recent echocardiogram was done in March of 2014 which showed an ejection fraction of 50-55%, moderately dilated left atrium, mild to moderate MR and moderate pulmonary hypertension. He was hospitalized in March of 2014 with GI bleed and hemoglobin of 5.8. His INR was 8 on presentation. EGD showed no obvious source of bleeding. He was subsequently evaluated by Dr. [name]. Colonoscopy showed AVMs in the cecum which were cauterized. He was placed back on warfarin. 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 He  underwent laser ablation on June 25. Warfarin was resumed with no recurrent GI bleed. He has been doing well and denies any chest pain or significant dyspnea. Metoprolol was recently stopped by his primary care physician due to bradycardia. He is having hematuria which is currently being investigated by urology.  No Known Allergies   Current Outpatient Prescriptions on File Prior to Visit  Medication Sig Dispense Refill  . albuterol (PROVENTIL) (2.5 MG/3ML) 0.083% nebulizer solution Take 2.5 mg by nebulization every 4 (four) hours as needed for shortness of breath.       . allopurinol (ZYLOPRIM) 300 MG tablet Take 150 mg by mouth every morning.       Marland Kitchen amiodarone (PACERONE) 200 MG tablet Take by mouth 2 (two) times daily. Takes 1 tablet am and 1/2 tablet pm daily.      Marland Kitchen aspirin EC 81 MG tablet Take 81 mg by mouth at bedtime.      Marland Kitchen diltiazem (DILACOR XR) 120 MG 24 hr capsule Take 1 capsule (120 mg total) by mouth every morning.  90 capsule  3  . ferrous  sulfate 325 (65 FE) MG tablet Take 325 mg by mouth daily with breakfast.      . formoterol (FORADIL) 12 MCG capsule for inhaler Place 12 mcg into inhaler and inhale 2 (two) times daily as needed for wheezing.       . furosemide (LASIX) 20 MG tablet Take 20 mg by mouth daily.       Marland Kitchen losartan (COZAAR) 50 MG tablet Take 25 mg by mouth every morning.       Marland Kitchen omeprazole (PRILOSEC) 20 MG capsule Take 20 mg by mouth every morning.       . potassium chloride SA (K-DUR,KLOR-CON) 20 MEQ tablet Take 20 mEq by mouth every morning.      . simvastatin (ZOCOR) 20 MG tablet Take 10 mg by mouth at bedtime.       Marland Kitchen tiotropium (SPIRIVA) 18 MCG inhalation capsule Place 18 mcg into inhaler and inhale daily after supper.       . warfarin (COUMADIN) 1 MG tablet Take 1 tablet (1 mg total) by mouth as directed.  90 tablet  3  . metoprolol tartrate (LOPRESSOR) 25 MG tablet Take 12.5 mg by mouth every morning.        No current facility-administered medications on file prior to visit.     Past Medical History  Diagnosis Date  . Coronary heart disease   . Hyperlipidemia   . HTN (hypertension)   . COPD (chronic obstructive pulmonary disease)   .  PVD (peripheral vascular disease)   . Atrial fibrillation     converted to NSR 5/09 on pacerone  . Shortness of breath     on exertion  . Diabetes mellitus without complication   . Arthritis     fingers  . Anemia   . DJD (degenerative joint disease) of hip   . GI bleed     01/2013 while on warfarin with a hemoglobin of 5.8 (INR was 8)  . CHF (congestive heart failure)     EF 50-55%  . Carotid artery occlusion   . Complication of anesthesia   . PONV (postoperative nausea and vomiting)   . Cataracts, bilateral     Hx: of  . Heart murmur   . Pneumonia     Hx: of     Past Surgical History  Procedure Laterality Date  . Tonsillectomy      as child  . Colonoscopy N/A 02/23/2013    Procedure: COLONOSCOPY;  Surgeon: Louis Meckel, MD;  Location: [facility];   Service: Endoscopy;  Laterality: N/A;  . Esophagogastroduodenoscopy N/A 02/23/2013    Procedure: ESOPHAGOGASTRODUODENOSCOPY (EGD);  Surgeon: Louis Meckel, MD;  Location: [facility];  Service: Endoscopy;  Laterality: N/A;  . Hot hemostasis N/A 05/04/2013    Procedure: HOT HEMOSTASIS (ARGON PLASMA COAGULATION/BICAP);  Surgeon: Louis Meckel, MD;  Location: [facility];  Service: Endoscopy;  Laterality: N/A;  . Enteroscopy N/A 05/04/2013    Procedure: ENTEROSCOPY;  Surgeon: Louis Meckel, MD;  Location: [facility];  Service: Endoscopy;  Laterality: N/A;  . Bypass graft  1997 ?  Marland Kitchen Coronary artery bypass graft      15-17 yrs  . Joint replacement Right      Hip 10-12 yrs  . Cardiac catheterization    . Endarterectomy Left 06/15/2013    Procedure: ENDARTERECTOMY CAROTID- LEFT;  Surgeon: Fransisco Hertz, MD;  Location: [facility] OR;  Service: Vascular;  Laterality: Left;  . Patch angioplasty Left 06/15/2013    Procedure: PATCH ANGIOPLASTY using Vascu-Guard Vascular Patch;  Surgeon: Fransisco Hertz, MD;  Location: [facility] OR;  Service: Vascular;  Laterality: Left;  . Carotid endarterectomy       Family History  Problem Relation Age of Onset  . Emphysema Father   . Heart disease Father   . Cancer Mother     unknwn type  . Tuberculosis Paternal Grandfather   . Peripheral vascular disease Sister   . Hyperlipidemia Brother      History   Social History  . Marital Status: Married    Spouse Name: N/A    Number of Children: 3  . Years of Education: N/A   Occupational History  . retired- worked in a print shop x35 yrs    Social History Main Topics  . Smoking status: Current Every Day Smoker -- 0.25 packs/day    Types: Cigarettes  . Smokeless tobacco: Never Used     Comment: Started smoking at age 72.    Marland Kitchen Alcohol Use: No     Comment: Beer -haven't drank since Dec 2013  . Drug Use: No  . Sexual Activity: Not on file   Other Topics Concern  . Not on file   Social History Narrative  . No  narrative on file      PHYSICAL EXAM   BP 129/70  Pulse 78  Ht 5\' 4"  (1.626 m)  Wt 136 lb 8 oz (61.916 kg)  BMI 23.42 kg/m2 Constitutional: He is oriented to person, place, and  time. He appears well-developed and well-nourished. No distress.  HENT: No nasal discharge.  Head: Normocephalic and atraumatic.  Eyes: Pupils are equal and round.  Neck: Normal range of motion. Neck supple. No JVD present. No thyromegaly present. There is a faint left carotid bruit. Cardiovascular: Normal rate, regular rhythm, normal heart sounds. Exam reveals no gallop and no friction rub. There is a 2/6 systolic murmur at the left sternal border. Pulmonary/Chest: Effort normal and breath sounds normal. No stridor. No respiratory distress. He has no wheezes. He has no rales. He exhibits no tenderness.  Abdominal: Soft. Bowel sounds are normal. He exhibits no distension. There is no tenderness. There is no rebound and no guarding.  Musculoskeletal: Normal range of motion. He exhibits no edema and no tenderness.  Neurological: He is alert and oriented to person, place, and time. Coordination normal.  Skin: Skin is warm and dry. No rash noted. He is not diaphoretic. No erythema. No pallor.  Psychiatric: He has a normal mood and affect. His behavior is normal. Judgment and thought content normal.       EKG: Sinus  Rhythm  -First degree A-V block  PRi = 266 -Nonspecific ST changes  ABNORMAL    ASSESSMENT AND PLAN

## 2013-08-25 NOTE — Assessment & Plan Note (Signed)
He underwent left carotid endarterectomy few months ago due to severe disease.

## 2013-08-25 NOTE — Assessment & Plan Note (Signed)
He is maintaining in sinus rhythm on amiodarone. I agree with stopping metoprolol due to bradycardia. He is already on amiodarone and diltiazem. I am also going to decrease the dose of amiodarone to 200 mg once daily. Check liver and thyroid panel. Continue long-term anticoagulation with warfarin which can be held for 5 days before cystoscopy as needed.

## 2013-08-25 NOTE — Assessment & Plan Note (Signed)
No symptoms suggestive of angina. Continue medical therapy. 

## 2013-08-25 NOTE — Patient Instructions (Signed)
Change Amiodarone to 200 mg once daily.  Labs today.   Your physician wants you to follow-up in: 3 months.  You will receive a reminder letter in the mail two months in advance. If you don't receive a letter, please call our office to schedule the follow-up appointment.

## 2013-08-25 NOTE — Assessment & Plan Note (Signed)
Recheck CBC today. 

## 2013-08-26 ENCOUNTER — Telehealth: Payer: Self-pay

## 2013-08-26 DIAGNOSIS — I10 Essential (primary) hypertension: Secondary | ICD-10-CM

## 2013-08-26 DIAGNOSIS — R160 Hepatomegaly, not elsewhere classified: Secondary | ICD-10-CM

## 2013-08-26 LAB — BASIC METABOLIC PANEL
BUN/Creatinine Ratio: 22 (ref 10–22)
Creatinine, Ser: 0.96 mg/dL (ref 0.76–1.27)
GFR calc non Af Amer: 78 mL/min/{1.73_m2} (ref >59)
Sodium: 142 mmol/L (ref 134–144)

## 2013-08-26 LAB — HEPATIC FUNCTION PANEL
ALT: 85 IU/L — ABNORMAL HIGH (ref 0–44)
AST: 65 IU/L — ABNORMAL HIGH (ref 0–40)
Bilirubin, Direct: 0.14 mg/dL (ref 0.00–0.40)
Total Bilirubin: 0.4 mg/dL (ref 0.0–1.2)

## 2013-08-26 LAB — CBC WITH DIFFERENTIAL/PLATELET
Basos: 0 %
Eos: 1 %
HCT: 35.9 % — ABNORMAL LOW (ref 37.5–51.0)
Lymphocytes Absolute: 0.8 10*3/uL (ref 0.7–3.1)
MCV: 100 fL — ABNORMAL HIGH (ref 79–97)
Monocytes Absolute: 0.6 10*3/uL (ref 0.1–0.9)
Neutrophils Absolute: 5 10*3/uL (ref 1.4–7.0)
RBC: 3.59 x10E6/uL — ABNORMAL LOW (ref 4.14–5.80)
WBC: 6.6 10*3/uL (ref 3.4–10.8)

## 2013-08-26 NOTE — Telephone Encounter (Signed)
Spoke w/ pt.  He is aware of results and agreeable to plan of stopping potassium supplement and repeating liver profile in 6 weeks.

## 2013-08-26 NOTE — Telephone Encounter (Signed)
Message copied by Marilynne Halsted on Fri Aug 26, 2013  5:14 PM ------      Message from: Lorine Bears A      Created: Fri Aug 26, 2013  5:12 PM       Anemia improved.       Potassium is high. Stop Potassium supplement.       Liver test is mildly abnormal which could be due to Amiodarone. We already decreased the dose during last visit. Recommend repeat liver profile in 6 weeks. ------

## 2013-08-31 ENCOUNTER — Ambulatory Visit (INDEPENDENT_AMBULATORY_CARE_PROVIDER_SITE_OTHER): Payer: Medicare Other | Admitting: General Practice

## 2013-08-31 DIAGNOSIS — I4891 Unspecified atrial fibrillation: Secondary | ICD-10-CM

## 2013-08-31 DIAGNOSIS — Z7901 Long term (current) use of anticoagulants: Secondary | ICD-10-CM

## 2013-09-02 ENCOUNTER — Telehealth: Payer: Self-pay | Admitting: *Deleted

## 2013-09-02 ENCOUNTER — Other Ambulatory Visit: Payer: Self-pay

## 2013-09-02 MED ORDER — LOSARTAN POTASSIUM 50 MG PO TABS: 25.0000 mg | ORAL_TABLET | Freq: Every morning | ORAL | Status: DC

## 2013-09-02 NOTE — Telephone Encounter (Signed)
Returned call and pt verified x 2.  Pt informed message received and refill was done by Dr. Jari Sportsman office within the hour.  Pt stated he called their office too and they refilled it.  RN apologized for delay.  Pt verbalized understanding and will pick up Rx.

## 2013-09-02 NOTE — Telephone Encounter (Signed)
Pt was calling in regards to his Rx refill for Losartan saying that it was never sent to the pharmacy.

## 2013-09-02 NOTE — Telephone Encounter (Signed)
Requested Prescriptions   Signed Prescriptions Disp Refills  . losartan (COZAAR) 50 MG tablet 45 tablet 3    Sig: Take 0.5 tablets (25 mg total) by mouth every morning.    Authorizing Provider: Lorine Bears A    Ordering User: Kendrick Fries

## 2013-09-21 ENCOUNTER — Ambulatory Visit (INDEPENDENT_AMBULATORY_CARE_PROVIDER_SITE_OTHER): Payer: Medicare Other | Admitting: *Deleted

## 2013-09-21 DIAGNOSIS — I4891 Unspecified atrial fibrillation: Secondary | ICD-10-CM

## 2013-09-21 DIAGNOSIS — Z7901 Long term (current) use of anticoagulants: Secondary | ICD-10-CM

## 2013-10-10 ENCOUNTER — Other Ambulatory Visit: Payer: Medicare Other

## 2013-10-11 ENCOUNTER — Ambulatory Visit: Payer: Self-pay | Admitting: Urology

## 2013-10-11 LAB — BASIC METABOLIC PANEL
Anion Gap: 1 — ABNORMAL LOW (ref 7–16)
Calcium, Total: 9.1 mg/dL (ref 8.5–10.1)
Co2: 32 mmol/L (ref 21–32)
Creatinine: 0.88 mg/dL (ref 0.60–1.30)
Potassium: 3.9 mmol/L (ref 3.5–5.1)

## 2013-10-11 LAB — CBC WITH DIFFERENTIAL/PLATELET
Basophil #: 0.1 10*3/uL (ref 0.0–0.1)
Basophil %: 1.3 %
Eosinophil #: 0.4 10*3/uL (ref 0.0–0.7)
HGB: 11.7 g/dL — ABNORMAL LOW (ref 13.0–18.0)
Lymphocyte #: 0.8 10*3/uL — ABNORMAL LOW (ref 1.0–3.6)
MCHC: 34.1 g/dL (ref 32.0–36.0)
Monocyte #: 0.5 x10 3/mm (ref 0.2–1.0)
Monocyte %: 9.4 %
Neutrophil #: 3.6 10*3/uL (ref 1.4–6.5)
RBC: 3.6 10*6/uL — ABNORMAL LOW (ref 4.40–5.90)
RDW: 14.3 % (ref 11.5–14.5)
WBC: 5.3 10*3/uL (ref 3.8–10.6)

## 2013-10-12 ENCOUNTER — Ambulatory Visit (INDEPENDENT_AMBULATORY_CARE_PROVIDER_SITE_OTHER): Payer: Medicare Other

## 2013-10-12 ENCOUNTER — Ambulatory Visit (INDEPENDENT_AMBULATORY_CARE_PROVIDER_SITE_OTHER): Payer: Medicare Other | Admitting: General Practice

## 2013-10-12 DIAGNOSIS — I10 Essential (primary) hypertension: Secondary | ICD-10-CM

## 2013-10-12 DIAGNOSIS — I4891 Unspecified atrial fibrillation: Secondary | ICD-10-CM

## 2013-10-12 DIAGNOSIS — Z7901 Long term (current) use of anticoagulants: Secondary | ICD-10-CM

## 2013-10-12 DIAGNOSIS — R160 Hepatomegaly, not elsewhere classified: Secondary | ICD-10-CM

## 2013-10-13 LAB — HEPATIC FUNCTION PANEL: Bilirubin, Direct: 0.08 mg/dL (ref 0.00–0.40)

## 2013-10-25 ENCOUNTER — Ambulatory Visit: Payer: Self-pay | Admitting: Urology

## 2013-10-25 LAB — PROTIME-INR
INR: 1.1
Prothrombin Time: 13.9 secs (ref 11.5–14.7)

## 2013-10-26 LAB — PATHOLOGY REPORT

## 2013-11-01 ENCOUNTER — Telehealth: Payer: Self-pay | Admitting: *Deleted

## 2013-11-01 NOTE — Telephone Encounter (Signed)
Kidney procedure (biopsy) on 10/25/13 when does he need to start back on coumadin. Patient is still bleeding through his urine. Please advise 

## 2013-11-01 NOTE — Telephone Encounter (Signed)
Kidney procedure (biopsy) on 10/25/13 when does he need to start back on coumadin. Patient is still bleeding through his urine. Please advise

## 2013-11-01 NOTE — Telephone Encounter (Signed)
Left voice mail 12/23 

## 2013-11-01 NOTE — Telephone Encounter (Signed)
Resume Warfarin once he does not see blood in the urine anymore and after he checks with his urologist.

## 2013-11-02 NOTE — Telephone Encounter (Signed)
Informed patient that per Dr. Kirke Corin "Resume Warfarin once he does not see blood in the urine anymore and after he checks with his urologist"

## 2013-11-09 ENCOUNTER — Ambulatory Visit (INDEPENDENT_AMBULATORY_CARE_PROVIDER_SITE_OTHER): Payer: Medicare Other

## 2013-11-09 DIAGNOSIS — Z7901 Long term (current) use of anticoagulants: Secondary | ICD-10-CM

## 2013-11-09 DIAGNOSIS — I4891 Unspecified atrial fibrillation: Secondary | ICD-10-CM

## 2013-11-09 LAB — POCT INR: INR: 1.3

## 2013-11-10 HISTORY — PX: CATARACT EXTRACTION, BILATERAL: SHX1313

## 2013-11-16 ENCOUNTER — Ambulatory Visit (INDEPENDENT_AMBULATORY_CARE_PROVIDER_SITE_OTHER): Payer: Medicare PPO

## 2013-11-16 DIAGNOSIS — Z7901 Long term (current) use of anticoagulants: Secondary | ICD-10-CM

## 2013-11-16 DIAGNOSIS — I4891 Unspecified atrial fibrillation: Secondary | ICD-10-CM

## 2013-11-16 LAB — POCT INR: INR: 1.9

## 2013-11-24 ENCOUNTER — Encounter: Payer: Self-pay | Admitting: Cardiovascular Disease

## 2013-11-24 ENCOUNTER — Ambulatory Visit (INDEPENDENT_AMBULATORY_CARE_PROVIDER_SITE_OTHER): Payer: Medicare PPO | Admitting: Cardiovascular Disease

## 2013-11-24 VITALS — BP 127/89 | HR 88 | Ht 64.0 in | Wt 135.2 lb

## 2013-11-24 DIAGNOSIS — E785 Hyperlipidemia, unspecified: Secondary | ICD-10-CM

## 2013-11-24 DIAGNOSIS — I10 Essential (primary) hypertension: Secondary | ICD-10-CM

## 2013-11-24 DIAGNOSIS — I4891 Unspecified atrial fibrillation: Secondary | ICD-10-CM

## 2013-11-24 DIAGNOSIS — I2510 Atherosclerotic heart disease of native coronary artery without angina pectoris: Secondary | ICD-10-CM

## 2013-11-24 NOTE — Progress Notes (Signed)
HPI  This is a 73 year old male who is here today for a followup visit. He has known history of coronary artery disease status post four-vessel CABG in 1997 at [facility], persistent atrial fibrillation maintained in sinus rhythm on amiodarone, COPD with tobacco use, hyperlipidemia, hypertension, carotid artery disease status post left carotid endarterectomy, type 2 diabetes and GI bleed in March of 2014 with supratherapeutic INR (8).  Most recent echocardiogram was done in March of 2014 which showed an ejection fraction of 50-55%, moderately dilated left atrium, mild to moderate MR and moderate pulmonary hypertension. He was hospitalized in March of 2014 with GI bleed and hemoglobin of 5.8. His INR was 8 on presentation. EGD showed no obvious source of bleeding. He was subsequently evaluated by Dr. Deatra Ina. Colonoscopy showed AVMs in the cecum which were cauterized. He was placed back on warfarin. 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 He  underwent laser ablation on June 25. Warfarin was resumed with no recurrent GI bleed. He developed hematuria and was diagnosed with bladder cancer. He underwent resection. The details of these are not available. His warfarin was on hold but then resumed after the hematuria resolved. He has been doing well from a cardiac standpoint and denies chest pain or dyspnea. He exercises on the treadmill 30 minutes a day.  No Known Allergies   Current Outpatient Prescriptions on File Prior to Visit  Medication Sig Dispense Refill  . albuterol (PROVENTIL) (2.5 MG/3ML) 0.083% nebulizer solution Take 2.5 mg by nebulization every 4 (four) hours as needed for shortness of breath.       . allopurinol (ZYLOPRIM) 300 MG tablet Take 150 mg by mouth every morning.       Marland Kitchen aspirin EC 81 MG tablet Take 81 mg by mouth at bedtime.      Marland Kitchen diltiazem (DILACOR XR) 120 MG 24 hr capsule Take 1 capsule (120 mg total) by mouth every morning.  90 capsule  3  . ferrous sulfate 325 (65 FE) MG  tablet Take 325 mg by mouth daily with breakfast.      . formoterol (FORADIL) 12 MCG capsule for inhaler Place 12 mcg into inhaler and inhale 2 (two) times daily as needed for wheezing.       . furosemide (LASIX) 20 MG tablet Take 20 mg by mouth daily.       Marland Kitchen losartan (COZAAR) 50 MG tablet Take 0.5 tablets (25 mg total) by mouth every morning.  45 tablet  3  . omeprazole (PRILOSEC) 20 MG capsule Take 20 mg by mouth every morning.       . simvastatin (ZOCOR) 20 MG tablet Take 10 mg by mouth at bedtime.       Marland Kitchen tiotropium (SPIRIVA) 18 MCG inhalation capsule Place 18 mcg into inhaler and inhale daily after supper.        No current facility-administered medications on file prior to visit.     Past Medical History  Diagnosis Date  . Coronary heart disease   . Hyperlipidemia   . HTN (hypertension)   . COPD (chronic obstructive pulmonary disease)   . PVD (peripheral vascular disease)   . Atrial fibrillation     converted to NSR 5/09 on pacerone  . Shortness of breath     on exertion  . Diabetes mellitus without complication   . Arthritis     fingers  . Anemia   . DJD (degenerative joint disease) of hip   . GI bleed  01/2013 while on warfarin with a hemoglobin of 5.8 (INR was 8)  . CHF (congestive heart failure)     EF 50-55%  . Carotid artery occlusion   . Complication of anesthesia   . PONV (postoperative nausea and vomiting)   . Cataracts, bilateral     Hx: of  . Heart murmur   . Pneumonia     Hx: of     Past Surgical History  Procedure Laterality Date  . Tonsillectomy      as child  . Colonoscopy N/A 02/23/2013    Procedure: COLONOSCOPY;  Surgeon: Inda Castle, MD;  Location: [facility];  Service: Endoscopy;  Laterality: N/A;  . Esophagogastroduodenoscopy N/A 02/23/2013    Procedure: ESOPHAGOGASTRODUODENOSCOPY (EGD);  Surgeon: Inda Castle, MD;  Location: [facility];  Service: Endoscopy;  Laterality: N/A;  . Hot hemostasis N/A 05/04/2013    Procedure: HOT  HEMOSTASIS (ARGON PLASMA COAGULATION/BICAP);  Surgeon: Inda Castle, MD;  Location: [facility];  Service: Endoscopy;  Laterality: N/A;  . Enteroscopy N/A 05/04/2013    Procedure: ENTEROSCOPY;  Surgeon: Inda Castle, MD;  Location: [facility];  Service: Endoscopy;  Laterality: N/A;  . Bypass graft  1997 ?  Marland Kitchen Coronary artery bypass graft      15-17 yrs  . Joint replacement Right      Hip 10-12 yrs  . Cardiac catheterization    . Endarterectomy Left 06/15/2013    Procedure: ENDARTERECTOMY CAROTID- LEFT;  Surgeon: Conrad Bagtown, MD;  Location: [facility];  Service: Vascular;  Laterality: Left;  . Patch angioplasty Left 06/15/2013    Procedure: PATCH ANGIOPLASTY using Vascu-Guard Vascular Patch;  Surgeon: Conrad Nanwalek, MD;  Location: [facility];  Service: Vascular;  Laterality: Left;  . Carotid endarterectomy       Family History  Problem Relation Age of Onset  . Emphysema Father   . Heart disease Father   . Cancer Mother     unknwn type  . Tuberculosis Paternal Grandfather   . Peripheral vascular disease Sister   . Hyperlipidemia Brother      History   Social History  . Marital Status: Married    Spouse Name: N/A    Number of Children: 3  . Years of Education: N/A   Occupational History  . retired- worked in a print shop x35 yrs    Social History Main Topics  . Smoking status: Current Every Day Smoker -- 0.25 packs/day    Types: Cigarettes  . Smokeless tobacco: Never Used     Comment: Started smoking at age 34.    Marland Kitchen Alcohol Use: No     Comment: Beer -haven't drank since Dec 2013  . Drug Use: No  . Sexual Activity: Not on file   Other Topics Concern  . Not on file   Social History Narrative  . No narrative on file      PHYSICAL EXAM   BP 127/89  Pulse 88  Ht 5\' 4"  (1.626 m)  Wt 135 lb 4 oz (61.349 kg)  BMI 23.20 kg/m2 Constitutional: He is oriented to person, place, and time. He appears well-developed and well-nourished. No distress.  HENT: No nasal discharge.   Head: Normocephalic and atraumatic.  Eyes: Pupils are equal and round.  Neck: Normal range of motion. Neck supple. No JVD present. No thyromegaly present. There is a faint left carotid bruit. Cardiovascular: Normal rate, regular rhythm, normal heart sounds. Exam reveals no gallop and no friction rub. There is a 2/6  systolic murmur at the left sternal border. Pulmonary/Chest: Effort normal and breath sounds normal. No stridor. No respiratory distress. He has no wheezes. He has no rales. He exhibits no tenderness.  Abdominal: Soft. Bowel sounds are normal. He exhibits no distension. There is no tenderness. There is no rebound and no guarding.  Musculoskeletal: Normal range of motion. He exhibits no edema and no tenderness.  Neurological: He is alert and oriented to person, place, and time. Coordination normal.  Skin: Skin is warm and dry. No rash noted. He is not diaphoretic. No erythema. No pallor.  Psychiatric: He has a normal mood and affect. His behavior is normal. Judgment and thought content normal.       EKG: Sinus  Rhythm  -First degree A-V block  PRi = 266 -Nonspecific ST changes  ABNORMAL    ASSESSMENT AND PLAN

## 2013-11-24 NOTE — Patient Instructions (Signed)
Your physician wants you to follow-up in: 6 months. You will receive a reminder letter in the mail two months in advance. If you don't receive a letter, please call our office to schedule the follow-up appointment.  Your physician recommends that you have lab work today: Liver panel   Your physician recommends that you continue on your current medications as directed. Please refer to the Current Medication list given to you today.

## 2013-11-25 LAB — HEPATIC FUNCTION PANEL
ALK PHOS: 109 IU/L (ref 39–117)
ALT: 61 IU/L — AB (ref 0–44)
AST: 78 IU/L — ABNORMAL HIGH (ref 0–40)
Albumin: 3.9 g/dL (ref 3.5–4.8)
BILIRUBIN DIRECT: 0.15 mg/dL (ref 0.00–0.40)
TOTAL PROTEIN: 6.2 g/dL (ref 6.0–8.5)
Total Bilirubin: 0.4 mg/dL (ref 0.0–1.2)

## 2013-11-25 MED ORDER — SIMVASTATIN 10 MG PO TABS: 10.0000 mg | ORAL_TABLET | Freq: Every day | ORAL | Status: DC

## 2013-11-28 ENCOUNTER — Encounter: Payer: Self-pay | Admitting: Cardiovascular Disease

## 2013-11-28 ENCOUNTER — Other Ambulatory Visit: Payer: Self-pay | Admitting: *Deleted

## 2013-11-28 NOTE — Telephone Encounter (Signed)
Stop Simvastatin and start ATorvastatin 10 mg once daily.

## 2013-11-28 NOTE — Telephone Encounter (Signed)
Please call Jason Larsen back. He returned you call from Friday re: medications.

## 2013-11-28 NOTE — Assessment & Plan Note (Signed)
He is currently on simvastatin 20 mg once daily. He might have to decrease the dose if his liver function tests are still abnormal. The other option would be to switch to a different statin especially that he is on diltiazem.

## 2013-11-28 NOTE — Assessment & Plan Note (Signed)
Blood pressure is well controlled on current medications. 

## 2013-11-28 NOTE — Telephone Encounter (Signed)
Spoke w/ pt.  He reports that he was called on Friday w/ lab results and given instructions to decrease simvastatin to 10mg  daily. Pt reports that he has been on 10mg  for some time and would like to know if he should continue on this same dose or if any changes need to be made.  Please advise.  Thank you!

## 2013-11-28 NOTE — Assessment & Plan Note (Signed)
He has no symptoms suggestive of angina and has been active physically. Continue medical therapy.

## 2013-11-28 NOTE — Assessment & Plan Note (Signed)
He is maintaining in sinus rhythm on amiodarone. The dose was decreased during last visit to 200 mg once daily. His liver enzymes were mildly elevated at that time. Repeat liver profile today. He is also on a statin which might have to be adjusted. Continue anticoagulation with warfarin.

## 2013-11-29 MED ORDER — ATORVASTATIN CALCIUM 10 MG PO TABS: 10.0000 mg | ORAL_TABLET | Freq: Every day | ORAL | Status: DC

## 2013-11-29 NOTE — Telephone Encounter (Signed)
lvm 1/20

## 2013-11-29 NOTE — Telephone Encounter (Signed)
Informed patient and patients wife of med change ordered by Dr. Fletcher Anon. Patient verbalized understanding. Simvastatin stopped. Started on atorvastatin 10 mg once daily

## 2013-11-30 ENCOUNTER — Ambulatory Visit (INDEPENDENT_AMBULATORY_CARE_PROVIDER_SITE_OTHER): Payer: Medicare PPO

## 2013-11-30 DIAGNOSIS — Z7901 Long term (current) use of anticoagulants: Secondary | ICD-10-CM

## 2013-11-30 DIAGNOSIS — I4891 Unspecified atrial fibrillation: Secondary | ICD-10-CM

## 2013-11-30 LAB — POCT INR: INR: 1.6

## 2013-12-11 DIAGNOSIS — C4490 Unspecified malignant neoplasm of skin, unspecified: Secondary | ICD-10-CM

## 2013-12-11 HISTORY — DX: Unspecified malignant neoplasm of skin, unspecified: C44.90

## 2013-12-11 HISTORY — PX: SKIN CANCER EXCISION: SHX779

## 2013-12-14 ENCOUNTER — Ambulatory Visit (INDEPENDENT_AMBULATORY_CARE_PROVIDER_SITE_OTHER): Payer: Medicare PPO | Admitting: *Deleted

## 2013-12-14 DIAGNOSIS — Z7901 Long term (current) use of anticoagulants: Secondary | ICD-10-CM

## 2013-12-14 DIAGNOSIS — Z5181 Encounter for therapeutic drug level monitoring: Secondary | ICD-10-CM

## 2013-12-14 DIAGNOSIS — I4891 Unspecified atrial fibrillation: Secondary | ICD-10-CM

## 2013-12-14 LAB — POCT INR: INR: 2.6

## 2013-12-29 ENCOUNTER — Encounter: Payer: Self-pay | Admitting: Vascular Surgery

## 2013-12-30 ENCOUNTER — Encounter: Payer: Self-pay | Admitting: Vascular Surgery

## 2013-12-30 ENCOUNTER — Ambulatory Visit (HOSPITAL_COMMUNITY)
Admission: RE | Admit: 2013-12-30 | Discharge: 2013-12-30 | Disposition: A | Discharge status: Home / Self Care | Payer: Medicare PPO | Source: Ambulatory Visit | Attending: Vascular Surgery | Admitting: Vascular Surgery

## 2013-12-30 ENCOUNTER — Ambulatory Visit (INDEPENDENT_AMBULATORY_CARE_PROVIDER_SITE_OTHER): Payer: Medicare PPO | Admitting: Vascular Surgery

## 2013-12-30 VITALS — BP 118/61 | HR 85 | Ht 64.0 in | Wt 135.0 lb

## 2013-12-30 DIAGNOSIS — I6529 Occlusion and stenosis of unspecified carotid artery: Secondary | ICD-10-CM

## 2013-12-30 DIAGNOSIS — Z48812 Encounter for surgical aftercare following surgery on the circulatory system: Secondary | ICD-10-CM

## 2013-12-30 DIAGNOSIS — Z9889 Other specified postprocedural states: Secondary | ICD-10-CM | POA: Insufficient documentation

## 2013-12-30 NOTE — Progress Notes (Signed)
    Established Carotid Patient  History of Present Illness  Jason Larsen is a 73 y.o. ([id number]) male who presents with chief complaint: left jawline numbness.  Pt underwent L CEA on 02/15/53 for asx LICA stenosis >62%.  He has had some left jawline numbness since the surgery.  The patient has had no TIA or CVA sx.  He does not feel the jawline numbness affects ADL.    The patient's PMH, PSH, SH, FamHx, Med, and Allergies are unchanged from 07/01/13.  On ROS today: no TIA or CVA sx, no difficulties swallowing  Physical Examination  Filed Vitals:   12/30/13 1119 12/30/13 1120  BP: 116/64 118/61  Pulse: 85   Height: 5\' 4"  (1.626 m)   Weight: 135 lb (61.236 kg)   SpO2: 100%    Body mass index is 23.16 kg/(m^2).  General: A&O x 3, WDWN  Eyes: PERRLA, EOMI  Neck: Supple, no nuchal rigidity, no palpable LAD, well healed incision  Pulmonary: Sym exp, good air movt, CTAB, no rales, rhonchi, & wheezing  Cardiac: RRR, Nl S1, S2, no Murmurs, rubs or gallops  Vascular: Vessel Right Left  Radial Palpable Palpable  Brachial Palpable Palpable  Carotid Palpable, without bruit Palpable, without bruit  Aorta Not palpable N/A  Femoral Palpable Palpable  Popliteal Not palpable Not palpable  PT Palpable Not Palpable  DP Palpable Not Palpable   Gastrointestinal: soft, NTND, -G/R, - HSM, - masses, - CVAT B  Musculoskeletal: M/S 5/5 throughout , Extremities without ischemic changes   Neurologic: CN 2-12 intact , Pain and light touch intact in extremities  except L jawline: decreased sensation, Motor exam as listed above  Non-Invasive Vascular Imaging  CAROTID DUPLEX (Date: 12/30/2013):   R ICA stenosis: <40%  R VA: patent and antegrade  L ICA stenosis: patent CEA  L VA: patent and antegrade  Medical Decision Making  Jason Larsen is a 73 y.o. male who presents with: asx R ICA stenosis <40%, s/p L CEA   Based on the patient's vascular studies and examination, I have offered  the patient: repeat B carotid duplex in 6 month.  If result unchanged, then annual B carotid duplex  I discussed in depth with the patient the nature of atherosclerosis, and emphasized the importance of maximal medical management including strict control of blood pressure, blood glucose, and lipid levels, antiplatelet agents, obtaining regular exercise, and cessation of smoking.    The patient is aware that without maximal medical management the underlying atherosclerotic disease process will progress, limiting the benefit of any interventions. The patient is currently on a statin: Lipitor. The patient is currently on an anti-platelet: ASA. He is also on Coumadin.  Thank you for allowing [place] to participate in this patient's care.  Adele Barthel, MD [facility] Office: [phone] Pager: [phone]  12/30/2013, 12:52 PM

## 2013-12-30 NOTE — Addendum Note (Signed)
Addended by: Dorthula Rue L on: 12/30/2013 04:54 PM   Modules accepted: Orders

## 2014-01-06 ENCOUNTER — Other Ambulatory Visit (HOSPITAL_COMMUNITY): Payer: Medicare Other

## 2014-01-06 ENCOUNTER — Ambulatory Visit: Payer: Medicare Other | Admitting: Vascular Surgery

## 2014-01-10 ENCOUNTER — Telehealth: Payer: Self-pay | Admitting: Vascular Surgery

## 2014-01-10 NOTE — Telephone Encounter (Addendum)
Message copied by Gena Fray on Tue Jan 10, 2014 10:58 AM ------      Message from: Conrad [place]      Created: Mon Jan 09, 2014 12:00 PM      Regarding: RE: Follow up ?       6 months.  He has to follow up for [facility] registry purposes            ----- Message -----         From: Gena Fray         Sent: 01/09/2014  11:49 AM           To: Conrad [place], MD      Subject: Follow up ?                                              Dr Bridgett Larsson,            Mr Pribyl called today to inquire about his next follow up. We scheduled him for a 6 month carotid duplex and to see you as per the blue discharge form. You dictation also indicated 6 month follow up. However, the patient was under the impression that he would follow up in 1 year.  Could you please clarify the proper follow up?            Thanks,      Hinton Dyer       ------  01/10/14: spoke with pt and he agrees with plan, dpm

## 2014-01-11 ENCOUNTER — Telehealth: Payer: Self-pay | Admitting: Gastroenterology

## 2014-01-11 ENCOUNTER — Ambulatory Visit (INDEPENDENT_AMBULATORY_CARE_PROVIDER_SITE_OTHER): Payer: Commercial Managed Care - HMO

## 2014-01-11 ENCOUNTER — Telehealth: Payer: Self-pay | Admitting: *Deleted

## 2014-01-11 DIAGNOSIS — I4891 Unspecified atrial fibrillation: Secondary | ICD-10-CM

## 2014-01-11 DIAGNOSIS — Z5181 Encounter for therapeutic drug level monitoring: Secondary | ICD-10-CM

## 2014-01-11 DIAGNOSIS — Z7901 Long term (current) use of anticoagulants: Secondary | ICD-10-CM

## 2014-01-11 LAB — POCT INR: INR: 2.7

## 2014-01-11 NOTE — Telephone Encounter (Signed)
Pt states he has been having diarrhea and some abdominal pain for about 3 weeks. States the pain has gotten better but the diarrhea has continued. Pt scheduled to see Dr. Deatra Ina tomorrow at 9:30am. Pt aware of appt.

## 2014-01-11 NOTE — Telephone Encounter (Signed)
Please call Jason Larsen he wants to know if he should stop coumadin for a ear procedure

## 2014-01-11 NOTE — Telephone Encounter (Signed)
Ok to hold Warfarin 5 days before procedure with no bridging needed. Resume after.

## 2014-01-11 NOTE — Telephone Encounter (Signed)
Called spoke with pt's wife, advised per Dr Fletcher Anon ok to hold Coumadin 5 days prior to procedure on 01/17/14.  Advised to resume Coumadin after procedure once ok by dermatologist.  Pt's wife verbalized understanding.

## 2014-01-11 NOTE — Telephone Encounter (Signed)
Pt's wife spoke with Nurse practioner at dermatologist office and they are requesting pt hold Coumadin 5 days prior to procedure scheduled for 01/17/14.  Advised I will forward request to Dr Fletcher Anon to advise.  Will call pt's wife back with further instructions once cleared by Dr Fletcher Anon to hold Coumadin.  She verbalized understanding.

## 2014-01-11 NOTE — Telephone Encounter (Signed)
Called spoke with pt's wife pt is having dermatologist remove skin lesion from ear.  Inquiring about how long pt should hold Coumadin prior to procedure.  Advised pt's wife that is to be determined by his dermatologist depending on bleeding risk of procedure.  Advised her to call their office and inquire then call us back if he needs to hold Coumadin so that we can get approval from Dr Fletcher Anon for pt to hold Coumadin if necessary.  Pt's wife verbalized understanding.

## 2014-01-12 ENCOUNTER — Ambulatory Visit (INDEPENDENT_AMBULATORY_CARE_PROVIDER_SITE_OTHER): Payer: Medicare PPO | Admitting: Gastroenterology

## 2014-01-12 ENCOUNTER — Other Ambulatory Visit (INDEPENDENT_AMBULATORY_CARE_PROVIDER_SITE_OTHER): Payer: Medicare PPO

## 2014-01-12 ENCOUNTER — Encounter: Payer: Self-pay | Admitting: Gastroenterology

## 2014-01-12 VITALS — BP 120/60 | HR 80 | Ht 64.0 in | Wt 134.8 lb

## 2014-01-12 DIAGNOSIS — K648 Other hemorrhoids: Secondary | ICD-10-CM | POA: Insufficient documentation

## 2014-01-12 DIAGNOSIS — R197 Diarrhea, unspecified: Secondary | ICD-10-CM

## 2014-01-12 DIAGNOSIS — D649 Anemia, unspecified: Secondary | ICD-10-CM

## 2014-01-12 DIAGNOSIS — R160 Hepatomegaly, not elsewhere classified: Secondary | ICD-10-CM

## 2014-01-12 LAB — CBC WITH DIFFERENTIAL/PLATELET
BASOS ABS: 0 10*3/uL (ref 0.0–0.1)
Basophils Relative: 0.3 % (ref 0.0–3.0)
EOS ABS: 0.1 10*3/uL (ref 0.0–0.7)
Eosinophils Relative: 1.8 % (ref 0.0–5.0)
HCT: 37.3 % — ABNORMAL LOW (ref 39.0–52.0)
Hemoglobin: 12.8 g/dL — ABNORMAL LOW (ref 13.0–17.0)
LYMPHS ABS: 0.8 10*3/uL (ref 0.7–4.0)
Lymphocytes Relative: 16.8 % (ref 12.0–46.0)
MCHC: 34.3 g/dL (ref 30.0–36.0)
MCV: 95.9 fl (ref 78.0–100.0)
MONO ABS: 0.4 10*3/uL (ref 0.1–1.0)
Monocytes Relative: 9 % (ref 3.0–12.0)
NEUTROS PCT: 72.1 % (ref 43.0–77.0)
Neutro Abs: 3.6 10*3/uL (ref 1.4–7.7)
PLATELETS: 145 10*3/uL — AB (ref 150.0–400.0)
RBC: 3.89 Mil/uL — ABNORMAL LOW (ref 4.22–5.81)
RDW: 15.6 % — AB (ref 11.5–14.6)
WBC: 5 10*3/uL (ref 4.5–10.5)

## 2014-01-12 NOTE — Assessment & Plan Note (Signed)
He has  asymptomatic grade 3 hemorrhoids.  Recommendations #1 warm soaks and hemorrhoidal suppositories as needed

## 2014-01-12 NOTE — Patient Instructions (Signed)
Go to the basement for labs Use hemorrhoidal suppositories Over the counter as needed Use probiotic daily

## 2014-01-12 NOTE — Assessment & Plan Note (Addendum)
Ultrasound did not show any liver abnormalities.  Liver size normal.  There was some nodularity in the gallbladder raising the question of adenomyomatosis.  Followup scan was recommended.  Recommendations #1 ultrasound

## 2014-01-12 NOTE — Assessment & Plan Note (Signed)
Diarrhea is probably a postinfectious diarrhea secondary to a viral infection.  Symptoms seem to be slowly improving.    Recommendations #1 stool pathogen panel #2 two-week course of fluora Q #3 Imodium as needed

## 2014-01-12 NOTE — Addendum Note (Signed)
Addended by: Oda Kilts on: 01/12/2014 10:20 AM   Modules accepted: Orders

## 2014-01-12 NOTE — Assessment & Plan Note (Signed)
Plan repeat CBC

## 2014-01-12 NOTE — Progress Notes (Signed)
          History of Present Illness:  The patient has returned for evaluation of diarrhea.  Approximately 3 weeks ago he developed what sounds to diarrheal illness.  Since that time he's had intermittent diarrhea.  He may go a day with normal bowel movements followed by a day where he has multiple diarrheal stools.  He believes his son has similar illness.  He's been on no antibiotics and has no change in medications.  He denies rectal bleeding.  He's had a protruding hemorrhoid.    Review of Systems: Pertinent positive and negative review of systems were noted in the above HPI section. All other review of systems were otherwise negative.    Current Medications, Allergies, Past Medical History, Past Surgical History, Family History and Social History were reviewed in [facility] record  Vital signs were reviewed in today's medical record. Physical Exam: General: Well developed , well nourished, no acute distress Skin: anicteric Head: Normocephalic and atraumatic Eyes:  sclerae anicteric, EOMI Ears: Normal auditory acuity Mouth: No deformity or lesions Lungs: There are a few expiratory wheezes Heart: Regular rate and rhythm; no rubs or bruits.  There is a 2/6 early systolic murmur Abdomen: Soft, non tender and non distended. No masses, hepatosplenomegaly or hernias noted. Normal Bowel sounds Rectal:deferred Musculoskeletal: Symmetrical with no gross deformities  Pulses:  Normal pulses noted Extremities: No clubbing, cyanosis, edema or deformities noted Neurological: Alert oriented x 4, grossly nonfocal Psychological:  Alert and cooperative. Normal mood and affect  See Assessment and Plan under Problem List

## 2014-01-17 ENCOUNTER — Ambulatory Visit: Payer: Self-pay | Admitting: Ophthalmology

## 2014-01-17 ENCOUNTER — Other Ambulatory Visit: Payer: Commercial Managed Care - HMO

## 2014-01-17 DIAGNOSIS — R197 Diarrhea, unspecified: Secondary | ICD-10-CM

## 2014-01-17 LAB — HEMOGLOBIN: HGB: 12.3 g/dL — AB (ref 13.0–18.0)

## 2014-01-18 LAB — GASTROINTESTINAL PATHOGEN PANEL PCR
C. DIFFICILE TOX A/B, PCR: NEGATIVE
Campylobacter, PCR: NEGATIVE
Cryptosporidium, PCR: NEGATIVE
E coli (ETEC) LT/ST PCR: NEGATIVE
E coli (STEC) stx1/stx2, PCR: NEGATIVE
E coli 0157, PCR: NEGATIVE
GIARDIA LAMBLIA, PCR: NEGATIVE
NOROVIRUS, PCR: NEGATIVE
Rotavirus A, PCR: POSITIVE — CR
Salmonella, PCR: NEGATIVE
Shigella, PCR: NEGATIVE

## 2014-01-19 ENCOUNTER — Telehealth: Payer: Self-pay | Admitting: *Deleted

## 2014-01-19 NOTE — Telephone Encounter (Signed)
Faxed surgery clearance form to [facility] 01/19/14

## 2014-01-19 NOTE — Telephone Encounter (Signed)
Cindy with [facility] called to make sure we received the clearance form for his eye surgery.   Informed her that it is in Dr Jacklynn Ganong box and will be sent tomorrow or Monday for his procedure on 01/24/14.

## 2014-01-24 ENCOUNTER — Ambulatory Visit: Payer: Self-pay | Admitting: Ophthalmology

## 2014-01-30 ENCOUNTER — Telehealth: Payer: Self-pay | Admitting: Gastroenterology

## 2014-01-30 NOTE — Telephone Encounter (Signed)
Will you help me with this Vaughan Basta...  I am looking for something that tells me Dr Deatra Ina prescribed this inhaler. Do you see anything that im not because I cant and he is not here

## 2014-01-30 NOTE — Telephone Encounter (Signed)
I don't see anything either. I would think that pulmonary Dr. Joya Gaskins would have given him this.

## 2014-01-30 NOTE — Telephone Encounter (Signed)
The  Medication was wrote down wrong   It was suppose to [place] Q   I asked pts wife if the patient still has diarrhea  She stated no, So I told her he could stop the Flora q Probiotic as long as  He does not have diarrhea

## 2014-02-08 ENCOUNTER — Ambulatory Visit (INDEPENDENT_AMBULATORY_CARE_PROVIDER_SITE_OTHER): Payer: Medicare PPO

## 2014-02-08 DIAGNOSIS — Z7901 Long term (current) use of anticoagulants: Secondary | ICD-10-CM

## 2014-02-08 DIAGNOSIS — Z5181 Encounter for therapeutic drug level monitoring: Secondary | ICD-10-CM

## 2014-02-08 DIAGNOSIS — I4891 Unspecified atrial fibrillation: Secondary | ICD-10-CM

## 2014-02-08 LAB — POCT INR: INR: 3.2

## 2014-02-13 ENCOUNTER — Telehealth: Payer: Self-pay

## 2014-02-13 NOTE — Telephone Encounter (Signed)
Message copied by Theophilus Kinds on Mon Feb 13, 2014  9:48 AM ------      Message from: Kathlyn Sacramento A      Created: Fri Feb 10, 2014  8:49 AM       Yes. That is fine. No need to bridge.             ----- Message -----         From: Flossie Dibble, RN         Sent: 02/08/2014  11:11 AM           To: Wellington Hampshire, MD            Pt is scheduled for bladder procedure on 02/21/14 with Dr [name], instructed to hold Coumadin x 4 days prior to procedure.  Please advise if OK to hold Coumadin.  Thanks        ------

## 2014-02-13 NOTE — Telephone Encounter (Signed)
Advised pt OK to hold Coumadin 4 days prior to bladder procedure on 02/21/14, without Lovenox bridge.  Advised pt to resume Coumadin after procedure once cleared to do so by urologist.

## 2014-02-14 ENCOUNTER — Ambulatory Visit: Payer: Self-pay | Admitting: Urology

## 2014-02-14 LAB — CBC WITH DIFFERENTIAL/PLATELET
BASOS ABS: 0 10*3/uL (ref 0.0–0.1)
BASOS PCT: 1 %
EOS ABS: 0 10*3/uL (ref 0.0–0.7)
Eosinophil %: 0.4 %
HCT: 37.1 % — ABNORMAL LOW (ref 40.0–52.0)
HGB: 12.4 g/dL — ABNORMAL LOW (ref 13.0–18.0)
Lymphocyte #: 0.6 10*3/uL — ABNORMAL LOW (ref 1.0–3.6)
Lymphocyte %: 14.2 %
MCH: 33.1 pg (ref 26.0–34.0)
MCHC: 33.5 g/dL (ref 32.0–36.0)
MCV: 99 fL (ref 80–100)
Monocyte #: 0.4 x10 3/mm (ref 0.2–1.0)
Monocyte %: 8.6 %
Neutrophil #: 3.4 10*3/uL (ref 1.4–6.5)
Neutrophil %: 75.8 %
Platelet: 119 10*3/uL — ABNORMAL LOW (ref 150–440)
RBC: 3.76 10*6/uL — AB (ref 4.40–5.90)
RDW: 14.4 % (ref 11.5–14.5)
WBC: 4.5 10*3/uL (ref 3.8–10.6)

## 2014-02-14 LAB — BASIC METABOLIC PANEL
Anion Gap: 2 — ABNORMAL LOW (ref 7–16)
BUN: 19 mg/dL — AB (ref 7–18)
CALCIUM: 8.5 mg/dL (ref 8.5–10.1)
CHLORIDE: 107 mmol/L (ref 98–107)
Co2: 32 mmol/L (ref 21–32)
Creatinine: 0.88 mg/dL (ref 0.60–1.30)
EGFR (African American): >60
EGFR (Non-African Amer.): >60
GLUCOSE: 92 mg/dL (ref 65–99)
OSMOLALITY: 283 (ref 275–301)
Potassium: 3.9 mmol/L (ref 3.5–5.1)
Sodium: 141 mmol/L (ref 136–145)

## 2014-02-14 LAB — PROTIME-INR
INR: 2
PROTHROMBIN TIME: 22 s — AB (ref 11.5–14.7)

## 2014-02-14 LAB — APTT: Activated PTT: 46.3 secs — ABNORMAL HIGH (ref 23.6–35.9)

## 2014-02-20 ENCOUNTER — Ambulatory Visit (INDEPENDENT_AMBULATORY_CARE_PROVIDER_SITE_OTHER): Payer: Medicare PPO | Admitting: *Deleted

## 2014-02-20 ENCOUNTER — Ambulatory Visit: Payer: Self-pay | Admitting: Ophthalmology

## 2014-02-20 DIAGNOSIS — I4891 Unspecified atrial fibrillation: Secondary | ICD-10-CM

## 2014-02-20 LAB — HEMOGLOBIN: HGB: 12.7 g/dL — ABNORMAL LOW (ref 13.0–18.0)

## 2014-02-21 ENCOUNTER — Ambulatory Visit: Payer: Self-pay | Admitting: Urology

## 2014-02-21 ENCOUNTER — Other Ambulatory Visit: Payer: Medicare PPO

## 2014-02-21 LAB — HEPATIC FUNCTION PANEL
ALT: 43 IU/L (ref 0–44)
AST: 60 IU/L — ABNORMAL HIGH (ref 0–40)
Albumin: 3.8 g/dL (ref 3.5–4.8)
Alkaline Phosphatase: 140 IU/L — ABNORMAL HIGH (ref 39–117)
Bilirubin, Direct: 0.24 mg/dL (ref 0.00–0.40)
TOTAL PROTEIN: 6.2 g/dL (ref 6.0–8.5)
Total Bilirubin: 0.7 mg/dL (ref 0.0–1.2)

## 2014-02-21 LAB — PROTIME-INR
INR: 1.2
PROTHROMBIN TIME: 14.9 s — AB (ref 11.5–14.7)

## 2014-02-22 LAB — PATHOLOGY REPORT

## 2014-02-28 ENCOUNTER — Ambulatory Visit: Payer: Self-pay | Admitting: Ophthalmology

## 2014-03-01 ENCOUNTER — Ambulatory Visit (INDEPENDENT_AMBULATORY_CARE_PROVIDER_SITE_OTHER): Payer: Medicare PPO

## 2014-03-01 DIAGNOSIS — Z7901 Long term (current) use of anticoagulants: Secondary | ICD-10-CM

## 2014-03-01 DIAGNOSIS — Z5181 Encounter for therapeutic drug level monitoring: Secondary | ICD-10-CM

## 2014-03-01 DIAGNOSIS — I4891 Unspecified atrial fibrillation: Secondary | ICD-10-CM

## 2014-03-01 LAB — POCT INR: INR: 1.6

## 2014-03-10 ENCOUNTER — Telehealth: Payer: Self-pay | Admitting: *Deleted

## 2014-03-10 NOTE — Telephone Encounter (Signed)
Pt is having lip biopsy on 03/20/14. When does he need to stop his coumadin?

## 2014-03-10 NOTE — Telephone Encounter (Signed)
5 days before biopsy.

## 2014-03-10 NOTE — Telephone Encounter (Signed)
Informed patient of Dr. Jacklynn Ganong response to stop coumadin 5 days before his procedure  Patient verbalized understanding

## 2014-03-10 NOTE — Telephone Encounter (Signed)
Patient is having a biopsy on his lip on 03/20/14 and wants to know if he can stop his coumadin. Please call patient.

## 2014-03-15 ENCOUNTER — Ambulatory Visit (INDEPENDENT_AMBULATORY_CARE_PROVIDER_SITE_OTHER): Payer: Commercial Managed Care - HMO

## 2014-03-15 DIAGNOSIS — Z5181 Encounter for therapeutic drug level monitoring: Secondary | ICD-10-CM

## 2014-03-15 DIAGNOSIS — Z7901 Long term (current) use of anticoagulants: Secondary | ICD-10-CM

## 2014-03-15 DIAGNOSIS — I4891 Unspecified atrial fibrillation: Secondary | ICD-10-CM

## 2014-03-15 LAB — POCT INR: INR: 1.6

## 2014-03-29 ENCOUNTER — Ambulatory Visit (INDEPENDENT_AMBULATORY_CARE_PROVIDER_SITE_OTHER): Payer: Medicare PPO

## 2014-03-29 DIAGNOSIS — Z7901 Long term (current) use of anticoagulants: Secondary | ICD-10-CM

## 2014-03-29 DIAGNOSIS — I4891 Unspecified atrial fibrillation: Secondary | ICD-10-CM

## 2014-03-29 DIAGNOSIS — Z5181 Encounter for therapeutic drug level monitoring: Secondary | ICD-10-CM

## 2014-03-29 LAB — POCT INR: INR: 1.6

## 2014-04-12 ENCOUNTER — Ambulatory Visit (INDEPENDENT_AMBULATORY_CARE_PROVIDER_SITE_OTHER): Payer: Commercial Managed Care - HMO

## 2014-04-12 DIAGNOSIS — Z5181 Encounter for therapeutic drug level monitoring: Secondary | ICD-10-CM

## 2014-04-12 DIAGNOSIS — I4891 Unspecified atrial fibrillation: Secondary | ICD-10-CM

## 2014-04-12 DIAGNOSIS — Z7901 Long term (current) use of anticoagulants: Secondary | ICD-10-CM

## 2014-04-12 LAB — POCT INR: INR: 1.9

## 2014-04-26 ENCOUNTER — Ambulatory Visit (INDEPENDENT_AMBULATORY_CARE_PROVIDER_SITE_OTHER): Payer: Commercial Managed Care - HMO

## 2014-04-26 DIAGNOSIS — I4891 Unspecified atrial fibrillation: Secondary | ICD-10-CM

## 2014-04-26 DIAGNOSIS — Z7901 Long term (current) use of anticoagulants: Secondary | ICD-10-CM

## 2014-04-26 DIAGNOSIS — Z5181 Encounter for therapeutic drug level monitoring: Secondary | ICD-10-CM

## 2014-04-26 LAB — POCT INR: INR: 2.7

## 2014-05-15 ENCOUNTER — Other Ambulatory Visit: Payer: Self-pay | Admitting: *Deleted

## 2014-05-15 MED ORDER — LOSARTAN POTASSIUM 25 MG PO TABS: 25.0000 mg | ORAL_TABLET | Freq: Every day | ORAL | Status: DC

## 2014-05-15 NOTE — Telephone Encounter (Signed)
Patient wants a 90 day supply.

## 2014-05-17 ENCOUNTER — Ambulatory Visit (INDEPENDENT_AMBULATORY_CARE_PROVIDER_SITE_OTHER): Payer: Commercial Managed Care - HMO

## 2014-05-17 DIAGNOSIS — Z5181 Encounter for therapeutic drug level monitoring: Secondary | ICD-10-CM

## 2014-05-17 DIAGNOSIS — Z7901 Long term (current) use of anticoagulants: Secondary | ICD-10-CM | POA: Diagnosis not present

## 2014-05-17 DIAGNOSIS — I4891 Unspecified atrial fibrillation: Secondary | ICD-10-CM | POA: Diagnosis not present

## 2014-05-17 LAB — POCT INR: INR: 2.7

## 2014-06-08 ENCOUNTER — Ambulatory Visit (INDEPENDENT_AMBULATORY_CARE_PROVIDER_SITE_OTHER): Payer: Commercial Managed Care - HMO | Admitting: Cardiovascular Disease

## 2014-06-08 ENCOUNTER — Encounter: Payer: Self-pay | Admitting: Cardiovascular Disease

## 2014-06-08 VITALS — BP 98/52 | HR 91 | Ht 64.0 in | Wt 134.2 lb

## 2014-06-08 DIAGNOSIS — I4819 Other persistent atrial fibrillation: Secondary | ICD-10-CM

## 2014-06-08 DIAGNOSIS — I4891 Unspecified atrial fibrillation: Secondary | ICD-10-CM

## 2014-06-08 DIAGNOSIS — E785 Hyperlipidemia, unspecified: Secondary | ICD-10-CM

## 2014-06-08 DIAGNOSIS — R0602 Shortness of breath: Secondary | ICD-10-CM

## 2014-06-08 DIAGNOSIS — I10 Essential (primary) hypertension: Secondary | ICD-10-CM

## 2014-06-08 DIAGNOSIS — I2510 Atherosclerotic heart disease of native coronary artery without angina pectoris: Secondary | ICD-10-CM

## 2014-06-08 MED ORDER — AMIODARONE HCL 100 MG PO TABS: 100.0000 mg | ORAL_TABLET | Freq: Every day | ORAL | Status: DC

## 2014-06-08 NOTE — Assessment & Plan Note (Signed)
I switched him from simvastatin to atorvastatin given interaction with diltiazem. I will obtain a fasting lipid and liver profile With his next visit.

## 2014-06-08 NOTE — Assessment & Plan Note (Signed)
He has no symptoms of angina. Continue medical therapy. 

## 2014-06-08 NOTE — Patient Instructions (Signed)
Your physician has recommended you make the following change in your medication:  Decrease Amiodarone to 100 mg once daily   Your physician wants you to follow-up in: 6 months. You will receive a reminder letter in the mail two months in advance. If you don't receive a letter, please call our office to schedule the follow-up appointment.  Your physician recommends that you return for lab work in:  Fasting labs at your 6 month visit

## 2014-06-08 NOTE — Assessment & Plan Note (Signed)
Blood pressure is mildly low but he is asymptomatic.

## 2014-06-08 NOTE — Progress Notes (Signed)
HPI  This is a 73 year old male who is here today for a followup visit. He has known history of coronary artery disease status post four-vessel CABG in 1997 at [facility], persistent atrial fibrillation maintained in sinus rhythm on amiodarone, COPD with tobacco use, hyperlipidemia, hypertension, carotid artery disease status post left carotid endarterectomy, type 2 diabetes and GI bleed in March of 2014 with supratherapeutic INR (8).  Most recent echocardiogram was done in March of 2014 which showed an ejection fraction of 50-55%, moderately dilated left atrium, mild to moderate MR and moderate pulmonary hypertension. He was hospitalized in March of 2014 with GI bleed and hemoglobin of 5.8. His INR was 8 on presentation. EGD showed no obvious source of bleeding. He was subsequently evaluated by Dr. Deatra Ina. Colonoscopy showed AVMs in the cecum which were cauterized. He was placed back on warfarin. 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 He  underwent laser ablation on June 25. Warfarin was resumed with no recurrent GI bleed. He developed hematuria and was diagnosed with bladder cancer and has been treated.He has been doing well from a cardiac standpoint and denies chest pain or dyspnea. Blood pressure has been low lately but he denies any dizziness. He did have worsening vision recently and was told about corneal scarring which could be related to amiodarone versus recent chemotherapy.     No Known Allergies   Current Outpatient Prescriptions on File Prior to Visit  Medication Sig Dispense Refill  . albuterol (PROVENTIL) (2.5 MG/3ML) 0.083% nebulizer solution Take 2.5 mg by nebulization every 4 (four) hours as needed for shortness of breath.       . allopurinol (ZYLOPRIM) 300 MG tablet Take 150 mg by mouth every morning.       Marland Kitchen amiodarone (PACERONE) 200 MG tablet Take 200 mg by mouth daily.      Marland Kitchen aspirin EC 81 MG tablet Take 81 mg by mouth at bedtime.      Marland Kitchen atorvastatin (LIPITOR) 10 MG  tablet Take 1 tablet (10 mg total) by mouth daily.  90 tablet  3  . diltiazem (DILACOR XR) 120 MG 24 hr capsule Take 1 capsule (120 mg total) by mouth every morning.  90 capsule  3  . ferrous sulfate 325 (65 FE) MG tablet Take 325 mg by mouth daily with breakfast.      . formoterol (FORADIL) 12 MCG capsule for inhaler Place 12 mcg into inhaler and inhale 2 (two) times daily as needed for wheezing.       . furosemide (LASIX) 20 MG tablet Take 20 mg by mouth daily.       Marland Kitchen losartan (COZAAR) 25 MG tablet Take 1 tablet (25 mg total) by mouth daily.  90 tablet  3  . omeprazole (PRILOSEC) 20 MG capsule Take 20 mg by mouth every morning.       . tiotropium (SPIRIVA) 18 MCG inhalation capsule Place 18 mcg into inhaler and inhale daily after supper.       . warfarin (COUMADIN) 1 MG tablet Take 1.5 mg by mouth as directed.       No current facility-administered medications on file prior to visit.     Past Medical History  Diagnosis Date  . Coronary heart disease   . Hyperlipidemia   . HTN (hypertension)   . COPD (chronic obstructive pulmonary disease)   . PVD (peripheral vascular disease)   . Atrial fibrillation     converted to NSR 5/09 on pacerone  . Shortness of  breath     on exertion  . Diabetes mellitus without complication   . Arthritis     fingers  . Anemia   . DJD (degenerative joint disease) of hip   . GI bleed     01/2013 while on warfarin with a hemoglobin of 5.8 (INR was 8)  . CHF (congestive heart failure)     EF 50-55%  . Carotid artery occlusion   . Complication of anesthesia   . PONV (postoperative nausea and vomiting)   . Cataracts, bilateral     Hx: of  . Heart murmur   . Pneumonia     Hx: of  . Cancer of kidney   . Skin cancer 12/2013    Rt. ear  . Bladder cancer      Past Surgical History  Procedure Laterality Date  . Tonsillectomy      as child  . Colonoscopy N/A 02/23/2013    Procedure: COLONOSCOPY;  Surgeon: Inda Castle, MD;  Location: [facility]  ENDOSCOPY;  Service: Endoscopy;  Laterality: N/A;  . Esophagogastroduodenoscopy N/A 02/23/2013    Procedure: ESOPHAGOGASTRODUODENOSCOPY (EGD);  Surgeon: Inda Castle, MD;  Location: [facility];  Service: Endoscopy;  Laterality: N/A;  . Hot hemostasis N/A 05/04/2013    Procedure: HOT HEMOSTASIS (ARGON PLASMA COAGULATION/BICAP);  Surgeon: Inda Castle, MD;  Location: [facility] ENDOSCOPY;  Service: Endoscopy;  Laterality: N/A;  . Enteroscopy N/A 05/04/2013    Procedure: ENTEROSCOPY;  Surgeon: Inda Castle, MD;  Location: [facility] ENDOSCOPY;  Service: Endoscopy;  Laterality: N/A;  . Bypass graft  1997 ?  Marland Kitchen Coronary artery bypass graft      15-17 yrs  . Joint replacement Right      Hip 10-12 yrs  . Cardiac catheterization    . Endarterectomy Left 06/15/2013    Procedure: ENDARTERECTOMY CAROTID- LEFT;  Surgeon: Conrad Pierson, MD;  Location: [facility];  Service: Vascular;  Laterality: Left;  . Patch angioplasty Left 06/15/2013    Procedure: PATCH ANGIOPLASTY using Vascu-Guard Vascular Patch;  Surgeon: Conrad , MD;  Location: [facility];  Service: Vascular;  Laterality: Left;  . Carotid endarterectomy    . Kidney surgery    . Cataract extraction, bilateral  2015     Family History  Problem Relation Age of Onset  . Emphysema Father   . Heart disease Father   . Cancer Mother     unknwn type  . Tuberculosis Paternal Grandfather   . Peripheral vascular disease Sister   . Hyperlipidemia Brother   . Colon cancer Neg Hx      History   Social History  . Marital Status: Married    Spouse Name: N/A    Number of Children: 3  . Years of Education: N/A   Occupational History  . retired- worked in a print shop x35 yrs    Social History Main Topics  . Smoking status: Current Every Day Smoker -- 0.25 packs/day    Types: Cigarettes  . Smokeless tobacco: Never Used     Comment: Started smoking at age 64.  Tobacco info given 01/12/14  . Alcohol Use: No     Comment: Beer -haven't drank since Dec 2013  .  Drug Use: No  . Sexual Activity: Not on file   Other Topics Concern  . Not on file   Social History Narrative  . No narrative on file      PHYSICAL EXAM   BP 98/52  Pulse 91  Ht 5\' 4"  (1.626  m)  Wt 134 lb 4 oz (60.895 kg)  BMI 23.03 kg/m2 Constitutional: He is oriented to person, place, and time. He appears well-developed and well-nourished. No distress.  HENT: No nasal discharge.  Head: Normocephalic and atraumatic.  Eyes: Pupils are equal and round.  Neck: Normal range of motion. Neck supple. No JVD present. No thyromegaly present. There is a faint left carotid bruit. Cardiovascular: Normal rate, regular rhythm, normal heart sounds. Exam reveals no gallop and no friction rub. There is a 2/6 systolic murmur at the left sternal border. Pulmonary/Chest: Effort normal and breath sounds normal. No stridor. No respiratory distress. He has no wheezes. He has no rales. He exhibits no tenderness.  Abdominal: Soft. Bowel sounds are normal. He exhibits no distension. There is no tenderness. There is no rebound and no guarding.  Musculoskeletal: Normal range of motion. He exhibits no edema and no tenderness.  Neurological: He is alert and oriented to person, place, and time. Coordination normal.  Skin: Skin is warm and dry. No rash noted. He is not diaphoretic. No erythema. No pallor.  Psychiatric: He has a normal mood and affect. His behavior is normal. Judgment and thought content normal.       EKG: Sinus rhythm -First degree A-V block  P:QRS - 1:1, Abnormal P axis, H Rate 91  PRi = 288 Possible left ventricular hypertrophy on non-voltage basis.   -ST depression   +   Negative T-waves  -Seen with left ventricular hypertrophy (strain) or digitalis effect -consider ischemia  consider  Anterior ischemia.    ASSESSMENT AND PLAN

## 2014-06-08 NOTE — Assessment & Plan Note (Signed)
He is maintaining a normal sinus rhythm on amiodarone. I decreased the dose to date 100 mg once daily. If the vision changes are deemed to be related to amiodarone, he might require a different antiarrhythmic medication such as dofetilide. He is on long-term anticoagulation with warfarin.

## 2014-06-09 ENCOUNTER — Telehealth: Payer: Self-pay

## 2014-06-09 MED ORDER — AMIODARONE HCL 100 MG PO TABS: 100.0000 mg | ORAL_TABLET | Freq: Every day | ORAL | Status: DC

## 2014-06-09 NOTE — Telephone Encounter (Signed)
Rx Faxed patient aware

## 2014-06-09 NOTE — Telephone Encounter (Signed)
Pt would like his Amiodarone transferred from [facility], to [place] in [place], [place] [phone]. Please call.

## 2014-06-10 HISTORY — PX: KIDNEY SURGERY: SHX687

## 2014-06-14 ENCOUNTER — Ambulatory Visit (INDEPENDENT_AMBULATORY_CARE_PROVIDER_SITE_OTHER): Payer: Commercial Managed Care - HMO

## 2014-06-14 ENCOUNTER — Telehealth: Payer: Self-pay

## 2014-06-14 DIAGNOSIS — Z7901 Long term (current) use of anticoagulants: Secondary | ICD-10-CM

## 2014-06-14 DIAGNOSIS — Z5181 Encounter for therapeutic drug level monitoring: Secondary | ICD-10-CM | POA: Diagnosis not present

## 2014-06-14 DIAGNOSIS — I4891 Unspecified atrial fibrillation: Secondary | ICD-10-CM

## 2014-06-14 LAB — POCT INR: INR: 3.1

## 2014-06-14 MED ORDER — WARFARIN SODIUM 1 MG PO TABS: ORAL_TABLET | ORAL | Status: DC

## 2014-06-14 NOTE — Telephone Encounter (Signed)
Returned call to pt advised Zpak can slightly increase effects of Coumadin.  He has completed zpak, continues on Ceftin which does not interact with Coumadin.  Advised pt to continue with plan made at today's OV, skip today's dosage of Coumadin, then resume same dosage 1 tab QD, except 1.5 tablets MWF.  Recheck in 4 weeks.  Call if any new medications rx before next OV.  Pt verbalized understanding.

## 2014-06-14 NOTE — Telephone Encounter (Signed)
Pt called back, states he was taking Azithromyon 250 mg, and is currently taking Zefuroxime 500 mg

## 2014-06-30 ENCOUNTER — Ambulatory Visit: Payer: Medicare PPO | Admitting: Family

## 2014-06-30 ENCOUNTER — Other Ambulatory Visit (HOSPITAL_COMMUNITY): Payer: Medicare PPO

## 2014-07-06 ENCOUNTER — Encounter: Payer: Self-pay | Admitting: Family

## 2014-07-07 ENCOUNTER — Ambulatory Visit (INDEPENDENT_AMBULATORY_CARE_PROVIDER_SITE_OTHER): Payer: Commercial Managed Care - HMO | Admitting: Family

## 2014-07-07 ENCOUNTER — Encounter: Payer: Self-pay | Admitting: Family

## 2014-07-07 ENCOUNTER — Ambulatory Visit (HOSPITAL_COMMUNITY)
Admission: RE | Admit: 2014-07-07 | Discharge: 2014-07-07 | Disposition: A | Discharge status: Home / Self Care | Payer: Medicare HMO | Source: Ambulatory Visit | Attending: Family | Admitting: Family

## 2014-07-07 VITALS — BP 113/65 | HR 72 | Resp 16 | Ht 64.0 in | Wt 136.0 lb

## 2014-07-07 DIAGNOSIS — Z9221 Personal history of antineoplastic chemotherapy: Secondary | ICD-10-CM | POA: Diagnosis not present

## 2014-07-07 DIAGNOSIS — Z9889 Other specified postprocedural states: Secondary | ICD-10-CM | POA: Insufficient documentation

## 2014-07-07 DIAGNOSIS — F17200 Nicotine dependence, unspecified, uncomplicated: Secondary | ICD-10-CM | POA: Insufficient documentation

## 2014-07-07 DIAGNOSIS — I4891 Unspecified atrial fibrillation: Secondary | ICD-10-CM | POA: Insufficient documentation

## 2014-07-07 DIAGNOSIS — Z7982 Long term (current) use of aspirin: Secondary | ICD-10-CM | POA: Diagnosis not present

## 2014-07-07 DIAGNOSIS — I6529 Occlusion and stenosis of unspecified carotid artery: Secondary | ICD-10-CM

## 2014-07-07 DIAGNOSIS — Z8551 Personal history of malignant neoplasm of bladder: Secondary | ICD-10-CM | POA: Diagnosis not present

## 2014-07-07 DIAGNOSIS — Z7901 Long term (current) use of anticoagulants: Secondary | ICD-10-CM | POA: Diagnosis not present

## 2014-07-07 DIAGNOSIS — Z48812 Encounter for surgical aftercare following surgery on the circulatory system: Secondary | ICD-10-CM

## 2014-07-07 NOTE — Patient Instructions (Addendum)
Stroke Prevention Some medical conditions and behaviors are associated with an increased chance of having a stroke. You may prevent a stroke by making healthy choices and managing medical conditions. HOW CAN I REDUCE MY RISK OF HAVING A STROKE?   Stay physically active. Get at least 30 minutes of activity on most or all days.  Do not smoke. It may also be helpful to avoid exposure to secondhand smoke.  Limit alcohol use. Moderate alcohol use is considered to be:  No more than 2 drinks per day for men.  No more than 1 drink per day for nonpregnant women.  Eat healthy foods. This involves:  Eating 5 or more servings of fruits and vegetables a day.  Making dietary changes that address high blood pressure (hypertension), high cholesterol, diabetes, or obesity.  Manage your cholesterol levels.  Making food choices that are high in fiber and low in saturated fat, trans fat, and cholesterol may control cholesterol levels.  Take any prescribed medicines to control cholesterol as directed by your health care provider.  Manage your diabetes.  Controlling your carbohydrate and sugar intake is recommended to manage diabetes.  Take any prescribed medicines to control diabetes as directed by your health care provider.  Control your hypertension.  Making food choices that are low in salt (sodium), saturated fat, trans fat, and cholesterol is recommended to manage hypertension.  Take any prescribed medicines to control hypertension as directed by your health care provider.  Maintain a healthy weight.  Reducing calorie intake and making food choices that are low in sodium, saturated fat, trans fat, and cholesterol are recommended to manage weight.  Stop drug abuse.  Avoid taking birth control pills.  Talk to your health care provider about the risks of taking birth control pills if you are over 35 years old, smoke, get migraines, or have ever had a blood clot.  Get evaluated for sleep  disorders (sleep apnea).  Talk to your health care provider about getting a sleep evaluation if you snore a lot or have excessive sleepiness.  Take medicines only as directed by your health care provider.  For some people, aspirin or blood thinners (anticoagulants) are helpful in reducing the risk of forming abnormal blood clots that can lead to stroke. If you have the irregular heart rhythm of atrial fibrillation, you should be on a blood thinner unless there is a good reason you cannot take them.  Understand all your medicine instructions.  Make sure that other conditions (such as anemia or atherosclerosis) are addressed. SEEK IMMEDIATE MEDICAL CARE IF:   You have sudden weakness or numbness of the face, arm, or leg, especially on one side of the body.  Your face or eyelid droops to one side.  You have sudden confusion.  You have trouble speaking (aphasia) or understanding.  You have sudden trouble seeing in one or both eyes.  You have sudden trouble walking.  You have dizziness.  You have a loss of balance or coordination.  You have a sudden, severe headache with no known cause.  You have new chest pain or an irregular heartbeat. Any of these symptoms may represent a serious problem that is an emergency. Do not wait to see if the symptoms will go away. Get medical help at once. Call your local emergency services (911 in [place] Do not drive yourself to the hospital. Document Released: 12/04/2004 Document Revised: 03/13/2014 Document Reviewed: 04/29/2013 ExitCare Patient Information 2015 [employer]. This information is not intended to replace advice given   to you by your health care provider. Make sure you discuss any questions you have with your health care provider.   Smoking Cessation Quitting smoking is important to your health and has many advantages. However, it is not always easy to quit since nicotine is a very addictive drug. Oftentimes, people try 3 times or more  before being able to quit. This document explains the best ways for you to prepare to quit smoking. Quitting takes hard work and a lot of effort, but you can do it. ADVANTAGES OF QUITTING SMOKING  You will live longer, feel better, and live better.  Your body will feel the impact of quitting smoking almost immediately.  Within 20 minutes, blood pressure decreases. Your pulse returns to its normal level.  After 8 hours, carbon monoxide levels in the blood return to normal. Your oxygen level increases.  After 24 hours, the chance of having a heart attack starts to decrease. Your breath, hair, and body stop smelling like smoke.  After 48 hours, damaged nerve endings begin to recover. Your sense of taste and smell improve.  After 72 hours, the body is virtually free of nicotine. Your bronchial tubes relax and breathing becomes easier.  After 2 to 12 weeks, lungs can hold more air. Exercise becomes easier and circulation improves.  The risk of having a heart attack, stroke, cancer, or lung disease is greatly reduced.  After 1 year, the risk of coronary heart disease is cut in half.  After 5 years, the risk of stroke falls to the same as a nonsmoker.  After 10 years, the risk of lung cancer is cut in half and the risk of other cancers decreases significantly.  After 15 years, the risk of coronary heart disease drops, usually to the level of a nonsmoker.  If you are pregnant, quitting smoking will improve your chances of having a healthy baby.  The people you live with, especially any children, will be healthier.  You will have extra money to spend on things other than cigarettes. QUESTIONS TO THINK ABOUT BEFORE ATTEMPTING TO QUIT You may want to talk about your answers with your health care provider.  Why do you want to quit?  If you tried to quit in the past, what helped and what did not?  What will be the most difficult situations for you after you quit? How will you plan to  handle them?  Who can help you through the tough times? Your family? Friends? A health care provider?  What pleasures do you get from smoking? What ways can you still get pleasure if you quit? Here are some questions to ask your health care provider:  How can you help me to be successful at quitting?  What medicine do you think would be best for me and how should I take it?  What should I do if I need more help?  What is smoking withdrawal like? How can I get information on withdrawal? GET READY  Set a quit date.  Change your environment by getting rid of all cigarettes, ashtrays, matches, and lighters in your home, car, or work. Do not let people smoke in your home.  Review your past attempts to quit. Think about what worked and what did not. GET SUPPORT AND ENCOURAGEMENT You have a better chance of being successful if you have help. You can get support in many ways.  Tell your family, friends, and coworkers that you are going to quit and need their support. Ask them not   to smoke around you.  Get individual, group, or telephone counseling and support. Programs are available at local hospitals and health centers. Call your local health department for information about programs in your area.  Spiritual beliefs and practices may help some smokers quit.  Download a "quit meter" on your computer to keep track of quit statistics, such as how long you have gone without smoking, cigarettes not smoked, and money saved.  Get a self-help book about quitting smoking and staying off tobacco. LEARN NEW SKILLS AND BEHAVIORS  Distract yourself from urges to smoke. Talk to someone, go for a walk, or occupy your time with a task.  Change your normal routine. Take a different route to work. Drink tea instead of coffee. Eat breakfast in a different place.  Reduce your stress. Take a hot bath, exercise, or read a book.  Plan something enjoyable to do every day. Reward yourself for not  smoking.  Explore interactive web-based programs that specialize in helping you quit. GET MEDICINE AND USE IT CORRECTLY Medicines can help you stop smoking and decrease the urge to smoke. Combining medicine with the above behavioral methods and support can greatly increase your chances of successfully quitting smoking.  Nicotine replacement therapy helps deliver nicotine to your body without the negative effects and risks of smoking. Nicotine replacement therapy includes nicotine gum, lozenges, inhalers, nasal sprays, and skin patches. Some may be available over-the-counter and others require a prescription.  Antidepressant medicine helps people abstain from smoking, but how this works is unknown. This medicine is available by prescription.  Nicotinic receptor partial agonist medicine simulates the effect of nicotine in your brain. This medicine is available by prescription. Ask your health care provider for advice about which medicines to use and how to use them based on your health history. Your health care provider will tell you what side effects to look out for if you choose to be on a medicine or therapy. Carefully read the information on the package. Do not use any other product containing nicotine while using a nicotine replacement product.  RELAPSE OR DIFFICULT SITUATIONS Most relapses occur within the first 3 months after quitting. Do not be discouraged if you start smoking again. Remember, most people try several times before finally quitting. You may have symptoms of withdrawal because your body is used to nicotine. You may crave cigarettes, be irritable, feel very hungry, cough often, get headaches, or have difficulty concentrating. The withdrawal symptoms are only temporary. They are strongest when you first quit, but they will go away within 10-14 days. To reduce the chances of relapse, try to:  Avoid drinking alcohol. Drinking lowers your chances of successfully quitting.  Reduce the  amount of caffeine you consume. Once you quit smoking, the amount of caffeine in your body increases and can give you symptoms, such as a rapid heartbeat, sweating, and anxiety.  Avoid smokers because they can make you want to smoke.  Do not let weight gain distract you. Many smokers will gain weight when they quit, usually less than 10 pounds. Eat a healthy diet and stay active. You can always lose the weight gained after you quit.  Find ways to improve your mood other than smoking. FOR MORE INFORMATION  www.smokefree.gov  Document Released: 10/21/2001 Document Revised: 03/13/2014 Document Reviewed: 02/05/2012 ExitCare Patient Information 2015 [employer]. This information is not intended to replace advice given to you by your health care provider. Make sure you discuss any questions you have with your health care   provider.  

## 2014-07-07 NOTE — Progress Notes (Signed)
Established Carotid Patient   History of Present Illness  Jason Larsen is a 73 y.o. male patient of Dr. 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  He returns today for follow up. He has had some left jawline numbness since the surgery. The patient has had no TIA or CVA sx. He does not feel the jawline numbness affects ADL.   The patient denies amaurosis fugax or monocular blindness.  The patient  denies facial drooping.  Pt. denies hemiplegia.  The patient denies receptive or expressive aphasia.    Pt reports New Medical or Surgical History: cataract extraction, receiving chemotherapy for bladder tumors.  He denies claudication symptoms in legs with walking, states he walks a great deal.  Pt Diabetic: No, once his blood sugar was elevated during a hospitalization Pt smoker: smoker  (1/2 ppd, started at age 51 yrs)  Pt meds include: Statin : Yes ASA: Yes Other anticoagulants/antiplatelets: coumadin for atrial fib   Past Medical History  Diagnosis Date  . Coronary heart disease   . Hyperlipidemia   . HTN (hypertension)   . COPD (chronic obstructive pulmonary disease)   . PVD (peripheral vascular disease)   . Atrial fibrillation     converted to NSR 5/09 on pacerone  . Shortness of breath     on exertion  . Diabetes mellitus without complication   . Arthritis     fingers  . Anemia   . DJD (degenerative joint disease) of hip   . GI bleed     01/2013 while on warfarin with a hemoglobin of 5.8 (INR was 8)  . CHF (congestive heart failure)     EF 50-55%  . Carotid artery occlusion   . Complication of anesthesia   . PONV (postoperative nausea and vomiting)   . Cataracts, bilateral     Hx: of  . Heart murmur   . Pneumonia     Hx: of  . Cancer of kidney   . Skin cancer 12/2013    Rt. ear  . Bladder cancer     Social History History  Substance Use Topics  . Smoking status: Current Every Day Smoker -- 0.25 packs/day    Types: Cigarettes  .  Smokeless tobacco: Never Used     Comment: Started smoking at age 79.  Tobacco info given 01/12/14  . Alcohol Use: No     Comment: Beer -haven't drank since Dec 2013    Family History Family History  Problem Relation Age of Onset  . Emphysema Father   . Heart disease Father   . Cancer Mother     unknwn type  . Tuberculosis Paternal Grandfather   . Peripheral vascular disease Sister   . Hyperlipidemia Brother   . Colon cancer Neg Hx     Surgical History Past Surgical History  Procedure Laterality Date  . Tonsillectomy      as child  . Colonoscopy N/A 02/23/2013    Procedure: COLONOSCOPY;  Surgeon: Inda Castle, MD;  Location: [facility];  Service: Endoscopy;  Laterality: N/A;  . Esophagogastroduodenoscopy N/A 02/23/2013    Procedure: ESOPHAGOGASTRODUODENOSCOPY (EGD);  Surgeon: Inda Castle, MD;  Location: [facility];  Service: Endoscopy;  Laterality: N/A;  . Hot hemostasis N/A 05/04/2013    Procedure: HOT HEMOSTASIS (ARGON PLASMA COAGULATION/BICAP);  Surgeon: Inda Castle, MD;  Location: [facility];  Service: Endoscopy;  Laterality: N/A;  . Enteroscopy N/A 05/04/2013    Procedure: ENTEROSCOPY;  Surgeon: Sandy Salaam  Deatra Ina, MD;  Location: [facility];  Service: Endoscopy;  Laterality: N/A;  . Bypass graft  1997 ?  Marland Kitchen Coronary artery bypass graft      15-17 yrs  . Joint replacement Right      Hip 10-12 yrs  . Cardiac catheterization    . Endarterectomy Left 06/15/2013    Procedure: ENDARTERECTOMY CAROTID- LEFT;  Surgeon: Conrad Micanopy, MD;  Location: [facility];  Service: Vascular;  Laterality: Left;  . Patch angioplasty Left 06/15/2013    Procedure: PATCH ANGIOPLASTY using Vascu-Guard Vascular Patch;  Surgeon: Conrad Pevely, MD;  Location: [facility];  Service: Vascular;  Laterality: Left;  . Carotid endarterectomy    . Kidney surgery    . Cataract extraction, bilateral  2015    No Known Allergies  Current Outpatient Prescriptions  Medication Sig Dispense Refill  . albuterol  (PROVENTIL) (2.5 MG/3ML) 0.083% nebulizer solution Take 2.5 mg by nebulization every 4 (four) hours as needed for shortness of breath.       . allopurinol (ZYLOPRIM) 300 MG tablet Take 150 mg by mouth every morning.       Marland Kitchen amiodarone (PACERONE) 100 MG tablet Take 1 tablet (100 mg total) by mouth daily.  90 tablet  3  . aspirin EC 81 MG tablet Take 81 mg by mouth at bedtime.      Marland Kitchen atorvastatin (LIPITOR) 10 MG tablet Take 1 tablet (10 mg total) by mouth daily.  90 tablet  3  . diltiazem (DILACOR XR) 120 MG 24 hr capsule Take 1 capsule (120 mg total) by mouth every morning.  90 capsule  3  . ferrous sulfate 325 (65 FE) MG tablet Take 325 mg by mouth daily with breakfast.      . formoterol (FORADIL) 12 MCG capsule for inhaler Place 12 mcg into inhaler and inhale 2 (two) times daily as needed for wheezing.       . furosemide (LASIX) 20 MG tablet Take 20 mg by mouth daily.       Marland Kitchen losartan (COZAAR) 25 MG tablet Take 1 tablet (25 mg total) by mouth daily.  90 tablet  3  . omeprazole (PRILOSEC) 20 MG capsule Take 20 mg by mouth every morning.       . tiotropium (SPIRIVA) 18 MCG inhalation capsule Place 18 mcg into inhaler and inhale daily after supper.       . warfarin (COUMADIN) 1 MG tablet Take as directed by anticoagulation clinic  135 tablet  1   No current facility-administered medications for this visit.    Review of Systems : See HPI for pertinent positives and negatives.  Physical Examination  Filed Vitals:   07/07/14 1152 07/07/14 1155  BP: 115/64 113/65  Pulse: 73 72  Resp:  16  Height:  5\' 4"  (1.626 m)  Weight:  136 lb (61.689 kg)  SpO2:  98%   Body mass index is 23.33 kg/(m^2).  General: A&O x 3, WDWN  Eyes: PERRLA, EOMI  Neck: Supple, no nuchal rigidity, well healed incision  Pulmonary: Sym exp, little air movement in all fields, expiratory wheezes in all fields, frequent moist cough. Cardiac: RRR, Nl S1, S2, no Murmur detected  Vascular:  Vessel  Right  Left   Radial   Palpable  Palpable   Brachial  Palpable  Palpable   Carotid  Palpable, with soft bruit  Palpable, with soft bruit   Aorta  Not palpable  N/A   Popliteal  Not palpable  Not palpable  PT  Palpable  Not Palpable   DP  Palpable  Not Palpable    Gastrointestinal: soft, NTND, -G/R, - HSM, - masses, - CVAT B  Musculoskeletal: M/S 5/5 throughout , Extremities without ischemic changes. Marked kyphosis. Skin: Rhinophyma Neurologic: CN 2-12 intact , Pain and light touch intact in extremities except L jawline: decreased sensation, he  Motor exam as listed above. Intentional head bobbing when he talks.  Non-Invasive Vascular Imaging CAROTID DUPLEX 07/07/2014   CEREBROVASCULAR DUPLEX EVALUATION    INDICATION: Carotid disease     PREVIOUS INTERVENTION(S): Left carotid endarterectomy 06/15/2013    DUPLEX EXAM:     RIGHT  LEFT  Peak Systolic Velocities (cm/s) End Diastolic Velocities (cm/s) Plaque LOCATION Peak Systolic Velocities (cm/s) End Diastolic Velocities (cm/s) Plaque  81 11  CCA PROXIMAL 132 20   76 16  CCA MID 119 24 HT  80 18 HT CCA DISTAL 109 20   138 19 HT ECA 123 13   193 37 CP ICA PROXIMAL 112 33   162 45 CP ICA MID 108 32   138 35  ICA DISTAL 80 19     2.4 ICA / CCA Ratio (PSV) NA  Antegrade  Vertebral Flow Antegrade    Brachial Systolic Pressure (mmHg)   Within normal limits  Brachial Artery Waveforms Within normal limits     Plaque Morphology:  HM = Homogeneous, HT = Heterogeneous, CP = Calcific Plaque, SP = Smooth Plaque, IP = Irregular Plaque     ADDITIONAL FINDINGS:     IMPRESSION: 1. Evidence of 40%-59% stenosis of the right internal carotid artery. Plaque is calcific with acoustic shadow which may underestimate degree of stenosis. 2. Widely patent left carotid endarterectomy without evidence of restenosis or hyperplasia.  3. Bilateral vertebral artery is within normal limits.    Compared to the previous exam:  Increased velocities of the right internal carotid  artery compared to previous exam    Assessment: Jason Larsen is a 73 y.o. 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 He presents with asymptomatic 40 - 59 % Right ICA  Stenosis and widely patent left ICA which is the CEA site. Increased velocities of the right internal carotid artery compared to previous exam.  Plan: Follow-up in 6 months with Carotid Duplex scan.  He was counseled re smoking cessation.  I discussed in depth with the patient the nature of atherosclerosis, and emphasized the importance of maximal medical management including strict control of blood pressure, blood glucose, and lipid levels, obtaining regular exercise, and cessation of smoking.  The patient is aware that without maximal medical management the underlying atherosclerotic disease process will progress, limiting the benefit of any interventions. The patient was given information about stroke prevention and what symptoms should prompt the patient to seek immediate medical care. Thank you for allowing [place] to participate in this patient's care.  Clemon Chambers, RN, MSN, FNP-C [facility] Office: [phone]  Clinic Physician: Bridgett Larsson  07/07/2014 11:28 AM

## 2014-07-07 NOTE — Addendum Note (Signed)
Addended by: Mena Goes on: 07/07/2014 05:08 PM   Modules accepted: Orders

## 2014-07-12 ENCOUNTER — Ambulatory Visit (INDEPENDENT_AMBULATORY_CARE_PROVIDER_SITE_OTHER): Payer: Commercial Managed Care - HMO

## 2014-07-12 DIAGNOSIS — Z5181 Encounter for therapeutic drug level monitoring: Secondary | ICD-10-CM

## 2014-07-12 DIAGNOSIS — I4891 Unspecified atrial fibrillation: Secondary | ICD-10-CM

## 2014-07-12 DIAGNOSIS — Z7901 Long term (current) use of anticoagulants: Secondary | ICD-10-CM

## 2014-07-12 LAB — POCT INR: INR: 3.2

## 2014-08-09 ENCOUNTER — Ambulatory Visit (INDEPENDENT_AMBULATORY_CARE_PROVIDER_SITE_OTHER): Payer: Commercial Managed Care - HMO

## 2014-08-09 DIAGNOSIS — I4891 Unspecified atrial fibrillation: Secondary | ICD-10-CM

## 2014-08-09 DIAGNOSIS — Z5181 Encounter for therapeutic drug level monitoring: Secondary | ICD-10-CM

## 2014-08-09 DIAGNOSIS — Z7901 Long term (current) use of anticoagulants: Secondary | ICD-10-CM | POA: Diagnosis not present

## 2014-08-09 LAB — POCT INR: INR: 3.4

## 2014-08-30 ENCOUNTER — Ambulatory Visit (INDEPENDENT_AMBULATORY_CARE_PROVIDER_SITE_OTHER): Payer: Commercial Managed Care - HMO

## 2014-08-30 DIAGNOSIS — Z7901 Long term (current) use of anticoagulants: Secondary | ICD-10-CM

## 2014-08-30 DIAGNOSIS — Z5181 Encounter for therapeutic drug level monitoring: Secondary | ICD-10-CM

## 2014-08-30 DIAGNOSIS — I4891 Unspecified atrial fibrillation: Secondary | ICD-10-CM

## 2014-08-30 LAB — POCT INR: INR: 2.7

## 2014-09-27 ENCOUNTER — Ambulatory Visit (INDEPENDENT_AMBULATORY_CARE_PROVIDER_SITE_OTHER): Payer: Commercial Managed Care - HMO

## 2014-09-27 DIAGNOSIS — Z7901 Long term (current) use of anticoagulants: Secondary | ICD-10-CM

## 2014-09-27 DIAGNOSIS — I4891 Unspecified atrial fibrillation: Secondary | ICD-10-CM

## 2014-09-27 DIAGNOSIS — Z5181 Encounter for therapeutic drug level monitoring: Secondary | ICD-10-CM

## 2014-09-27 LAB — POCT INR: INR: 3.2

## 2014-10-10 DIAGNOSIS — W19XXXA Unspecified fall, initial encounter: Secondary | ICD-10-CM

## 2014-10-10 DIAGNOSIS — Y92009 Unspecified place in unspecified non-institutional (private) residence as the place of occurrence of the external cause: Secondary | ICD-10-CM

## 2014-10-10 HISTORY — DX: Unspecified place in unspecified non-institutional (private) residence as the place of occurrence of the external cause: Y92.009

## 2014-10-10 HISTORY — DX: Unspecified fall, initial encounter: W19.XXXA

## 2014-10-25 ENCOUNTER — Ambulatory Visit (INDEPENDENT_AMBULATORY_CARE_PROVIDER_SITE_OTHER): Payer: Commercial Managed Care - HMO

## 2014-10-25 DIAGNOSIS — Z5181 Encounter for therapeutic drug level monitoring: Secondary | ICD-10-CM

## 2014-10-25 DIAGNOSIS — Z7901 Long term (current) use of anticoagulants: Secondary | ICD-10-CM

## 2014-10-25 DIAGNOSIS — I4891 Unspecified atrial fibrillation: Secondary | ICD-10-CM

## 2014-10-25 LAB — POCT INR: INR: 3.2

## 2014-11-04 ENCOUNTER — Emergency Department: Payer: Self-pay | Admitting: Emergency Medicine

## 2014-11-04 LAB — TROPONIN I: Troponin-I: <0.02 ng/mL

## 2014-11-04 LAB — CBC
HCT: 38.7 % — ABNORMAL LOW (ref 40.0–52.0)
HGB: 12.5 g/dL — AB (ref 13.0–18.0)
MCH: 33.5 pg (ref 26.0–34.0)
MCHC: 32.4 g/dL (ref 32.0–36.0)
MCV: 103 fL — ABNORMAL HIGH (ref 80–100)
Platelet: 95 10*3/uL — ABNORMAL LOW (ref 150–440)
RBC: 3.74 10*6/uL — ABNORMAL LOW (ref 4.40–5.90)
RDW: 14.4 % (ref 11.5–14.5)
WBC: 5 10*3/uL (ref 3.8–10.6)

## 2014-11-04 LAB — URINALYSIS, COMPLETE
BLOOD: NEGATIVE
Bacteria: NONE SEEN
Bilirubin,UR: NEGATIVE
Glucose,UR: NEGATIVE mg/dL (ref 0–75)
KETONE: NEGATIVE
Leukocyte Esterase: NEGATIVE
NITRITE: NEGATIVE
PROTEIN: NEGATIVE
Ph: 5 (ref 4.5–8.0)
RBC,UR: 1 /HPF (ref 0–5)
SPECIFIC GRAVITY: 1.028 (ref 1.003–1.030)
Squamous Epithelial: <1
WBC UR: 1 /HPF (ref 0–5)

## 2014-11-04 LAB — COMPREHENSIVE METABOLIC PANEL
ALK PHOS: 125 U/L — AB
ALT: 58 U/L
Albumin: 3.1 g/dL — ABNORMAL LOW (ref 3.4–5.0)
Anion Gap: 7 (ref 7–16)
BUN: 15 mg/dL (ref 7–18)
Bilirubin,Total: 0.9 mg/dL (ref 0.2–1.0)
CHLORIDE: 107 mmol/L (ref 98–107)
Calcium, Total: 8.6 mg/dL (ref 8.5–10.1)
Co2: 27 mmol/L (ref 21–32)
Creatinine: 0.79 mg/dL (ref 0.60–1.30)
EGFR (African American): >60
GLUCOSE: 89 mg/dL (ref 65–99)
Osmolality: 282 (ref 275–301)
POTASSIUM: 4 mmol/L (ref 3.5–5.1)
SGOT(AST): 80 U/L — ABNORMAL HIGH (ref 15–37)
Sodium: 141 mmol/L (ref 136–145)
Total Protein: 7.2 g/dL (ref 6.4–8.2)

## 2014-11-16 ENCOUNTER — Encounter (HOSPITAL_COMMUNITY): Payer: Medicare Other

## 2014-11-22 ENCOUNTER — Ambulatory Visit (INDEPENDENT_AMBULATORY_CARE_PROVIDER_SITE_OTHER): Payer: Commercial Managed Care - HMO

## 2014-11-22 ENCOUNTER — Other Ambulatory Visit: Payer: Self-pay | Admitting: *Deleted

## 2014-11-22 DIAGNOSIS — I4891 Unspecified atrial fibrillation: Secondary | ICD-10-CM

## 2014-11-22 DIAGNOSIS — Z5181 Encounter for therapeutic drug level monitoring: Secondary | ICD-10-CM

## 2014-11-22 DIAGNOSIS — Z7901 Long term (current) use of anticoagulants: Secondary | ICD-10-CM

## 2014-11-22 LAB — POCT INR: INR: 2.6

## 2014-11-22 MED ORDER — ATORVASTATIN CALCIUM 10 MG PO TABS: 10.0000 mg | ORAL_TABLET | Freq: Every day | ORAL | Status: DC

## 2014-12-05 ENCOUNTER — Ambulatory Visit (INDEPENDENT_AMBULATORY_CARE_PROVIDER_SITE_OTHER): Payer: Commercial Managed Care - HMO | Admitting: Cardiovascular Disease

## 2014-12-05 ENCOUNTER — Encounter: Payer: Self-pay | Admitting: Cardiovascular Disease

## 2014-12-05 VITALS — BP 104/58 | HR 86 | Ht 64.0 in | Wt 137.0 lb

## 2014-12-05 DIAGNOSIS — I481 Persistent atrial fibrillation: Secondary | ICD-10-CM

## 2014-12-05 DIAGNOSIS — I2510 Atherosclerotic heart disease of native coronary artery without angina pectoris: Secondary | ICD-10-CM

## 2014-12-05 DIAGNOSIS — I10 Essential (primary) hypertension: Secondary | ICD-10-CM

## 2014-12-05 DIAGNOSIS — I4819 Other persistent atrial fibrillation: Secondary | ICD-10-CM

## 2014-12-05 DIAGNOSIS — E785 Hyperlipidemia, unspecified: Secondary | ICD-10-CM

## 2014-12-05 MED ORDER — TIOTROPIUM BROMIDE MONOHYDRATE 18 MCG IN CAPS: 18.0000 ug | ORAL_CAPSULE | Freq: Every day | RESPIRATORY_TRACT | Status: AC

## 2014-12-05 MED ORDER — FORMOTEROL FUMARATE 12 MCG IN CAPS: 12.0000 ug | ORAL_CAPSULE | Freq: Two times a day (BID) | RESPIRATORY_TRACT | Status: AC | PRN

## 2014-12-05 NOTE — Patient Instructions (Signed)
Your physician recommends that you have labs today. Lipid and Liver   Your physician wants you to follow-up in: 6 months. You will receive a reminder letter in the mail two months in advance. If you don't receive a letter, please call our office to schedule the follow-up appointment.  

## 2014-12-05 NOTE — Progress Notes (Signed)
HPI  This is a 74 year old male who is here today for a followup visit. He has known history of coronary artery disease status post four-vessel CABG in 1997 at [facility], persistent atrial fibrillation maintained in sinus rhythm on amiodarone, COPD with tobacco use, hyperlipidemia, hypertension, carotid artery disease status post left carotid endarterectomy, type 2 diabetes and GI bleed in March of 2014 with supratherapeutic INR (8).  Most recent echocardiogram was done in March of 2014 which showed an ejection fraction of 50-55%, moderately dilated left atrium, mild to moderate MR and moderate pulmonary hypertension. He was hospitalized in March of 2014 with GI bleed and hemoglobin of 5.8. His INR was 8 on presentation. EGD showed no obvious source of bleeding. He was subsequently evaluated by Dr. Deatra Ina. Colonoscopy showed AVMs in the cecum which were cauterized. He was placed back on warfarin. 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 He  underwent laser ablation on June 25. Warfarin was resumed with no recurrent GI bleed. He developed hematuria and was diagnosed with bladder cancer and has been treated.He has been doing well from a cardiac standpoint and denies chest pain or dyspnea. Blood pressure has been low lately but he denies any dizziness.      No Known Allergies   Current Outpatient Prescriptions on File Prior to Visit  Medication Sig Dispense Refill  . albuterol (PROVENTIL) (2.5 MG/3ML) 0.083% nebulizer solution Take 2.5 mg by nebulization every 4 (four) hours as needed for shortness of breath.     . allopurinol (ZYLOPRIM) 300 MG tablet Take 150 mg by mouth every morning. Gout    . amiodarone (PACERONE) 100 MG tablet Take 1 tablet (100 mg total) by mouth daily. 90 tablet 3  . aspirin EC 81 MG tablet Take 81 mg by mouth at bedtime.    Marland Kitchen atorvastatin (LIPITOR) 10 MG tablet Take 1 tablet (10 mg total) by mouth daily. 90 tablet 3  . diltiazem (DILACOR XR) 120 MG 24 hr capsule Take 1  capsule (120 mg total) by mouth every morning. 90 capsule 3  . ferrous sulfate 325 (65 FE) MG tablet Take 325 mg by mouth daily with breakfast.    . formoterol (FORADIL) 12 MCG capsule for inhaler Place 12 mcg into inhaler and inhale 2 (two) times daily as needed for wheezing.     . furosemide (LASIX) 20 MG tablet Take 20 mg by mouth daily.     Marland Kitchen losartan (COZAAR) 25 MG tablet Take 1 tablet (25 mg total) by mouth daily. 90 tablet 3  . omeprazole (PRILOSEC) 20 MG capsule Take 20 mg by mouth every morning.     . tiotropium (SPIRIVA) 18 MCG inhalation capsule Place 18 mcg into inhaler and inhale daily after supper.     . warfarin (COUMADIN) 1 MG tablet 1.5 mg daily. Take as directed by anticoagulation clinic     No current facility-administered medications on file prior to visit.     Past Medical History  Diagnosis Date  . Coronary heart disease   . Hyperlipidemia   . HTN (hypertension)   . COPD (chronic obstructive pulmonary disease)   . PVD (peripheral vascular disease)   . Atrial fibrillation     converted to NSR 5/09 on pacerone  . Shortness of breath     on exertion  . Diabetes mellitus without complication   . Arthritis     fingers  . Anemia   . DJD (degenerative joint disease) of hip   . GI bleed  01/2013 while on warfarin with a hemoglobin of 5.8 (INR was 8)  . CHF (congestive heart failure)     EF 50-55%  . Carotid artery occlusion   . Complication of anesthesia   . PONV (postoperative nausea and vomiting)   . Cataracts, bilateral     Hx: of  . Heart murmur   . Pneumonia     Hx: of  . Cancer of kidney   . Skin cancer 12/2013    Rt. ear  . Bladder cancer      Past Surgical History  Procedure Laterality Date  . Tonsillectomy      as child  . Colonoscopy N/A 02/23/2013    Procedure: COLONOSCOPY;  Surgeon: Inda Castle, MD;  Location: [facility];  Service: Endoscopy;  Laterality: N/A;  . Esophagogastroduodenoscopy N/A 02/23/2013    Procedure:  ESOPHAGOGASTRODUODENOSCOPY (EGD);  Surgeon: Inda Castle, MD;  Location: [facility];  Service: Endoscopy;  Laterality: N/A;  . Hot hemostasis N/A 05/04/2013    Procedure: HOT HEMOSTASIS (ARGON PLASMA COAGULATION/BICAP);  Surgeon: Inda Castle, MD;  Location: [facility];  Service: Endoscopy;  Laterality: N/A;  . Enteroscopy N/A 05/04/2013    Procedure: ENTEROSCOPY;  Surgeon: Inda Castle, MD;  Location: [facility];  Service: Endoscopy;  Laterality: N/A;  . Bypass graft  1997 ?  Marland Kitchen Coronary artery bypass graft      15-17 yrs  . Joint replacement Right      Hip 10-12 yrs  . Cardiac catheterization    . Endarterectomy Left 06/15/2013    Procedure: ENDARTERECTOMY CAROTID- LEFT;  Surgeon: Conrad Collinsville, MD;  Location: [place];  Service: Vascular;  Laterality: Left;  . Patch angioplasty Left 06/15/2013    Procedure: PATCH ANGIOPLASTY using Vascu-Guard Vascular Patch;  Surgeon: Conrad Maxwell, MD;  Location: [place];  Service: Vascular;  Laterality: Left;  . Kidney surgery  Aug. 2015    Chemo-Bladder   . Cataract extraction, bilateral  2015  . Skin cancer excision Right Feb. 2015  . Carotid endarterectomy Left 06-15-13    cea     Family History  Problem Relation Age of Onset  . Emphysema Father   . Heart disease Father   . Cancer Mother     unknwn type  . Tuberculosis Paternal Grandfather   . Peripheral vascular disease Sister   . Hyperlipidemia Brother   . Colon cancer Neg Hx      History   Social History  . Marital Status: Married    Spouse Name: N/A    Number of Children: 3  . Years of Education: N/A   Occupational History  . retired- worked in a print shop x35 yrs    Social History Main Topics  . Smoking status: Light Tobacco Smoker -- 0.25 packs/day    Types: Cigarettes  . Smokeless tobacco: Never Used     Comment: Started smoking at age 8.  Tobacco info given 01/12/14  . Alcohol Use: No     Comment: Beer -haven't drank since Dec 2013  . Drug Use: No  . Sexual  Activity: Not on file   Other Topics Concern  . Not on file   Social History Narrative      PHYSICAL EXAM   BP 104/58 mmHg  Pulse 86  Ht 5\' 4"  (1.626 m)  Wt 137 lb (62.143 kg)  BMI 23.50 kg/m2 Constitutional: He is oriented to person, place, and time. He appears well-developed and well-nourished. No distress.  HENT: No nasal  discharge.  Head: Normocephalic and atraumatic.  Eyes: Pupils are equal and round.  Neck: Normal range of motion. Neck supple. No JVD present. No thyromegaly present. There is a faint left carotid bruit. Cardiovascular: Normal rate, regular rhythm, normal heart sounds. Exam reveals no gallop and no friction rub. There is a 2/6 systolic murmur at the left sternal border. Pulmonary/Chest: Effort normal and breath sounds normal. No stridor. No respiratory distress. He has no wheezes. He has no rales. He exhibits no tenderness.  Abdominal: Soft. Bowel sounds are normal. He exhibits no distension. There is no tenderness. There is no rebound and no guarding.  Musculoskeletal: Normal range of motion. He exhibits no edema and no tenderness.  Neurological: He is alert and oriented to person, place, and time. Coordination normal.  Skin: Skin is warm and dry. No rash noted. He is not diaphoretic. No erythema. No pallor.  Psychiatric: He has a normal mood and affect. His behavior is normal. Judgment and thought content normal.       EKG: Sinus rhythm with first-degree AV block. Nonspecific ST and T-wave changes in the anterior lateral leads.  ASSESSMENT AND PLAN

## 2014-12-06 LAB — LIPID PANEL
Chol/HDL Ratio: 2.6 ratio units (ref 0.0–5.0)
Cholesterol, Total: 110 mg/dL (ref 100–199)
HDL: 42 mg/dL (ref >39)
LDL Calculated: 49 mg/dL (ref 0–99)
TRIGLYCERIDES: 96 mg/dL (ref 0–149)
VLDL Cholesterol Cal: 19 mg/dL (ref 5–40)

## 2014-12-06 LAB — HEPATIC FUNCTION PANEL
ALK PHOS: 113 IU/L (ref 39–117)
ALT: 27 IU/L (ref 0–44)
AST: 45 IU/L — AB (ref 0–40)
Albumin: 3.3 g/dL — ABNORMAL LOW (ref 3.5–4.8)
BILIRUBIN TOTAL: 0.6 mg/dL (ref 0.0–1.2)
Bilirubin, Direct: 0.22 mg/dL (ref 0.00–0.40)
Total Protein: 6 g/dL (ref 6.0–8.5)

## 2014-12-08 ENCOUNTER — Encounter: Payer: Self-pay | Admitting: Cardiovascular Disease

## 2014-12-08 NOTE — Assessment & Plan Note (Signed)
Blood pressure is controlled on current medications. 

## 2014-12-08 NOTE — Assessment & Plan Note (Signed)
He has no symptoms of angina. Continue medical therapy. 

## 2014-12-08 NOTE — Assessment & Plan Note (Signed)
Continue treatment with atorvastatin. Check fasting lipid and liver profile.

## 2014-12-08 NOTE — Assessment & Plan Note (Signed)
He is maintaining a normal sinus rhythm on amiodarone. Continue long-term anticoagulation with warfarin. I will consider stopping aspirin.

## 2015-01-05 ENCOUNTER — Ambulatory Visit: Payer: Medicare PPO | Admitting: Family

## 2015-01-05 ENCOUNTER — Other Ambulatory Visit (HOSPITAL_COMMUNITY): Payer: Medicare PPO

## 2015-01-09 ENCOUNTER — Inpatient Hospital Stay: Payer: Self-pay | Admitting: Internal Medicine

## 2015-01-10 ENCOUNTER — Encounter: Payer: Self-pay | Admitting: Family

## 2015-01-11 ENCOUNTER — Other Ambulatory Visit (HOSPITAL_COMMUNITY): Payer: Commercial Managed Care - HMO

## 2015-01-11 ENCOUNTER — Ambulatory Visit: Payer: Commercial Managed Care - HMO | Admitting: Family

## 2015-01-12 ENCOUNTER — Telehealth: Payer: Self-pay

## 2015-01-12 NOTE — Telephone Encounter (Signed)
l This encounter was created in error - please disregard.

## 2015-01-12 NOTE — Telephone Encounter (Signed)
L mom for pt to call for appt. Seen note below

## 2015-01-12 NOTE — Telephone Encounter (Signed)
-----   Message from Stana Bunting, RN sent at 01/12/2015  1:54 PM EST -----   ----- Message -----    From: Rise Mu, PA-C    Sent: 01/12/2015   1:40 PM      To: Stana Bunting, RN  Hey,  Dr. Lottie Rater just came up to me in the hospital and asked that we see this guy for hospital follow up. He is an established patient of Dr. Fletcher Anon. He has been admitted for several days at [facility]. [facility] consulted on him for EGD clearance and has seen him twice. There is no need for [place] to see him at this point in the hospital but he will need hospital follow up.   Thanks,  [employer]

## 2015-01-12 NOTE — Telephone Encounter (Signed)
-----   Message from Stana Bunting, RN sent at 01/12/2015  1:54 PM EST -----   ----- Message -----    From: Rise Mu, PA-C    Sent: 01/12/2015   1:40 PM      To: Stana Bunting, RN  Hey,  Dr. Lottie Rater just came up to me in the hospital and asked that we see this guy for hospital follow up. He is an established patient of Dr. Fletcher Anon. He has been admitted for several days at [facility]. [facility] consulted on him for EGD clearance and has seen him twice. There is no need for [place] to see him at this point in the hospital but he will need hospital follow up.   Thanks,  [employer]

## 2015-01-17 ENCOUNTER — Telehealth: Payer: Self-pay

## 2015-01-17 ENCOUNTER — Ambulatory Visit: Payer: Self-pay

## 2015-01-17 DIAGNOSIS — Z5181 Encounter for therapeutic drug level monitoring: Secondary | ICD-10-CM

## 2015-01-17 DIAGNOSIS — I4891 Unspecified atrial fibrillation: Secondary | ICD-10-CM

## 2015-01-17 NOTE — Telephone Encounter (Signed)
Patient was recently hospitalized 3/1-3/12 He received 3 units of blood  He stated he was told he has cirrhosis of the liver  Dr. Baldemar Lenis took him off of his coumadin for good  He wanted Dr. Fletcher Anon and Danae Chen to know  Patient wants to see Dr. Fletcher Anon asap  Patient scheduled for tomorrow 3/10 at 2:15 pm

## 2015-01-17 NOTE — Telephone Encounter (Signed)
Pt niece called, states pt was taken off Coumadin when he was recently in the hospital, and wanted Dr. Rockey Situ to know. Please call.

## 2015-01-18 ENCOUNTER — Ambulatory Visit (INDEPENDENT_AMBULATORY_CARE_PROVIDER_SITE_OTHER): Payer: Commercial Managed Care - HMO | Admitting: Cardiovascular Disease

## 2015-01-18 ENCOUNTER — Encounter: Payer: Self-pay | Admitting: Cardiovascular Disease

## 2015-01-18 VITALS — BP 102/60 | HR 85 | Ht 65.0 in | Wt 149.5 lb

## 2015-01-18 DIAGNOSIS — I2510 Atherosclerotic heart disease of native coronary artery without angina pectoris: Secondary | ICD-10-CM

## 2015-01-18 DIAGNOSIS — R188 Other ascites: Secondary | ICD-10-CM

## 2015-01-18 DIAGNOSIS — I10 Essential (primary) hypertension: Secondary | ICD-10-CM

## 2015-01-18 DIAGNOSIS — I4891 Unspecified atrial fibrillation: Secondary | ICD-10-CM

## 2015-01-18 NOTE — Patient Instructions (Signed)
Stop taking Aspirin and Losartan.  I recommend labs in 1 week : BMP.  Call us and let us known the name of the 2 new medications.   Follow up in 1 month.

## 2015-01-18 NOTE — Progress Notes (Signed)
HPI  This is a 74 year old male who is here today for a followup visit. He has known history of coronary artery disease status post four-vessel CABG in 1997 at [facility], persistent atrial fibrillation maintained in sinus rhythm on amiodarone, COPD with tobacco use, hyperlipidemia, hypertension, recently diagnosed liver cirrhosis, carotid artery disease status post left carotid endarterectomy, type 2 diabetes and GI bleed in March of 2014 with supratherapeutic INR (8).  Most recent echocardiogram was done in March of 2014 which showed an ejection fraction of 50-55%, moderately dilated left atrium, mild to moderate MR and moderate pulmonary hypertension. He was hospitalized in March of 2014 with GI bleed and hemoglobin of 5.8. His INR was 8 on presentation. EGD showed no obvious source of bleeding. He was subsequently evaluated by Dr. Deatra Ina. Colonoscopy showed AVMs in the cecum which were cauterized. He was placed back on warfarin. 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 He  underwent laser ablation on June 25. Warfarin was resumed . He developed hematuria and was diagnosed with bladder cancer and has been treated. He was hospitalized recently due to confusion. He was found to have elevated ammonia level due to liver cirrhosis. Hemoglobin was 6. He underwent transfusion. He underwent an EGD which was unremarkable. Warfarin was discontinued. He was found to have mildly elevated troponin. Disease due to supply demand ischemia. He was not seen by [place] while hospitalized. He does have previous history of excessive alcohol use but quit 3 years ago. He gained 20 pounds recently with ascites. He was started yesterday on furosemide and spironolactone.      No Known Allergies   Current Outpatient Prescriptions on File Prior to Visit  Medication Sig Dispense Refill  . albuterol (PROVENTIL) (2.5 MG/3ML) 0.083% nebulizer solution Take 2.5 mg by nebulization every 4 (four) hours as needed for shortness of  breath.     . allopurinol (ZYLOPRIM) 300 MG tablet Take 150 mg by mouth every morning. Gout    . diltiazem (DILACOR XR) 120 MG 24 hr capsule Take 1 capsule (120 mg total) by mouth every morning. 90 capsule 3  . formoterol (FORADIL) 12 MCG capsule for inhaler Place 1 capsule (12 mcg total) into inhaler and inhale 2 (two) times daily as needed for wheezing. 60 capsule 1  . omeprazole (PRILOSEC) 20 MG capsule Take 20 mg by mouth every morning.     . tiotropium (SPIRIVA) 18 MCG inhalation capsule Place 1 capsule (18 mcg total) into inhaler and inhale daily after supper. 30 capsule 1   No current facility-administered medications on file prior to visit.     Past Medical History  Diagnosis Date  . Coronary heart disease   . Hyperlipidemia   . HTN (hypertension)   . COPD (chronic obstructive pulmonary disease)   . PVD (peripheral vascular disease)   . Atrial fibrillation     converted to NSR 5/09 on pacerone  . Shortness of breath     on exertion  . Diabetes mellitus without complication   . Arthritis     fingers  . Anemia   . DJD (degenerative joint disease) of hip   . GI bleed     01/2013 while on warfarin with a hemoglobin of 5.8 (INR was 8)  . CHF (congestive heart failure)     EF 50-55%  . Carotid artery occlusion   . Complication of anesthesia   . PONV (postoperative nausea and vomiting)   . Cataracts, bilateral     Hx: of  .  Heart murmur   . Pneumonia     Hx: of  . Cancer of kidney   . Skin cancer 12/2013    Rt. ear  . Bladder cancer      Past Surgical History  Procedure Laterality Date  . Tonsillectomy      as child  . Colonoscopy N/A 02/23/2013    Procedure: COLONOSCOPY;  Surgeon: Inda Castle, MD;  Location: [facility];  Service: Endoscopy;  Laterality: N/A;  . Esophagogastroduodenoscopy N/A 02/23/2013    Procedure: ESOPHAGOGASTRODUODENOSCOPY (EGD);  Surgeon: Inda Castle, MD;  Location: [facility];  Service: Endoscopy;  Laterality: N/A;  . Hot  hemostasis N/A 05/04/2013    Procedure: HOT HEMOSTASIS (ARGON PLASMA COAGULATION/BICAP);  Surgeon: Inda Castle, MD;  Location: [facility];  Service: Endoscopy;  Laterality: N/A;  . Enteroscopy N/A 05/04/2013    Procedure: ENTEROSCOPY;  Surgeon: Inda Castle, MD;  Location: [facility];  Service: Endoscopy;  Laterality: N/A;  . Bypass graft  1997 ?  Marland Kitchen Coronary artery bypass graft      15-17 yrs  . Joint replacement Right      Hip 10-12 yrs  . Cardiac catheterization    . Endarterectomy Left 06/15/2013    Procedure: ENDARTERECTOMY CAROTID- LEFT;  Surgeon: Conrad Hepler, MD;  Location: [place];  Service: Vascular;  Laterality: Left;  . Patch angioplasty Left 06/15/2013    Procedure: PATCH ANGIOPLASTY using Vascu-Guard Vascular Patch;  Surgeon: Conrad , MD;  Location: [place];  Service: Vascular;  Laterality: Left;  . Kidney surgery  Aug. 2015    Chemo-Bladder   . Cataract extraction, bilateral  2015  . Skin cancer excision Right Feb. 2015  . Carotid endarterectomy Left 06-15-13    cea     Family History  Problem Relation Age of Onset  . Emphysema Father   . Heart disease Father   . Cancer Mother     unknwn type  . Tuberculosis Paternal Grandfather   . Peripheral vascular disease Sister   . Hyperlipidemia Brother   . Colon cancer Neg Hx      History   Social History  . Marital Status: Married    Spouse Name: N/A  . Number of Children: 3  . Years of Education: N/A   Occupational History  . retired- worked in a print shop x35 yrs    Social History Main Topics  . Smoking status: Light Tobacco Smoker -- 0.25 packs/day    Types: Cigarettes  . Smokeless tobacco: Never Used     Comment: Started smoking at age 59.  Tobacco info given 01/12/14  . Alcohol Use: No     Comment: Beer -haven't drank since Dec 2013  . Drug Use: No  . Sexual Activity: Not on file   Other Topics Concern  . Not on file   Social History Narrative      PHYSICAL EXAM   BP 102/60 mmHg   Pulse 85  Ht 5\' 5"  (1.651 m)  Wt 149 lb 8 oz (67.813 kg)  BMI 24.88 kg/m2 Constitutional: He is oriented to person, place, and time. He appears well-developed and well-nourished. No distress.  HENT: No nasal discharge.  Head: Normocephalic and atraumatic.  Eyes: Pupils are equal and round.  Neck: Normal range of motion. Neck supple. No JVD present. No thyromegaly present. There is a faint left carotid bruit. Cardiovascular: Normal rate, regular rhythm, normal heart sounds. Exam reveals no gallop and no friction rub. There is a 2/6 systolic  murmur at the left sternal border. Pulmonary/Chest: Effort normal and breath sounds normal. No stridor. No respiratory distress. He has no wheezes. He has no rales. He exhibits no tenderness.  Abdominal: Soft. Bowel sounds are normal. He exhibits no distension. There is no tenderness. There is no rebound and no guarding.  Musculoskeletal: Normal range of motion. He exhibits no edema and no tenderness.  Neurological: He is alert and oriented to person, place, and time. Coordination normal.  Skin: Skin is warm and dry. No rash noted. He is not diaphoretic. No erythema. No pallor.  Psychiatric: He has a normal mood and affect. His behavior is normal. Judgment and thought content normal.       EKG: Sinus  Rhythm  - frequent ectopic ventricular beat s  # VECs = 3 -Nonspecific ST depression   +   Nonspecific T-abnormality  -Nondiagnostic.   ABNORMAL    ASSESSMENT AND PLAN

## 2015-01-19 ENCOUNTER — Telehealth: Payer: Self-pay | Admitting: *Deleted

## 2015-01-19 ENCOUNTER — Encounter: Payer: Self-pay | Admitting: Cardiovascular Disease

## 2015-01-19 DIAGNOSIS — R188 Other ascites: Secondary | ICD-10-CM | POA: Insufficient documentation

## 2015-01-19 NOTE — Assessment & Plan Note (Signed)
The patient is maintaining in sinus rhythm with amiodarone. Given recurrent bleeding complications and recent diagnosis of liver cirrhosis, I agree that the risks of anticoagulation outweigh the benefits. There is also little benefit for aspirin in the setting of atrial fibrillation. Thus, I discontinued aspirin today. Given his liver cirrhosis, and possible amiodarone-induced liver toxicity, amiodarone should be discontinued. However, I think she is going to be at high risk for recurrent atrial fibrillation without this medication and thus I decided to keep him on it. I might decrease the dose to 100 mg once daily.

## 2015-01-19 NOTE — Telephone Encounter (Signed)
Pt calling just letting us know that he is taking  furosemide   40 mg,  Spironolactone 50 mg  Lactulose 10 mg   He forgot to bring his list from home and was told to just call them in.

## 2015-01-19 NOTE — Telephone Encounter (Signed)
Spoke with the patient regarding medications with dosing correction and Jason Larsen states he is taking one tablet daily on each medication.

## 2015-01-19 NOTE — Telephone Encounter (Signed)
Pt wife calling needing [place] to send [facility] clinic lab orders.  Please call when done so they can go draw the blood.

## 2015-01-19 NOTE — Assessment & Plan Note (Signed)
He has evidence of ascites by physical exam and has gained 20 pounds. I agree with furosemide and spironolactone. Recommend checking basic metabolic profile in one week.

## 2015-01-19 NOTE — Assessment & Plan Note (Signed)
He has no symptoms of angina. Continue medical therapy. 

## 2015-01-19 NOTE — Telephone Encounter (Signed)
Lab orders faxed to Dr. Baldemar Lenis as requested

## 2015-01-19 NOTE — Assessment & Plan Note (Signed)
Blood pressure is on the low side. I discontinued losartan.

## 2015-01-19 NOTE — Addendum Note (Signed)
Addended by: Tracie Harrier on: 01/19/2015 03:39 PM   Modules accepted: Orders

## 2015-01-19 NOTE — Telephone Encounter (Signed)
FYI the patient is taking Furosemide 40 mg one tablet daily, Spironolactone 50 mg one tablet daily and Lactulose 10 one tablet three times a day prn

## 2015-01-26 ENCOUNTER — Other Ambulatory Visit (INDEPENDENT_AMBULATORY_CARE_PROVIDER_SITE_OTHER): Payer: Commercial Managed Care - HMO

## 2015-01-26 ENCOUNTER — Other Ambulatory Visit: Payer: Self-pay

## 2015-01-26 DIAGNOSIS — I4891 Unspecified atrial fibrillation: Secondary | ICD-10-CM

## 2015-01-26 DIAGNOSIS — R188 Other ascites: Secondary | ICD-10-CM

## 2015-01-26 DIAGNOSIS — D62 Acute posthemorrhagic anemia: Secondary | ICD-10-CM

## 2015-01-26 DIAGNOSIS — E785 Hyperlipidemia, unspecified: Secondary | ICD-10-CM

## 2015-01-26 DIAGNOSIS — I2510 Atherosclerotic heart disease of native coronary artery without angina pectoris: Secondary | ICD-10-CM

## 2015-01-26 DIAGNOSIS — I10 Essential (primary) hypertension: Secondary | ICD-10-CM

## 2015-01-26 NOTE — Addendum Note (Signed)
Addended by: Anselm Pancoast on: 01/26/2015 09:27 AM   Modules accepted: Orders

## 2015-01-27 LAB — BASIC METABOLIC PANEL
BUN/Creatinine Ratio: 18 (ref 10–22)
BUN: 14 mg/dL (ref 8–27)
CO2: 27 mmol/L (ref 18–29)
CREATININE: 0.77 mg/dL (ref 0.76–1.27)
Calcium: 8.7 mg/dL (ref 8.6–10.2)
Chloride: 100 mmol/L (ref 97–108)
GFR calc non Af Amer: 89 mL/min/{1.73_m2} (ref >59)
GFR, EST AFRICAN AMERICAN: 103 mL/min/{1.73_m2} (ref >59)
GLUCOSE: 111 mg/dL — AB (ref 65–99)
Potassium: 4.7 mmol/L (ref 3.5–5.2)
Sodium: 142 mmol/L (ref 134–144)

## 2015-01-27 LAB — LIPID PANEL
CHOLESTEROL TOTAL: 92 mg/dL — AB (ref 100–199)
Chol/HDL Ratio: 2 ratio units (ref 0.0–5.0)
HDL: 46 mg/dL (ref >39)
LDL Calculated: 34 mg/dL (ref 0–99)
Triglycerides: 58 mg/dL (ref 0–149)
VLDL Cholesterol Cal: 12 mg/dL (ref 5–40)

## 2015-01-27 LAB — HEPATIC FUNCTION PANEL
ALBUMIN: 3.3 g/dL — AB (ref 3.5–4.8)
ALT: 28 IU/L (ref 0–44)
AST: 40 IU/L (ref 0–40)
Alkaline Phosphatase: 119 IU/L — ABNORMAL HIGH (ref 39–117)
BILIRUBIN, DIRECT: 0.26 mg/dL (ref 0.00–0.40)
Bilirubin Total: 0.7 mg/dL (ref 0.0–1.2)
Total Protein: 5.8 g/dL — ABNORMAL LOW (ref 6.0–8.5)

## 2015-01-29 ENCOUNTER — Ambulatory Visit: Payer: Commercial Managed Care - HMO | Admitting: Cardiovascular Disease

## 2015-01-31 ENCOUNTER — Telehealth: Payer: Self-pay | Admitting: *Deleted

## 2015-01-31 NOTE — Telephone Encounter (Signed)
Pt asking for blood work  Stated to her it is with the doctor and we would call when ready.

## 2015-01-31 NOTE — Telephone Encounter (Signed)
Gave patient preliminary results  Informed patient that I will call with full results when Dr. Fletcher Anon has reviewed them Patient verbalized understanding

## 2015-02-15 ENCOUNTER — Ambulatory Visit (INDEPENDENT_AMBULATORY_CARE_PROVIDER_SITE_OTHER): Payer: Commercial Managed Care - HMO | Admitting: Cardiovascular Disease

## 2015-02-15 ENCOUNTER — Ambulatory Visit: Payer: Commercial Managed Care - HMO | Admitting: Cardiovascular Disease

## 2015-02-15 ENCOUNTER — Encounter: Payer: Self-pay | Admitting: Cardiovascular Disease

## 2015-02-15 VITALS — BP 118/58 | HR 98 | Ht 64.0 in | Wt 123.5 lb

## 2015-02-15 DIAGNOSIS — R188 Other ascites: Secondary | ICD-10-CM

## 2015-02-15 DIAGNOSIS — I2510 Atherosclerotic heart disease of native coronary artery without angina pectoris: Secondary | ICD-10-CM

## 2015-02-15 DIAGNOSIS — I10 Essential (primary) hypertension: Secondary | ICD-10-CM

## 2015-02-15 DIAGNOSIS — I4891 Unspecified atrial fibrillation: Secondary | ICD-10-CM | POA: Diagnosis not present

## 2015-02-15 NOTE — Assessment & Plan Note (Signed)
He is maintaining in sinus rhythm on small dose amiodarone 100 mg once daily. He is not longer a candidate for anticoagulation due to liver cirrhosis, recurrent GI bleed and coagulopathy as manifested by his elevated INR.

## 2015-02-15 NOTE — Assessment & Plan Note (Signed)
This has resolved with spironolactone and furosemide. As a matter of fact, he appears to be volume depleted. The dose of furosemide was decreased today by Dr. Jimmie Molly to 20 mg once daily. He also had labs done today but CMP is still not available. The diuretics might need to be held temporarily if there is evidence of volume depletion on his blood work.

## 2015-02-15 NOTE — Assessment & Plan Note (Signed)
He has no symptoms of angina. Continue medical therapy. 

## 2015-02-15 NOTE — Progress Notes (Signed)
HPI  This is a 74 year old male who is here today for a followup visit. He has known history of coronary artery disease status post four-vessel CABG in 1997 at [facility], persistent atrial fibrillation maintained in sinus rhythm on amiodarone, COPD with tobacco use, hyperlipidemia, hypertension, recently diagnosed liver cirrhosis, carotid artery disease status post left carotid endarterectomy, type 2 diabetes and GI bleed in March of 2014 with supratherapeutic INR (8).  Most recent echocardiogram was done in March of 2014 which showed an ejection fraction of 50-55%, moderately dilated left atrium, mild to moderate MR and moderate pulmonary hypertension. The patient had recurrent GI bleed while on anticoagulation with warfarin. He was ultimately taken off anticoagulation altogether after he was diagnosed with liver cirrhosis. He had significant ascites recently with fluid overload. This improved significantly after the addition of spironolactone and furosemide. He lost more than 20 pounds since his last visit. Actually his weight is below his normal weight. He is feeling significantly better.     No Known Allergies   Current Outpatient Prescriptions on File Prior to Visit  Medication Sig Dispense Refill  . albuterol (PROVENTIL) (2.5 MG/3ML) 0.083% nebulizer solution Take 2.5 mg by nebulization every 4 (four) hours as needed for shortness of breath.     . allopurinol (ZYLOPRIM) 300 MG tablet Take 150 mg by mouth every morning. Gout    . amiodarone (PACERONE) 200 MG tablet Take 200 mg by mouth daily.    . carbonyl iron (FEOSOL) 45 MG TABS tablet Take 45 mg by mouth 2 (two) times daily with a meal.    . cyclobenzaprine (FLEXERIL) 10 MG tablet Take 10 mg by mouth as needed for muscle spasms.    Marland Kitchen diltiazem (DILACOR XR) 120 MG 24 hr capsule Take 1 capsule (120 mg total) by mouth every morning. 90 capsule 3  . docusate sodium (COLACE) 100 MG capsule Take 100 mg by mouth 2 (two) times daily.    .  formoterol (FORADIL) 12 MCG capsule for inhaler Place 1 capsule (12 mcg total) into inhaler and inhale 2 (two) times daily as needed for wheezing. 60 capsule 1  . gabapentin (NEURONTIN) 300 MG capsule Take 300 mg by mouth 3 (three) times daily as needed.    Marland Kitchen HYDROcodone-acetaminophen (NORCO/VICODIN) 5-325 MG per tablet Take 1 tablet by mouth every 4 (four) hours as needed for moderate pain.    Marland Kitchen lactulose (CEPHULAC) 10 G packet Take 10 g by mouth 3 (three) times daily.    Marland Kitchen omeprazole (PRILOSEC) 20 MG capsule Take 20 mg by mouth every morning.     . rifaximin (XIFAXAN) 550 MG TABS tablet Take 550 mg by mouth 2 (two) times daily.    . simvastatin (ZOCOR) 20 MG tablet Take 10 mg by mouth daily.    Marland Kitchen spironolactone (ALDACTONE) 50 MG tablet Take 50 mg by mouth daily.    Marland Kitchen tiotropium (SPIRIVA) 18 MCG inhalation capsule Place 1 capsule (18 mcg total) into inhaler and inhale daily after supper. 30 capsule 1   No current facility-administered medications on file prior to visit.     Past Medical History  Diagnosis Date  . Coronary heart disease   . Hyperlipidemia   . HTN (hypertension)   . COPD (chronic obstructive pulmonary disease)   . PVD (peripheral vascular disease)   . Atrial fibrillation     converted to NSR 5/09 on pacerone  . Shortness of breath     on exertion  . Diabetes mellitus without complication   .  Arthritis     fingers  . Anemia   . DJD (degenerative joint disease) of hip   . GI bleed     01/2013 while on warfarin with a hemoglobin of 5.8 (INR was 8)  . CHF (congestive heart failure)     EF 50-55%  . Carotid artery occlusion   . Complication of anesthesia   . PONV (postoperative nausea and vomiting)   . Cataracts, bilateral     Hx: of  . Heart murmur   . Pneumonia     Hx: of  . Cancer of kidney   . Skin cancer 12/2013    Rt. ear  . Bladder cancer      Past Surgical History  Procedure Laterality Date  . Tonsillectomy      as child  . Colonoscopy N/A  02/23/2013    Procedure: COLONOSCOPY;  Surgeon: Inda Castle, MD;  Location: [facility];  Service: Endoscopy;  Laterality: N/A;  . Esophagogastroduodenoscopy N/A 02/23/2013    Procedure: ESOPHAGOGASTRODUODENOSCOPY (EGD);  Surgeon: Inda Castle, MD;  Location: [facility];  Service: Endoscopy;  Laterality: N/A;  . Hot hemostasis N/A 05/04/2013    Procedure: HOT HEMOSTASIS (ARGON PLASMA COAGULATION/BICAP);  Surgeon: Inda Castle, MD;  Location: [facility];  Service: Endoscopy;  Laterality: N/A;  . Enteroscopy N/A 05/04/2013    Procedure: ENTEROSCOPY;  Surgeon: Inda Castle, MD;  Location: [facility];  Service: Endoscopy;  Laterality: N/A;  . Bypass graft  1997 ?  Marland Kitchen Coronary artery bypass graft      15-17 yrs  . Joint replacement Right      Hip 10-12 yrs  . Cardiac catheterization    . Endarterectomy Left 06/15/2013    Procedure: ENDARTERECTOMY CAROTID- LEFT;  Surgeon: Conrad Trappe, MD;  Location: [place];  Service: Vascular;  Laterality: Left;  . Patch angioplasty Left 06/15/2013    Procedure: PATCH ANGIOPLASTY using Vascu-Guard Vascular Patch;  Surgeon: Conrad [place], MD;  Location: [place];  Service: Vascular;  Laterality: Left;  . Kidney surgery  Aug. 2015    Chemo-Bladder   . Cataract extraction, bilateral  2015  . Skin cancer excision Right Feb. 2015  . Carotid endarterectomy Left 06-15-13    cea     Family History  Problem Relation Age of Onset  . Emphysema Father   . Heart disease Father   . Cancer Mother     unknwn type  . Tuberculosis Paternal Grandfather   . Peripheral vascular disease Sister   . Hyperlipidemia Brother   . Colon cancer Neg Hx      History   Social History  . Marital Status: Married    Spouse Name: N/A  . Number of Children: 3  . Years of Education: N/A   Occupational History  . retired- worked in a print shop x35 yrs    Social History Main Topics  . Smoking status: Light Tobacco Smoker -- 0.25 packs/day    Types: Cigarettes  .  Smokeless tobacco: Never Used     Comment: Started smoking at age 30.  Tobacco info given 01/12/14  . Alcohol Use: No     Comment: Beer -haven't drank since Dec 2013  . Drug Use: No  . Sexual Activity: Not on file   Other Topics Concern  . Not on file   Social History Narrative      PHYSICAL EXAM   BP 118/58 mmHg  Pulse 98  Ht 5\' 4"  (1.626 m)  Wt 123 lb  8 oz (56.019 kg)  BMI 21.19 kg/m2 Constitutional: He is oriented to person, place, and time. He appears well-developed and well-nourished. No distress.  HENT: No nasal discharge.  Head: Normocephalic and atraumatic.  Eyes: Pupils are equal and round.  Neck: Normal range of motion. Neck supple. No JVD present. No thyromegaly present. There is a faint left carotid bruit. Cardiovascular: Normal rate, regular rhythm, normal heart sounds. Exam reveals no gallop and no friction rub. There is a 2/6 systolic murmur at the left sternal border. Pulmonary/Chest: Effort normal and breath sounds normal. No stridor. No respiratory distress. He has no wheezes. He has no rales. He exhibits no tenderness.  Abdominal: Soft. Bowel sounds are normal. He exhibits no distension. There is no tenderness. There is no rebound and no guarding.  Musculoskeletal: Normal range of motion. He exhibits no edema and no tenderness.  Neurological: He is alert and oriented to person, place, and time. Coordination normal.  Skin: Skin is warm and dry. No rash noted. He is not diaphoretic. No erythema. No pallor.  Psychiatric: He has a normal mood and affect. His behavior is normal. Judgment and thought content normal.       EKG: Sinus  Rhythm  -First degree A-V block  PRi = 234 Possible left ventricular hypertrophy on non-voltage basis.   -ST depression   +   Negative T-waves  -Seen with left ventricular hypertrophy (strain) or digitalis effect -consider ischemia  consider  Anterior ischemia.    ASSESSMENT AND PLAN

## 2015-02-15 NOTE — Assessment & Plan Note (Signed)
Blood pressure is controlled on current medications. 

## 2015-02-15 NOTE — Patient Instructions (Addendum)
Continue same medications.  Follow up in 4 months.  

## 2015-02-28 ENCOUNTER — Encounter: Payer: Self-pay | Admitting: Family

## 2015-03-01 ENCOUNTER — Encounter: Payer: Self-pay | Admitting: Family

## 2015-03-01 ENCOUNTER — Ambulatory Visit (INDEPENDENT_AMBULATORY_CARE_PROVIDER_SITE_OTHER): Payer: Commercial Managed Care - HMO | Admitting: Family

## 2015-03-01 ENCOUNTER — Ambulatory Visit (HOSPITAL_COMMUNITY)
Admission: RE | Admit: 2015-03-01 | Discharge: 2015-03-01 | Disposition: A | Discharge status: Home / Self Care | Payer: Commercial Managed Care - HMO | Source: Ambulatory Visit | Attending: Family | Admitting: Family

## 2015-03-01 VITALS — BP 112/69 | HR 74 | Resp 18 | Ht 65.0 in | Wt 121.0 lb

## 2015-03-01 DIAGNOSIS — Z48812 Encounter for surgical aftercare following surgery on the circulatory system: Secondary | ICD-10-CM | POA: Diagnosis not present

## 2015-03-01 DIAGNOSIS — Z789 Other specified health status: Secondary | ICD-10-CM

## 2015-03-01 DIAGNOSIS — Z720 Tobacco use: Secondary | ICD-10-CM | POA: Diagnosis not present

## 2015-03-01 DIAGNOSIS — I6523 Occlusion and stenosis of bilateral carotid arteries: Secondary | ICD-10-CM | POA: Diagnosis not present

## 2015-03-01 DIAGNOSIS — F17200 Nicotine dependence, unspecified, uncomplicated: Secondary | ICD-10-CM

## 2015-03-01 DIAGNOSIS — L819 Disorder of pigmentation, unspecified: Secondary | ICD-10-CM | POA: Insufficient documentation

## 2015-03-01 DIAGNOSIS — Z9889 Other specified postprocedural states: Secondary | ICD-10-CM | POA: Diagnosis not present

## 2015-03-01 NOTE — Patient Instructions (Signed)
Stroke Prevention Some medical conditions and behaviors are associated with an increased chance of having a stroke. You may prevent a stroke by making healthy choices and managing medical conditions. HOW CAN I REDUCE MY RISK OF HAVING A STROKE?   Stay physically active. Get at least 30 minutes of activity on most or all days.  Do not smoke. It may also be helpful to avoid exposure to secondhand smoke.  Limit alcohol use. Moderate alcohol use is considered to be:  No more than 2 drinks per day for men.  No more than 1 drink per day for nonpregnant women.  Eat healthy foods. This involves:  Eating 5 or more servings of fruits and vegetables a day.  Making dietary changes that address high blood pressure (hypertension), high cholesterol, diabetes, or obesity.  Manage your cholesterol levels.  Making food choices that are high in fiber and low in saturated fat, trans fat, and cholesterol may control cholesterol levels.  Take any prescribed medicines to control cholesterol as directed by your health care provider.  Manage your diabetes.  Controlling your carbohydrate and sugar intake is recommended to manage diabetes.  Take any prescribed medicines to control diabetes as directed by your health care provider.  Control your hypertension.  Making food choices that are low in salt (sodium), saturated fat, trans fat, and cholesterol is recommended to manage hypertension.  Take any prescribed medicines to control hypertension as directed by your health care provider.  Maintain a healthy weight.  Reducing calorie intake and making food choices that are low in sodium, saturated fat, trans fat, and cholesterol are recommended to manage weight.  Stop drug abuse.  Avoid taking birth control pills.  Talk to your health care provider about the risks of taking birth control pills if you are over 35 years old, smoke, get migraines, or have ever had a blood clot.  Get evaluated for sleep  disorders (sleep apnea).  Talk to your health care provider about getting a sleep evaluation if you snore a lot or have excessive sleepiness.  Take medicines only as directed by your health care provider.  For some people, aspirin or blood thinners (anticoagulants) are helpful in reducing the risk of forming abnormal blood clots that can lead to stroke. If you have the irregular heart rhythm of atrial fibrillation, you should be on a blood thinner unless there is a good reason you cannot take them.  Understand all your medicine instructions.  Make sure that other conditions (such as anemia or atherosclerosis) are addressed. SEEK IMMEDIATE MEDICAL CARE IF:   You have sudden weakness or numbness of the face, arm, or leg, especially on one side of the body.  Your face or eyelid droops to one side.  You have sudden confusion.  You have trouble speaking (aphasia) or understanding.  You have sudden trouble seeing in one or both eyes.  You have sudden trouble walking.  You have dizziness.  You have a loss of balance or coordination.  You have a sudden, severe headache with no known cause.  You have new chest pain or an irregular heartbeat. Any of these symptoms may represent a serious problem that is an emergency. Do not wait to see if the symptoms will go away. Get medical help at once. Call your local emergency services (911 in [place] Do not drive yourself to the hospital. Document Released: 12/04/2004 Document Revised: 03/13/2014 Document Reviewed: 04/29/2013 ExitCare Patient Information 2015 [employer]. This information is not intended to replace advice given   to you by your health care provider. Make sure you discuss any questions you have with your health care provider.    Smoking Cessation Quitting smoking is important to your health and has many advantages. However, it is not always easy to quit since nicotine is a very addictive drug. Oftentimes, people try 3 times or  more before being able to quit. This document explains the best ways for you to prepare to quit smoking. Quitting takes hard work and a lot of effort, but you can do it. ADVANTAGES OF QUITTING SMOKING  You will live longer, feel better, and live better.  Your body will feel the impact of quitting smoking almost immediately.  Within 20 minutes, blood pressure decreases. Your pulse returns to its normal level.  After 8 hours, carbon monoxide levels in the blood return to normal. Your oxygen level increases.  After 24 hours, the chance of having a heart attack starts to decrease. Your breath, hair, and body stop smelling like smoke.  After 48 hours, damaged nerve endings begin to recover. Your sense of taste and smell improve.  After 72 hours, the body is virtually free of nicotine. Your bronchial tubes relax and breathing becomes easier.  After 2 to 12 weeks, lungs can hold more air. Exercise becomes easier and circulation improves.  The risk of having a heart attack, stroke, cancer, or lung disease is greatly reduced.  After 1 year, the risk of coronary heart disease is cut in half.  After 5 years, the risk of stroke falls to the same as a nonsmoker.  After 10 years, the risk of lung cancer is cut in half and the risk of other cancers decreases significantly.  After 15 years, the risk of coronary heart disease drops, usually to the level of a nonsmoker.  If you are pregnant, quitting smoking will improve your chances of having a healthy baby.  The people you live with, especially any children, will be healthier.  You will have extra money to spend on things other than cigarettes. QUESTIONS TO THINK ABOUT BEFORE ATTEMPTING TO QUIT You may want to talk about your answers with your health care provider.  Why do you want to quit?  If you tried to quit in the past, what helped and what did not?  What will be the most difficult situations for you after you quit? How will you plan to  handle them?  Who can help you through the tough times? Your family? Friends? A health care provider?  What pleasures do you get from smoking? What ways can you still get pleasure if you quit? Here are some questions to ask your health care provider:  How can you help me to be successful at quitting?  What medicine do you think would be best for me and how should I take it?  What should I do if I need more help?  What is smoking withdrawal like? How can I get information on withdrawal? GET READY  Set a quit date.  Change your environment by getting rid of all cigarettes, ashtrays, matches, and lighters in your home, car, or work. Do not let people smoke in your home.  Review your past attempts to quit. Think about what worked and what did not. GET SUPPORT AND ENCOURAGEMENT You have a better chance of being successful if you have help. You can get support in many ways.  Tell your family, friends, and coworkers that you are going to quit and need their support. Ask them   not to smoke around you.  Get individual, group, or telephone counseling and support. Programs are available at local hospitals and health centers. Call your local health department for information about programs in your area.  Spiritual beliefs and practices may help some smokers quit.  Download a "quit meter" on your computer to keep track of quit statistics, such as how long you have gone without smoking, cigarettes not smoked, and money saved.  Get a self-help book about quitting smoking and staying off tobacco. LEARN NEW SKILLS AND BEHAVIORS  Distract yourself from urges to smoke. Talk to someone, go for a walk, or occupy your time with a task.  Change your normal routine. Take a different route to work. Drink tea instead of coffee. Eat breakfast in a different place.  Reduce your stress. Take a hot bath, exercise, or read a book.  Plan something enjoyable to do every day. Reward yourself for not  smoking.  Explore interactive web-based programs that specialize in helping you quit. GET MEDICINE AND USE IT CORRECTLY Medicines can help you stop smoking and decrease the urge to smoke. Combining medicine with the above behavioral methods and support can greatly increase your chances of successfully quitting smoking.  Nicotine replacement therapy helps deliver nicotine to your body without the negative effects and risks of smoking. Nicotine replacement therapy includes nicotine gum, lozenges, inhalers, nasal sprays, and skin patches. Some may be available over-the-counter and others require a prescription.  Antidepressant medicine helps people abstain from smoking, but how this works is unknown. This medicine is available by prescription.  Nicotinic receptor partial agonist medicine simulates the effect of nicotine in your brain. This medicine is available by prescription. Ask your health care provider for advice about which medicines to use and how to use them based on your health history. Your health care provider will tell you what side effects to look out for if you choose to be on a medicine or therapy. Carefully read the information on the package. Do not use any other product containing nicotine while using a nicotine replacement product.  RELAPSE OR DIFFICULT SITUATIONS Most relapses occur within the first 3 months after quitting. Do not be discouraged if you start smoking again. Remember, most people try several times before finally quitting. You may have symptoms of withdrawal because your body is used to nicotine. You may crave cigarettes, be irritable, feel very hungry, cough often, get headaches, or have difficulty concentrating. The withdrawal symptoms are only temporary. They are strongest when you first quit, but they will go away within 10-14 days. To reduce the chances of relapse, try to:  Avoid drinking alcohol. Drinking lowers your chances of successfully quitting.  Reduce the  amount of caffeine you consume. Once you quit smoking, the amount of caffeine in your body increases and can give you symptoms, such as a rapid heartbeat, sweating, and anxiety.  Avoid smokers because they can make you want to smoke.  Do not let weight gain distract you. Many smokers will gain weight when they quit, usually less than 10 pounds. Eat a healthy diet and stay active. You can always lose the weight gained after you quit.  Find ways to improve your mood other than smoking. FOR MORE INFORMATION  www.smokefree.gov  Document Released: 10/21/2001 Document Revised: 03/13/2014 Document Reviewed: 02/05/2012 ExitCare Patient Information 2015 [employer]. This information is not intended to replace advice given to you by your health care provider. Make sure you discuss any questions you have with your health   care provider.    Smoking Cessation, Tips for Success If you are ready to quit smoking, congratulations! You have chosen to help yourself be healthier. Cigarettes bring nicotine, tar, carbon monoxide, and other irritants into your body. Your lungs, heart, and blood vessels will be able to work better without these poisons. There are many different ways to quit smoking. Nicotine gum, nicotine patches, a nicotine inhaler, or nicotine nasal spray can help with physical craving. Hypnosis, support groups, and medicines help break the habit of smoking. WHAT THINGS CAN I DO TO MAKE QUITTING EASIER?  Here are some tips to help you quit for good:  Pick a date when you will quit smoking completely. Tell all of your friends and family about your plan to quit on that date.  Do not try to slowly cut down on the number of cigarettes you are smoking. Pick a quit date and quit smoking completely starting on that day.  Throw away all cigarettes.   Clean and remove all ashtrays from your home, work, and car.  On a card, write down your reasons for quitting. Carry the card with you and read it  when you get the urge to smoke.  Cleanse your body of nicotine. Drink enough water and fluids to keep your urine clear or pale yellow. Do this after quitting to flush the nicotine from your body.  Learn to predict your moods. Do not let a bad situation be your excuse to have a cigarette. Some situations in your life might tempt you into wanting a cigarette.  Never have "just one" cigarette. It leads to wanting another and another. Remind yourself of your decision to quit.  Change habits associated with smoking. If you smoked while driving or when feeling stressed, try other activities to replace smoking. Stand up when drinking your coffee. Brush your teeth after eating. Sit in a different chair when you read the paper. Avoid alcohol while trying to quit, and try to drink fewer caffeinated beverages. Alcohol and caffeine may urge you to smoke.  Avoid foods and drinks that can trigger a desire to smoke, such as sugary or spicy foods and alcohol.  Ask people who smoke not to smoke around you.  Have something planned to do right after eating or having a cup of coffee. For example, plan to take a walk or exercise.  Try a relaxation exercise to calm you down and decrease your stress. Remember, you may be tense and nervous for the first 2 weeks after you quit, but this will pass.  Find new activities to keep your hands busy. Play with a pen, coin, or rubber band. Doodle or draw things on paper.  Brush your teeth right after eating. This will help cut down on the craving for the taste of tobacco after meals. You can also try mouthwash.   Use oral substitutes in place of cigarettes. Try using lemon drops, carrots, cinnamon sticks, or chewing gum. Keep them handy so they are available when you have the urge to smoke.  When you have the urge to smoke, try deep breathing.  Designate your home as a nonsmoking area.  If you are a heavy smoker, ask your health care provider about a prescription for  nicotine chewing gum. It can ease your withdrawal from nicotine.  Reward yourself. Set aside the cigarette money you save and buy yourself something nice.  Look for support from others. Join a support group or smoking cessation program. Ask someone at home or at work   to help you with your plan to quit smoking.  Always ask yourself, "Do I need this cigarette or is this just a reflex?" Tell yourself, "Today, I choose not to smoke," or "I do not want to smoke." You are reminding yourself of your decision to quit.  Do not replace cigarette smoking with electronic cigarettes (commonly called e-cigarettes). The safety of e-cigarettes is unknown, and some may contain harmful chemicals.  If you relapse, do not give up! Plan ahead and think about what you will do the next time you get the urge to smoke. HOW WILL I FEEL WHEN I QUIT SMOKING? You may have symptoms of withdrawal because your body is used to nicotine (the addictive substance in cigarettes). You may crave cigarettes, be irritable, feel very hungry, cough often, get headaches, or have difficulty concentrating. The withdrawal symptoms are only temporary. They are strongest when you first quit but will go away within 10-14 days. When withdrawal symptoms occur, stay in control. Think about your reasons for quitting. Remind yourself that these are signs that your body is healing and getting used to being without cigarettes. Remember that withdrawal symptoms are easier to treat than the major diseases that smoking can cause.  Even after the withdrawal is over, expect periodic urges to smoke. However, these cravings are generally short lived and will go away whether you smoke or not. Do not smoke! WHAT RESOURCES ARE AVAILABLE TO HELP ME QUIT SMOKING? Your health care provider can direct you to community resources or hospitals for support, which may include:  Group support.  Education.  Hypnosis.  Therapy. Document Released: 07/25/2004 Document  Revised: 03/13/2014 Document Reviewed: 04/14/2013 ExitCare Patient Information 2015 [employer]. This information is not intended to replace advice given to you by your health care provider. Make sure you discuss any questions you have with your health care provider.  

## 2015-03-01 NOTE — Progress Notes (Signed)
Established Carotid Patient   History of Present Illness  Jason Larsen is a 74 y.o. male patient of Dr. 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  He returns today for follow up. He has had some left jawline numbness since the surgery. The patient has had no TIA or CVA sx. He does not feel the jawline numbness affects ADL.   The patient denies amaurosis fugax or monocular blindness. The patient denies facial drooping. Pt. denies hemiplegia. The patient denies receptive or expressive aphasia.   Pt reports New Medical or Surgical History: diagnosed with cirrhosis; wife states he stopped ETOH use about 2011. Was hospitalized several times in the last 6 months. It was also found that he had GI bleeding with low H/H. Pt states he also fell December 2015.   He has had cataract extraction, finished chemotherapy for bladder tumors.  He denies claudication symptoms in legs with walking, he no longer walks much due back pain since falling December 2015, heating pad to back helps.   Pt Diabetic: No, once his blood sugar was elevated during a hospitalization Pt smoker: smoker (1/2 ppd, started at age 68 yrs)  Pt meds include: Statin : Yes ASA: No., stopped since GI bleed diagnosed.  Other anticoagulants/antiplatelets: coumadin for atrial fib was stopped since GI bleed diagnosed February 2016   Past Medical History  Diagnosis Date  . Coronary heart disease   . Hyperlipidemia   . HTN (hypertension)   . COPD (chronic obstructive pulmonary disease)   . PVD (peripheral vascular disease)   . Atrial fibrillation     converted to NSR 5/09 on pacerone  . Shortness of breath     on exertion  . Diabetes mellitus without complication   . Arthritis     fingers  . Anemia   . DJD (degenerative joint disease) of hip   . GI bleed     01/2013 while on warfarin with a hemoglobin of 5.8 (INR was 8)  . CHF (congestive heart failure)     EF 50-55%  . Carotid artery occlusion    . Complication of anesthesia   . PONV (postoperative nausea and vomiting)   . Cataracts, bilateral     Hx: of  . Heart murmur   . Pneumonia     Hx: of  . Cancer of kidney   . Skin cancer 12/2013    Rt. ear  . Bladder cancer   . Fall at home Dec. 2015    Social History History  Substance Use Topics  . Smoking status: Light Tobacco Smoker -- 0.25 packs/day    Types: Cigarettes  . Smokeless tobacco: Never Used     Comment: Started smoking at age 17.  Tobacco info given 01/12/14  . Alcohol Use: No     Comment: Beer -haven't drank since Dec 2013    Family History Family History  Problem Relation Age of Onset  . Emphysema Father   . Heart disease Father   . Cancer Mother     unknwn type  . Tuberculosis Paternal Grandfather   . Peripheral vascular disease Sister   . Hyperlipidemia Brother   . Colon cancer Neg Hx     Surgical History Past Surgical History  Procedure Laterality Date  . Tonsillectomy      as child  . Colonoscopy N/A 02/23/2013    Procedure: COLONOSCOPY;  Surgeon: Inda Castle, MD;  Location: [facility];  Service: Endoscopy;  Laterality: N/A;  .  Esophagogastroduodenoscopy N/A 02/23/2013    Procedure: ESOPHAGOGASTRODUODENOSCOPY (EGD);  Surgeon: Inda Castle, MD;  Location: [facility];  Service: Endoscopy;  Laterality: N/A;  . Hot hemostasis N/A 05/04/2013    Procedure: HOT HEMOSTASIS (ARGON PLASMA COAGULATION/BICAP);  Surgeon: Inda Castle, MD;  Location: [facility];  Service: Endoscopy;  Laterality: N/A;  . Enteroscopy N/A 05/04/2013    Procedure: ENTEROSCOPY;  Surgeon: Inda Castle, MD;  Location: [facility];  Service: Endoscopy;  Laterality: N/A;  . Bypass graft  1997 ?  Marland Kitchen Coronary artery bypass graft      15-17 yrs  . Joint replacement Right      Hip 10-12 yrs  . Cardiac catheterization    . Endarterectomy Left 06/15/2013    Procedure: ENDARTERECTOMY CAROTID- LEFT;  Surgeon: Conrad Robin [place], MD;  Location: [facility];  Service: Vascular;   Laterality: Left;  . Patch angioplasty Left 06/15/2013    Procedure: PATCH ANGIOPLASTY using Vascu-Guard Vascular Patch;  Surgeon: Conrad Wabasso, MD;  Location: [facility];  Service: Vascular;  Laterality: Left;  . Kidney surgery  Aug. 2015    Chemo-Bladder   . Cataract extraction, bilateral  2015  . Skin cancer excision Right Feb. 2015    Ear  . Carotid endarterectomy Left 06-15-13    cea  . Eye surgery      No Known Allergies  Current Outpatient Prescriptions  Medication Sig Dispense Refill  . albuterol (PROVENTIL) (2.5 MG/3ML) 0.083% nebulizer solution Take 2.5 mg by nebulization every 4 (four) hours as needed for shortness of breath.     . allopurinol (ZYLOPRIM) 300 MG tablet Take 150 mg by mouth every morning. Gout    . diltiazem (DILACOR XR) 120 MG 24 hr capsule Take 1 capsule (120 mg total) by mouth every morning. 90 capsule 3  . ferrous sulfate 325 (65 FE) MG tablet Take 325 mg by mouth daily with breakfast.    . formoterol (FORADIL) 12 MCG capsule for inhaler Place 1 capsule (12 mcg total) into inhaler and inhale 2 (two) times daily as needed for wheezing. 60 capsule 1  . furosemide (LASIX) 20 MG tablet Take 20 mg by mouth daily.    Marland Kitchen omeprazole (PRILOSEC) 20 MG capsule Take 20 mg by mouth every morning.     Marland Kitchen spironolactone (ALDACTONE) 50 MG tablet Take 50 mg by mouth daily.    Marland Kitchen tiotropium (SPIRIVA) 18 MCG inhalation capsule Place 1 capsule (18 mcg total) into inhaler and inhale daily after supper. 30 capsule 1  . amiodarone (PACERONE) 200 MG tablet Take 100 mg by mouth daily.     Marland Kitchen atorvastatin (LIPITOR) 10 MG tablet     . carbonyl iron (FEOSOL) 45 MG TABS tablet Take 45 mg by mouth 2 (two) times daily with a meal.    . cyclobenzaprine (FLEXERIL) 10 MG tablet Take 10 mg by mouth as needed for muscle spasms.    Marland Kitchen docusate sodium (COLACE) 100 MG capsule Take 100 mg by mouth 2 (two) times daily.    Marland Kitchen gabapentin (NEURONTIN) 300 MG capsule Take 300 mg by mouth 3 (three) times daily as  needed.    Marland Kitchen HYDROcodone-acetaminophen (NORCO/VICODIN) 5-325 MG per tablet Take 1 tablet by mouth every 4 (four) hours as needed for moderate pain.    Marland Kitchen lactulose (CEPHULAC) 10 G packet Take 10 g by mouth 3 (three) times daily.    . rifaximin (XIFAXAN) 550 MG TABS tablet Take 550 mg by mouth 2 (two) times daily.    Marland Kitchen  simvastatin (ZOCOR) 20 MG tablet Take 10 mg by mouth daily.     No current facility-administered medications for this visit.    Review of Systems : See HPI for pertinent positives and negatives.  Physical Examination  Filed Vitals:   03/01/15 0929 03/01/15 0932  BP: 115/70 112/69  Pulse: 90 74  Resp:  18  Height:  5\' 5"  (1.651 m)  Weight:  121 lb (54.885 kg)  SpO2:  98%   Body mass index is 20.14 kg/(m^2).  General: A&O x 3, WDWN  Eyes: PERRLA Pulmonary: Sym exp, little air movement in all fields, transient wheezes in all fields, frequent moist cough. Transient rales both bases.. Cardiac: RRR, Nl S1, S2, no Murmur detected  Vascular:  Vessel  Right  Left   Radial  Palpable  Palpable   Brachial  Palpable  Palpable   Carotid  Palpable, with soft bruit  Palpable, with soft bruit   Aorta  Not palpable  N/A   Femoral Not palpable palpable  Popliteal  Not palpable  Not palpable   PT  Not Palpable, not Dopplerable   Palpable   DP  Not Palpable, not Dopplerable  Not Palpable    Gastrointestinal: soft, NTND, -G/R, - HSM, - palpable masses, - CVAT B  Musculoskeletal: M/S 5/5 throughout except 4/5 left LE , Extremities without ischemic changes. Marked kyphosis. Skin: Rhinophyma Neurologic: CN 2-12 intact , Pain and light touch intact in extremities except L jawline: decreased sensation, he Motor exam as listed above. Intentional head bobbing when he talks.         Non-Invasive Vascular Imaging CAROTID DUPLEX 03/01/2015   CEREBROVASCULAR DUPLEX EVALUATION    INDICATION: Carotid artery disease    PREVIOUS INTERVENTION(S): Left  carotid endarterectomy 06/15/2013    DUPLEX EXAM: Carotid duplex    RIGHT  LEFT  Peak Systolic Velocities (cm/s) End Diastolic Velocities (cm/s) Plaque LOCATION Peak Systolic Velocities (cm/s) End Diastolic Velocities (cm/s) Plaque  58 14 HT CCA PROXIMAL 82 7 HT  58 13 HT CCA MID 104 23 HT  78 25 HT CCA DISTAL 70 15 -  157 15 CP ECA 97 11 CP  223 62 CP ICA PROXIMAL 85 26 -  140 37 HT ICA MID 87 28 -  120 28 - ICA DISTAL 92 25 -    3.8 ICA / CCA Ratio (PSV) N/A  Antegrade Vertebral Flow Antegrade  191 Brachial Systolic Pressure (mmHg) 478  Triphasic Brachial Artery Waveforms Biphasic    Plaque Morphology:  HM = Homogeneous, HT = Heterogeneous, CP = Calcific Plaque, SP = Smooth Plaque, IP = Irregular Plaque  ADDITIONAL FINDINGS:     IMPRESSION: 1. 40 - 59%, (higher end or range) right internal carotid artery stenosis, calcific plaque may obscure higher velocity. 2. Patent left carotid endarterectomy site without evidence for restenosis.    Compared to the previous exam:  Increase of velocity on the right      Assessment: KADARIUS CUFFE is a 74 y.o. male who is s/p L CEA on 06/15/13. He has no history of stroke or TIA. Today's carotid Duplex suggests 40 - 59%, (higher end or range) right internal carotid artery stenosis,  patent left carotid endarterectomy site without evidence for restenosis. Increase of velocity on the right compared to previous Duplex.   Discussed with Dr. Oneida Alar non Dopplerable right foot pulses, is asymptomatic in the right LE. Will check ABI's on his return since he is asymptomatic in the right LE, right foot is warm  and pink.  Unfortunately he continues to smoke, see Plan.   Plan:  The patient was counseled re smoking cessation and given several free resources re smoking cessation.  Follow-up in 6 months with Carotid Duplex and ABI's.   I discussed in depth with the patient the nature of atherosclerosis, and emphasized the importance of maximal medical  management including strict control of blood pressure, blood glucose, and lipid levels, obtaining regular exercise, and cessation of smoking.  The patient is aware that without maximal medical management the underlying atherosclerotic disease process will progress, limiting the benefit of any interventions. The patient was given information about stroke prevention and what symptoms should prompt the patient to seek immediate medical care. Thank you for allowing [place] to participate in this patient's care.  Clemon Chambers, RN, MSN, FNP-C [facility] Office: [phone]  Clinic Physician: Oneida Alar  03/01/2015 9:53 AM

## 2015-03-02 NOTE — Consult Note (Signed)
Chief Complaint:  Subjective/Chief Complaint patient feeling some beter today, closer to baseline of shortness of breath.  no n or vomiting, no abdominal pain.   VITAL SIGNS/ANCILLARY NOTES: **Vital Signs.:   18-Mar-14 14:02  Vital Signs Type Routine  Temperature Temperature (F) 97.7  Celsius 36.5  Temperature Source oral  Pulse Pulse 80  Respirations Respirations 20  Systolic BP Systolic BP 291  Diastolic BP (mmHg) Diastolic BP (mmHg) 59  Mean BP 75  Pulse Ox % Pulse Ox % 99  Pulse Ox Activity Level  At rest  Oxygen Delivery 3L   Brief Assessment:  Cardiac Regular   Respiratory occasional wheeze, rhonchi   Gastrointestinal details normal Soft  Nontender  Nondistended  No masses palpable  Bowel sounds normal   Lab Results: Routine Coag:  18-Mar-14 09:08   Prothrombin  27.0  INR 2.6 (INR reference interval applies to patients on anticoagulant therapy. A single INR therapeutic range for coumarins is not optimal for all indications; however, the suggested range for most indications is 2.0 - 3.0. Exceptions to the INR Reference Range may include: Prosthetic heart valves, acute myocardial infarction, prevention of myocardial infarction, and combinations of aspirin and anticoagulant. The need for a higher or lower target INR must be assessed individually. Reference: The Pharmacology and Management of the Vitamin K  antagonists: the seventh ACCP Conference on Antithrombotic and Thrombolytic Therapy. BTYOM.6004 Sept:126 (3suppl): [id number]. A HCT value >55% may artifactually increase the PT.  In one study,  the increase was an average of 25%. Reference:  "Effect on Routine and Special Coagulation Testing Values of Citrate Anticoagulant Adjustment in Patients with High HCT Values." American Journal of Clinical Pathology 2006;126:400-405.)  Routine Hem:  18-Mar-14 09:08   Hemoglobin (CBC)  7.9 (Result(s) reported on 25 Jan 2013 at 09:31AM.)   Assessment/Plan:   Assessment/Plan:  Assessment 1) anemia, reported black stools prior to admission.  supratheraputic PT.   2) severe respiratory diesase and cardiac disease-appreciate pulmonary and cardiac consults.   Plan 1) PT still elevated. some improvement in resp status.  still high risk for sedated proceedure. Will need anesthesia to assist with egd when clinically feasible.  PT needs to be about 1.3.  following.   Electronic Signatures: Loistine Simas (MD)  (Signed 18-Mar-14 21:00)  Authored: Chief Complaint, VITAL SIGNS/ANCILLARY NOTES, Brief Assessment, Lab Results, Assessment/Plan   Last Updated: 18-Mar-14 21:00 by Loistine Simas (MD)

## 2015-03-02 NOTE — Discharge Summary (Signed)
PATIENT NAME:  Jason Larsen, Jason Larsen MR#:  [id number] DATE OF BIRTH:  02-Feb-1941  DATE OF ADMISSION:  01/24/2013 DATE OF DISCHARGE:  01/28/2013  REASON FOR ADMISSION: Low hemoglobin count.  PRIMARY CARE PHYSICIAN: Apolonio Schneiders, MD  GASTROINTESTINAL DOCTOR: Loistine Simas, MD  PULMONARY: Mariane Duval, MD   CARDIOVASCULAR: Neoma Laming, MD    PRIMARY CARDIOLOGIST:  Dr. Claiborne Billings    MEDICATIONS AT DISCHARGE: Albuterol 2.5/58mL inhalation as needed for cough or wheezing, allopurinol 300 mg take 1/2 tablet once daily, mometasone  110 mcg inhaled 2 puffs once a day, aspirin 81 mg once daily, metoprolol 50 mg 0.5 twice daily, Spiriva 18 mcg once a day, Zocor 20 mg take 1//2 tablet once at bedtime, Feosol 1 once a day, formoterol 12 mcg 1 inhaled every 12 hours, amiodarone 200 mg twice daily, diltiazem 120 mg/24 hours once a day,  Lasix 40 mg once daily, losartan 25 mg take 1/2 tablet once a day, metformin 500 mg take 2 tablets in the morning and 1 at night, prednisone taper decrease 10 mg every 2 days until gone, azithromycin 250 mg once a day for another 5 days, Protonix 40 mg extended-release twice daily.   Coumadin has been stopped, and he is not going to be able to resume this medication until evaluated by Cardiology, either Dr. Humphrey Rolls or Dr. Claiborne Billings, and also by GI, Dr. Gustavo Lah.  For now, we are going to continue with aspirin 81 mg once daily for the prophylaxis of his atrial fibrillation.   DISCHARGE DIAGNOSES:  1. Acute severe anemia.  2. Upper gastrointestinal bleeding.  3. Status post transfusion of 3 units of packed red blood cells.  4. Chronic atrial fibrillation.  5. Acute on chronic respiratory failure.  6. Chronic obstructive pulmonary disease.  7. Chronic diastolic congestive heart failure, compensated.  8. Pulmonary hypertension with a pressure of 51 mmHg.  IMPORTANT LABORATORY AND RADIOLOGICAL DATA:  Blood sugars in 200s, creatinine 1.06, BUN 28, LFTs with mild elevation of AST at 41.  Hemoglobin of 5.7 at admission, at discharge after transfusions 8.4, white count was 7.6, platelets of 300, at discharge 241.  INR 2.2 at discharge, on admission was 4.0.   EKG: Atrial fibrillation, which is chronic.  Chest x-ray: Low-grade CHF, underlying COPD, no focal pneumonia.   HOSPITAL COURSE: The patient is a very nice 74 year old gentleman who was admitted, sent by Dr. Apolonio Schneiders, MD's office. He has history of chronic respiratory failure for which he uses 2.5 liters of oxygen in nasal cannula. He has history of systolic heart failure which is chronic and compensated, a history of atrial fibrillation on Coumadin, and he came to the Emergency Room after being sent by Dr. Arline Asp due to feeling very weak. Hemoglobin was checked, and 5.8 was the result at [facility], for which he was sent directly to the Emergency Room. He has been having on and off melanotic stools without any significant GERD symptoms, for which GI was consulted. Apparently the patient has been having supratherapeutic INRs; at some point he was up to 8 within the last week, and at admission he was 4.0, which this could be most likely the cause of the GI bleeding.   Per  Dr. Marton Redwood consultation, he believes this is a event related to his coumadin.  He required a  consultation by Cardiology and Pulmonary for clearance for procedure. Those consultations were done by Dr. Neoma Laming and Dr. Mortimer Fries, and the patient is always a high risk due  to his pulmonary disease.  As far as cardiovascular, he has some risk, but Dr. Humphrey Rolls thought that it was acceptable.  The patient has been taken off Coumadin for now, and vitamin K was given to treat the INR and have some time to do the evaluation with the EGD. Unfortunately, his INR decreased really slowly.  On further discussions with Dr. Gustavo Lah, he anticipated the patient might benefit just from having an upper GI , and if the upper GI did not show anything just to evaluate  outpatient with EGD and future possible colonoscopy.  The upper GI was showing no signs of ulcers or any acute findings, for which the patient was discharged in good condition with a hemoglobin that was now 8.4, no signs of bleeding. The patient was advised that he needs to stop the Coumadin. He needs to continue the aspirin, only 81 mg a day for now, until he sees his cardiologist.  He goes to Dr. Claiborne Billings, but Dr. Humphrey Rolls is also available to give his services to this patient in case he is not able to see Dr. Claiborne Billings.   The patient is discharged in good condition without any major problems. As far as his other medical problems, his CHF, COPD were compensated. He was treated with his slight exacerbation of COPD.  He was given broad-spectrum antibiotics.  He is going to be discharged home on azithromycin only.   As far as his chronic atrial fibrillation, acceptable heart rate on amiodarone, will continue amiodarone.  Given his diastolic heart failure, the patient was doing okay. He did not have any major problems.  He had an echo Doppler that showed left ventricular ejection fraction of 50% to 55%.  The patient does not have systolic failure, only diastolic.  He has a moderately dilated left atrium and mild mitral valve regurgitation.  He has moderate elevation of the pulmonary pressures.   The patient is discharged in good condition.   TIME SPENT: I spent about 45 minutes with this discharge the day of discharge.   ____________________________ Mossyrock Sink, MD rsg:cb D: 01/30/2013 13:35:16 ET T: 01/30/2013 14:26:51 ET JOB#: [id number]  cc: [place] Sink, MD, <Dictator> Lollie Sails, MD Vianne Bulls. Arline Asp, MD Dr. Claiborne Billings, Cardiology Dionisio David, MD, Cardiology  Aalyssa Elderkin America Brown MD ELECTRONICALLY SIGNED 02/04/2013 13:34

## 2015-03-02 NOTE — H&P (Signed)
PATIENT NAME:  Jason Larsen, Jason Larsen MR#:  [id number] DATE OF BIRTH:  Jul 03, 1941  DATE OF ADMISSION:  01/24/2013  PRIMARY CARE PHYSICIAN: Apolonio Schneiders, MD   CHIEF COMPLAINT: Low hemoglobin count.   HISTORY OF PRESENT ILLNESS: The patient is a pleasant 74 year old Caucasian gentleman with past medical history of chronic respiratory failure on 2.5 liters of nasal cannula oxygen, history of systolic heart failure, chronic, and history of atrial fibrillation on Coumadin, comes into the Emergency Room after he was seen at [facility] for his PT-INR check. The patient recently has been feeling very weak and with increasing shortness of breath. His hemoglobin was checked at that time, today, and it was 5.8; he was sent to the Emergency Room for further evaluation and management. Next, in the Emergency Room the patient's labs  again showed a hemoglobin of 5.7. He has been having on and off melanotic stools. Denies any symptoms of GERD. He is being admitted for further evaluation and management of his anemia secondary to possible GI bleed along with weight loss of about 16 pounds, anorexia and nausea.   PAST MEDICAL HISTORY: 1.  History of recent admission of acute hypoxic respiratory failure due to COPD flare and congestive heart failure exacerbation, systolic, acute, in January 2014.  2.  Chronic congestive heart failure, systolic, on home oxygen 2.5 liters.  3.  History of chronic atrial fibrillation, on Coumadin. The patient also takes metoprolol and amiodarone.  4.  CAD, status post bypass in the past.  5.  Type 2 diabetes.  6.  Ex-tobacco smoker.   PAST SURGICAL HISTORY:  1.  Coronary artery bypass graft.  2.  Right hip replacement surgery.   ALLERGIES: No known drug allergies.   CURRENT MEDICATIONS:  1.  Albuterol SVN 3 mL every 4 hours.  2.  Allopurinol 150 mg daily.  3.  Amiodarone 200 mg b.i.d.  4.  Aspirin 81 mg daily.  5.  Diltiazem 120 p.o. daily.  6.  Feosol 1 tablet daily.  7.   Formoterol 12 mcg inhalation b.i.d.  8.  Lasix 40 mg b.i.d.  9.  Losartan 12.5 mg daily.  10.  Metformin 500 two tablets in the morning and 1 at night.  11.  Metoprolol 50 mg 1/2 tablet daily b.i.d.  12.  Mometasone 110 mcg/inhalation 2 puffs once a day in the evening.  13.  Spiriva 18 mcg inhalation daily.  14.  Zocor 20 mEq 1/2 tablet daily.   SOCIAL HISTORY: Lives at home.  Does not smoke. He uses oxygen 2.5 liters nasal cannula continuous. Denies alcohol use.   FAMILY HISTORY: Father died in 78s from emphysema .   Mother had brain cancer and died from heart disease.   REVIEW OF SYSTEMS:   CONSTITUTIONAL: No fever. Positive for fatigue, weakness.  EYES: No blurred or double vision. No glaucoma.  ENT: No tinnitus, ear pain, hearing loss.  RESPIRATORY: Positive for shortness of breath, COPD.  CARDIOVASCULAR: Positive for CHF and dyspnea on exertion.  GASTROINTESTINAL: No nausea, vomiting, diarrhea. Positive for melena.  GENITOURINARY: No dysuria or hematuria.  ENDOCRINE: No polyuria or nocturia.  HEMATOLOGY: Positive for anemia.  SKIN: No acne or rash.  MUSCULOSKELETAL: Positive for arthritis.  NEUROLOGIC: No CVA or TIA.  PSYCHIATRIC: No anxiety or depression.   All other systems reviewed are negative.   PHYSICAL EXAMINATION: GENERAL: The patient is awake, alert, oriented x3, not in acute distress.  VITAL SIGNS: He is afebrile, pulse is 74 to 76, atrial fibrillation, blood  pressure is 107/52, sats 100% on 2.5 liters nasal cannula oxygen.  HEENT: Generalized pallor present.  atraumatic, normocephalic.  PERRLA.  EOM are intact.  Oral mucosa is moist.  NECK: Supple. No JVD. No carotid bruits.  RESPIRATORY:  Clear to auscultation. Decreased breath sounds in the bases. No respiratory distress or use of accessory muscles.  CARDIOVASCULAR: heart Rate is regular. Rhythm is irregularly regular. No murmur is heard. PMI is not lateralized.  CHEST: Nontender.  EXTREMITIES: Good pedal  pulses. Good femoral pulses. No lower extremity edema.  ABDOMEN: Soft, benign and nontender. No organomegaly. Positive bowel sounds.  NEUROLOGICAL:  Grossly intact cranial nerves II through XII. No motor or sensory deficits.  PSYCHIATRIC: The patient is awake, alert, oriented x3.   LABORATORIES: Glucose is 168, BUN is 28, creatinine is 1.08, sodium 138, potassium 3.7, chloride is 102, bicarbonate is 27, calcium 8.4 bilirubin is 0.4, alk phos is 91. LFTs within normal limits. CBC: H and H is 5.7 and 18.9, white count is 7.6, MCV is 89. PT-INR is 37.2  and 4.0.   ASSESSMENT AND PLAN: The patient is a 74 year old with history of chronic respiratory failure on oxygen, along with chronic systolic congestive heart failure, chronic atrial fibrillation on Coumadin, comes in with:  1.  Increasing shortness of breath, weakness, and with hemoglobin down to 5.8, which was checked as outpatient and repeat hemoglobin of 5.7, which was checked in the Emergency Room. The patient will be admitted on telemetry floor. We will give 2 units of blood transfusion. The patient was explained the risks and benefits of blood transfusion.  He has agreed and consented to it. He has also been complaining of anorexia, weight loss 16 pounds down from January 2014, need to rule out peptic ulcer disease as well as colon malignancy. He has also been having some melanotic stools on and off, and the patient is also on Coumadin, which will be held at this time. His hemoglobin in January was 11.0. We will have GI see the patient. In the meantime, I will start the patient on IV Protonix b.i.d. and follow up hemoglobin and hematocrit closely and transfuse as needed.  2.  Chronic atrial fibrillation on Coumadin. We will have to hold off Coumadin until the gastroenterology workup is completed. The patient is agreeable. The patient is aware of him having risk of stroke being off Coumadin. He is agreeable to it.  3.  Type 2 diabetes. Since the  patient is n.p.o., I will hold off on the metformin.  We will keep him on the sliding scale insulin.  4.  Chronic respiratory failure on home oxygen 2.5 liters: We will continue his oxygen, continue SVNs and oral inhalers, give Lasix if needed.  5.  Chronic systolic congestive heart failure, ejection fraction is 45% by echocardiogram in January 2014. We will give extra dose of Lasix, if needed, between blood transfusion to avoid any hypovolemia. We will continue the rest of the cardiac medications. I will hold off on diltiazem since the patient's blood pressure is a little on the lower side.  6.  Deep vein thrombosis prophylaxis with sequential compression devices and thromboembolic deterrents. 7.  Further workup according to the patient's clinical course. Hospital admission plan was discussed with the patient and the patient's family members.   TIME SPENT: 55 minutes.   ____________________________ Hart Rochester Posey Pronto, MD sap:cb D: 01/24/2013 14:19:20 ET T: 01/24/2013 14:53:29 ET JOB#: [id number]  cc: Carlethia Mesquita A. Posey Pronto, MD, <Dictator> Vianne Bulls. Arline Asp, MD  Ilda Basset MD ELECTRONICALLY SIGNED 02/05/2013 13:05

## 2015-03-02 NOTE — Consult Note (Signed)
Chief Complaint:  Subjective/Chief Complaint patient not in room, out walking off floor.  No reported evidence of GI bleed.  HGB stable.  Labs reviewed.  Case discussed with Dr James Ivanoff.  REcommend UGIS as PT is very slowly coming down despite Vit K.  Also respiratory status is very high risk for sedated proceedure, though cardiac status more acceptable. Continue bid ppi as outpatient.  If the UGIS shows lesion needing visualization this can be scheduled.  Avoid nsaids, closer monitoring and adjusting of coumadin if continued as outpatient.  Further  would consider tfx of one unit prbc due to the nature of the hospitalization to improve cardiac status.   I will ask Dr Dionne Milo to see patient tomorrow as I will not be available until monday.   VITAL SIGNS/ANCILLARY NOTES: **Vital Signs.:   20-Mar-14 10:05  Vital Signs Type Q 4hr  Temperature Temperature (F) 97.4  Celsius 36.3  Temperature Source oral  Pulse Pulse 93  Respirations Respirations 20  Systolic BP Systolic BP 960  Diastolic BP (mmHg) Diastolic BP (mmHg) 64  Mean BP 79  Pulse Ox % Pulse Ox % 94  Pulse Ox Activity Level  At rest  Oxygen Delivery 2L; Nasal Cannula   Electronic Signatures: Loistine Simas (MD)  (Signed 20-Mar-14 12:33)  Authored: Chief Complaint, VITAL SIGNS/ANCILLARY NOTES   Last Updated: 20-Mar-14 12:33 by Loistine Simas (MD)

## 2015-03-02 NOTE — H&P (Signed)
PATIENT NAME:  Jason Larsen, Jason Larsen MR#:  [id number] DATE OF BIRTH:  [birth year]  DATE OF ADMISSION:  11/25/2012  Addendum  HOME MEDICATIONS:  1.  Albuterol sulfate nebulizer every 4 hours as needed for cough and wheezing.  2.  Allopurinol 150 mg p.o. daily.  3.  Amiodarone 100 mg p.o. daily. 4.  Asmanex/mometasone 110 mcg aerosol powder inhalation 2 puffs b.i.d.  5.  Aspirin 81 mg p.o. daily.  6.  Bystolic 5 mg p.o. daily.  7.  Coumadin 1.5 mg p.o. daily.  8.  Diltiazem 120 mg p.o. daily.  9.  Ferrous sulfate 1 tablet p.o. daily.  10.  Lasix 40 mg 1 to 2 tablets daily as needed.  11.  Losartan 50 mg p.o. daily.  12.  Metoprolol 25 mg p.o. b.i.d.  13.  Prilosec 20 mg p.o. b.i.d.  14.  Spiriva HandiHaler capsule once a day.  15.  Zocor 10 mg p.o. at bedtime.   ____________________________ Gladstone Lighter, MD rk:jm D: 11/25/2012 14:08:12 ET T: 11/25/2012 15:00:58 ET JOB#: [id number]  cc: Gladstone Lighter, MD, <Dictator> Gladstone Lighter MD ELECTRONICALLY SIGNED 12/01/2012 19:31

## 2015-03-02 NOTE — Op Note (Signed)
PATIENT NAME:  Jason Larsen, Jason Larsen MR#:  [id number] DATE OF BIRTH:  1941-06-10  DATE OF PROCEDURE:  10/25/2013  PRINCIPAL DIAGNOSIS: Gross hematuria.  POSTOPERATIVE DIAGNOSES: Left distal ureteral tumor, left renal pelvic tumor, gross hematuria.   PROCEDURES: Bilateral ureteroscopy with left tumor resection and fulguration.  Bilateral retrograde pyelogram.   SURGEON: Edrick Oh, M.D.   ANESTHESIA: Laryngeal mask airway anesthesia.   INDICATIONS: The patient is a 74 year old gentleman with a history of intermittent gross hematuria on blood thinners. He has undergone previous evaluation of the upper tracts  demonstrating no definitive upper tract abnormalities. He has also undergone previous cystoscopy demonstrating no significant bladder abnormalities. With the persistent hematuria we have elected to proceed with further evaluation. He presents today for this purpose.   DESCRIPTION OF PROCEDURE: After informed consent was obtained, the patient was taken to the Operating Room and placed in the dorsal lithotomy position under laryngeal mask airway anesthesia. The patient was then prepped and draped in the usual standard fashion. The 81 French rigid cystoscope was introduced into the urethra under direct vision with no urethral abnormalities noted. Upon entering the prostatic fossa, moderate bilobar hypertrophy was noted with visual obstruction. Upon entering the bladder, the mucosa was inspected in its entirety with no gross mucosal lesions noted. Bilateral ureteral orifices were well visualized with no lesions noted. An 8 [place] cone-tipped catheter was then utilized. It was inserted into the left ureteral orifice. A retrograde pyelogram was performed in the standard fashion. The ureter could be visualized in its entirety with no ureteral abnormalities appreciated. However, within the renal pelvis in the inferior  portion there was an area that demonstrated a possible filling defect. A retrograde pyelogram  was similarly performed on the right with no significant abnormalities appreciated. There was prompt drainage of contrast, a guidewire was then advanced into the left ureteral orifice. It was easily advanced into the upper pole collecting system under fluoroscopic guidance without difficulty. The cystoscope was removed. The 6 French rigid ureteroscope was advanced into the urinary bladder. A second guidewire was utilized to help facilitate navigation. Just inside of the orifice an area of papillary -appearing tumor was encountered extending from a pedunculated area on the left lateral aspect of the ureter. A nitinol basket was utilized for tumor removal. It was opened within the ureter. The bulk of the papillary portion of the tumor was grasped utilizing the grasper and was removed without difficulty. Two sections were subsequently removed with only a small base of the tumor remaining. An attempt was made at further resection utilizing biopsy forceps; however, these were inadequate for the specimen. The area was then cauterized utilizing electrocautery. Due to the extent of cauterization extending approximately 1.5 the diameter of the ureter, the decision was made to ultimately place a stent. The scope was advanced further up the ureter. It was advanced into the UPJ and renal pelvic region. No other ureteral abnormalities were appreciated. Entering the renal pelvis a papillary tumor approximately 2 cm in length was noted extending from a more anterior portion of the renal pelvis. It too was pedunculated on a relatively small stalk. The basket was utilized to remove the tumor in several pieces. The base of the tumor was all that remained. The area was extensively cauterized utilizing the Bugbee electrode. No significant bleeding was encountered. No other lesions were noted within the renal pelvis or the calyces that were more easily identified. Only one lower pole calyx was present which could not be visualized with  the rigid scope. The decision was made not to proceed with flexible ureteroscopy based on a normal retrograde pyelogram in that calyx with no other visible abnormalities appreciated. The ureteroscope was removed. It was advanced into the right ureteral orifice utilizing guidewire for navigation. The scope was advanced to the level of the renal pelvis with no ureteral or renal pelvic abnormalities appreciated. Two lateral calyces and the inferior calyx were not visualized with the rigid scope, with a normal retrograde pyelogram. The decision was made not to proceed with flexible ureteroscopy in these areas. Washings had been obtained from the left renal pelvis. These will be sent for evaluation.   Once the procedures were completed. The cystoscope was replaced back into the urinary bladder. It was backloaded over the guidewire in the left ureteral orifice. A 6 French x 24 cm double-J ureteral stent was advanced over the guidewire into the upper pole collecting system without difficulty. Adequate curl was noted within the renal pelvis. Adequate curl was also noted within the urinary bladder. The bladder was then drained. The cystoscope was removed. The patient was returned to the supine position and awakened from laryngeal mask airway anesthesia. He was taken to the recovery room in stable condition. There were no problems or complications. The patient tolerated the procedure well.    ____________________________ Denice Bors. [name], MD bsc:sg D: 10/25/2013 14:05:17 ET T: 10/25/2013 14:33:05 ET JOB#: [id number]  cc: Denice Bors. [name], MD, <Dictator> Denice Bors [name] MD ELECTRONICALLY SIGNED 10/25/2013 16:22

## 2015-03-02 NOTE — Discharge Summary (Signed)
PATIENT NAME:  Jason Larsen, Jason Larsen MR#:  [id number] DATE  DATE OF BIRTH:  [birth year]  DATE OF ADMISSION:  11/25/2012 DATE OF DISCHARGE:  11/27/2012  DISCHARGE DIAGNOSES:  Acute respiratory failure, acute systolic heart failure, chronic obstructive pulmonary disease exacerbation, atrial fibrillation, hypertension.    PRIMARY CARE PHYSICIAN:  Dr. Apolonio Schneiders at [facility], [place], [place].  HISTORY OF PRESENT ILLNESS:  The patient is a 73 year old Caucasian male with past medical history of significant coronary artery disease status post bypass graft surgery, hypertension, atrial fibrillation, on Coumadin, went to [facility] today for followup for his left arm swelling, was on antibiotics for the last week.  He was noted to have oxygen saturation of 88% on room, so was brought to the Emergency Room.  Chest x-ray in the ER showed new onset congestion.  The patient also had extensive wheezing on exam.  He was admitted for shortness of breath.     HOSPITAL COURSE AND STAY:   1.  Acute hypoxic respiratory failure secondary to chronic obstructive pulmonary disease and congestive heart failure exacerbation.  He was started on IV Solu-Medrol, nebulizer with MiniNeb solution.  2.  Congestive heart failure.  He did not have any history of this in the past.  His respiratory status improved with treatment and he was discharged with her baseline which was 2 to 3 liters while exertion.   3.  Congestive heart failure, there was no prior history of congestive heart failure, so we thought it might be systolic versus diastolic.  Echocardiogram was ordered and Lasix twice daily was given for fluid management.  Echocardiogram result as following.  Ventricular systolic function is mildly reduced, ejection fraction 45% to 50%, septal motion consistent with conduction abnormality.  Right ventricular systolic function is normal.  Atrium is mildly dilated.  There is mild to moderate mitral regurgitation.   Right ventricular systolic pressure is elevated 30 to 40.   4.  For A-Fib, amiodarone, warfarin and metoprolol were continued.   5.  Coronary artery disease history status post bypass graft, stable in the hospital.  He was monitored on telemetry and continued home medication like metoprolol and simvastatin. 6.  Diabetes mellitus, seems to be diet-controlled.  His HbA1c was 6.7.  7.  Tobacco use disorder.  Nicotine patch while in the hospital.    Iron Post:  CK total was 34, CK MB was 0.7.  WBC 5.6, hemoglobin 11, platelet count 224, hematocrit 33.2.Marland Kitchen  Repeat chest x-ray was consistent with congestive heart failure finding.  INR level was 3.7 on discharge.   CONDITION ON DISCHARGE:  Stable.   CODE STATUS ON DISCHARGE:  FULL CODE.   MEDICATIONS ON DISCHARGE:  Albuterol inhalation solution 3 mL every 4 hours as needed for wheezing, allopurinol 300 mg oral tablet 0.5 orally once a day, mometasone inhalation aerosol powder 2 puff every evening, aspirin 81 mg once a day, Lasix 40 mg once a day as needed, metoprolol 50 mg oral tablet 0.5 2 times a day, omeprazole 20 mg 2 times a day, Spiriva 18 mcg inhalation capsule once a day, Zocor 20 mg once a day, ferrous iron twice a day, formoterol 12 mcg inhalation capsule every 12 hours, Coumadin 1 mg oral tablet once a day for 30 days, Diltiazem 180 mg 24 hour capsule once a day for 30 days, prednisone 10 mg oral tablet 60 mg starting and taper 10 mg daily until complete, lisinopril 5 mg oral tablet once a  day, amiodarone 400 mg oral tablet 2 times a day for nine days and then amiodarone 200 mg oral tablet once a day,  HOME OXYGEN:  No.   PORTABLE TANK:  Yes.  Oxygen delivery at home.    DIET:  Low sodium, low fat, low cholesterol, carbohydrate-controlled.    OTHER ADVICE TO FOLLOW:  Decrease Coumadin to 1 mg daily as INR was a little higher.  Need to check INR in 4 to 5 days.  Follow up with Dr. Georgina Peer cardiologist in  1 to 3 weeks.  Patient states that she has appointment next Monday.   TOTAL TIME SPENT IN DISCHARGE:  Forty-five minutes.      ____________________________ Ceasar Lund Anselm Jungling, MD vgv:ea D: 12/01/2012 22:47:09 ET T: 12/02/2012 02:43:59 ET JOB#: [id number]  cc: Ceasar Lund. Anselm Jungling, MD, <Dictator> Vianne Bulls. Arline Asp, MD Vaughan Basta MD ELECTRONICALLY SIGNED 12/25/2012 15:52

## 2015-03-02 NOTE — H&P (Signed)
PATIENT NAME:  Jason Larsen, Jason Larsen MR#:  [id number] DATE OF BIRTH:  07/25/1941  DATE OF ADMISSION:  11/25/2012  PRIMARY CARE PHYSICIAN: Dr. Apolonio Schneiders.   PRIMARY CARDIOLOGIST: Dr. Claiborne Billings from [place].  CHIEF COMPLAINT: Difficulty breathing and low oxygen sats.  HISTORY OF PRESENT ILLNESS: The patient is a 73 year old Caucasian male with past medical history significant for coronary artery disease, status post bypass graft surgery, hypertension, atrial fibrillation on Coumadin, who went to [facility] walk-in care today for followup of his left arm swelling and was on antibiotics for the same over the last week. Was noted to have oxygen sats of 88% on room air so was then brought to the ER. Chest x-ray in the ER showed new onset congestive heart failure and the patient also had extensive wheezing on exam, so he is being admitted for the same.   PAST MEDICAL HISTORY:  1.  Coronary artery disease, status post bypass graft surgery.  2.  A. fib, on Coumadin.  3.  Hypertension.  4.  Borderline diabetes mellitus:   PAST SURGICAL HISTORY:  1.  Coronary artery bypass graft surgery.  2.  Right hip replacement surgery.   ALLERGIES: No known drug allergies.   CURRENT MEDICATIONS: The patient does not know what medications he takes at home. Currently we are awaiting Dr. Milagros Evener secretary to fax a list of medications to [place].   SOCIAL HISTORY: Lives at home with wife. Continues to smoke about 3 to 4 cigarettes per day, but used to smoke about 3 to 4 packs every day in the past. He drinks about 1 to 2 beers every day. Currently not working.   FAMILY HISTORY: Father died in 38s from emphysema and TB. Mom had a brain cancer but died from heart disease in her 80s.   REVIEW OF SYSTEMS:  CONSTITUTIONAL: No fever. Positive for fatigue and chills. EYES: Positive for blurred vision and early immature cataracts. No glaucoma or inflammation.  ENT: No tinnitus, ear pain. Positive for mild to moderate hearing  loss. No epistaxis or discharge.  RESPIRATORY: Positive for chronic cough and also positive for wheezing. No hemoptysis. Positive for COPD.  CARDIOVASCULAR: No chest pain, orthopnea, edema, arrhythmia, palpitations or syncope.  GASTROINTESTINAL: Positive for decreased appetite but no nausea, vomiting, diarrhea, hematemesis or melena.  GENITOURINARY: No dysuria, hematuria, renal calculus, frequency or incontinence.  ENDOCRINE: No polyuria, nocturia, thyroid problems, heat or cold intolerance.  HEMATOLOGY: No anemia, easy bruising or bleeding.  SKIN: No acne, rash or lesions.  MUSCULOSKELETAL: No neck, back, shoulder pain, arthritis or gout.  NEUROLOGIC: No numbness, weakness, CVA, TIA, or seizures. PSYCHOLOGICAL: No anxiety, insomnia or depression.   PHYSICAL EXAMINATION:  VITAL SIGNS: Temperature 97.3 degrees Fahrenheit, pulse 83, respirations 20, blood pressure 138/69, pulse ox 95% on 2 liters.  GENERAL: Well built, well nourished male lying in bed in mild respiratory distress, especially on talking and exerting.  HEENT: Normocephalic, atraumatic. Pupils equal, round, reacting to light. Anicteric sclerae. Extraocular movements intact. Oropharynx clear without erythema, mass or exudates.   NECK: Supple. No thyromegaly, JVD or carotid bruits. No lymphadenopathy.  LUNGS: Diffuse scattered expiratory wheeze bilaterally and bibasilar crackles are present. Minimal use of accessory muscles, especially on exertion.  CARDIOVASCULAR: S1, S2, irregular rhythm and normal rate. No murmurs, rubs or gallops.  ABDOMEN: Soft, nontender, nondistended. No hepatosplenomegaly. Normal bowel sounds.  EXTREMITIES: No pedal edema, no clubbing or cyanosis, 2+ dorsalis pedis pulses palpable bilaterally.  SKIN: No acne, rash or lesions.  LYMPHATIC: No  cervical lymphadenopathy.  NEUROLOGIC: Cranial nerves intact. No focal motor or sensory deficits.  PSYCHOLOGICAL: The patient is awake, alert, oriented x 3.    LABORATORY, DIAGNOSTIC AND RADIOLOGICAL DATA: WBC 5.6, hemoglobin 11.7, hematocrit 33.2, platelet count 224. Sodium 142, potassium 4.0, chloride 106, bicarbonate 28, BUN 19, creatinine 0.92, glucose 238, calcium 9.0. ALT 22, AST 17, alkaline phosphatase 126, total bilirubin 0.7 and albumin 3.2. First set of troponin is less than 0.02. BNP is elevated at 1681. INR is 2.9, which is therapeutic the patient. CK 34, CK-MB 0.7. Chest x-ray on admission showing bilateral interstitial pulmonary opacities, could be interstitial pulmonary edema, atypical infection or interstitial lung disease. Followup x-ray is recommended. EKG showing atrial fibrillation, rate controlled, heart rate of 78, no acute ST-T wave abnormalities.   ASSESSMENT AND PLAN: This 74 year old male with history of coronary artery disease, status post bypass graft surgery, atrial fibrillation on Coumadin, is being admitted for acute hypoxic respiratory failure.  1.  Acute hypoxic respiratory failure secondary to chronic obstructive pulmonary disease and congestive heart failure exacerbation. For his chronic obstructive pulmonary disease, will start on intravenous Solu-Medrol, nebulizers and inhalers and monitor.  2.  Congestive heart failure exacerbation. No prior history. Sounds acute, systolic versus diastolic. Will get an echo and also consult [facility]. Started on intravenous Lasix twice daily. Repeat chest x-ray and continue cardiac medications.  3.  Coronary artery disease, status post bypass graft surgery, appears stable at this time. Monitor on telemetry. Follow up echo and cardiology recommendations. Continue home medications.  4.  Diabetes mellitus, seems diet controlled. Check hemoglobin A1c.  5.  Tobacco use disorder. Counseled for 3 minutes against smoking. Started on nicotine patch while in the hospital.  6.  Gastrointestinal and deep vein thrombosis prophylaxis with Protonix and subcutaneous heparin.   CODE STATUS: Full  code.   TIME SPENT ON ADMISSION: 55 minutes.   ____________________________ Gladstone Lighter, MD rk:jm D: 11/25/2012 13:52:18 ET T: 11/25/2012 14:34:59 ET JOB#: [id number]  cc: Gladstone Lighter, MD, <Dictator> Vianne Bulls. Arline Asp, MD Gladstone Lighter MD ELECTRONICALLY SIGNED 12/13/2012 21:18

## 2015-03-02 NOTE — Consult Note (Signed)
PATIENT NAME:  Jason Larsen, Jason Larsen MR#:  [id number] DATE OF BIRTH:  1941-02-11  DATE OF CONSULTATION:  01/25/2013  CONSULTING PHYSICIAN:  Dionisio David, MD  HISTORY OF PRESENT ILLNESS: This is a 74 year old white male with a past medical history of severe chronic obstructive pulmonary disease on 2.5 liters of nasal cannula oxygen at home, history of congestive heart failure, history of atrial fibrillation on Coumadin, came into the Emergency Room because of a GI bleed. He had a hemoglobin of 5.7. He received over 2 units of blood and now is around 7.0. He feels a lot better, though.  He denies any shortness of breath. His main symptoms were he was just feeling very weak, dizzy and short of breath, some tightness in the chest.   PAST MEDICAL HISTORY: 1. History of congestive heart failure.  2. Systolic heart failure.  3. History of COPD.  4. History of CABG.  5. History of type 2 diabetes.  6. History of heavy smoking.  7. Right hip replacement.   ALLERGIES: None.   MEDICATIONS: Albuterol, allopurinol, amiodarone, aspirin, diltiazem, Lasix 40 b.i.d., metformin 500 at night, metoprolol 50 mg, 1/2 tablet b.i.d., Zocor 20 mg once a day.   SOCIAL HISTORY: He lives at home. He is on 2.5 liters nasal cannula oxygen.   PHYSICAL EXAMINATION: GENERAL: He is alert, oriented x3, in no acute distress.  VITAL SIGNS: Stable.  NECK: No JVD.  LUNGS: There is occasional wheezing.  HEART: Irregularly irregular pulse. No audible murmur.  ABDOMEN: Soft, nontender, positive bowel sounds.  EXTREMITIES: No pedal edema.   NEUROLOGIC: The patient appears to be intact. There is no EKG in the chart.   LABORATORY DATA: His hemoglobin, last one was over 7.0.   ASSESSMENT AND PLAN: The patient does have compensated CHF.  He is on home O2, but he is having a low-risk procedure, EGD and colonoscopy. As long as Anesthesia  is going to give a limited amount of sedation, I advise proceeding with EGD and colonoscopy to  find out what is causing his bleeding.  Although his hematocrit was low initially, it has improved significantly and he is feeling better; and INR was up to 8, and that may explain his cause of bleeding; but given the fact that he had so much bleeding, maybe at least EGD should be done right away, and colonoscopy can be done too with low-to-moderate risk.    Thanks you very much for the referral.   ____________________________ Dionisio David, MD sak:cb D: 01/25/2013 16:32:55 ET T: 01/25/2013 16:44:33 ET JOB#: [id number]  cc: Dionisio David, MD, <Dictator> Dionisio David MD ELECTRONICALLY SIGNED 02/01/2013 9:05

## 2015-03-02 NOTE — Consult Note (Signed)
Chief Complaint:  Subjective/Chief Complaint seen for anemia, reported black stools.  doing well s/p transfusion.  tolerating clears, denies n.v or abdominal pain.   VITAL SIGNS/ANCILLARY NOTES: **Vital Signs.:   19-Mar-14 05:34  Vital Signs Type Routine  Temperature Temperature (F) 98.3  Celsius 36.8  Temperature Source oral  Pulse Pulse 90  Respirations Respirations 20  Systolic BP Systolic BP 315  Diastolic BP (mmHg) Diastolic BP (mmHg) 72  Mean BP 93  Pulse Ox % Pulse Ox % 96  Pulse Ox Activity Level  At rest  Oxygen Delivery 3L    08:47  Pulse Pulse 400  Systolic BP Systolic BP 867  Diastolic BP (mmHg) Diastolic BP (mmHg) 71  Mean BP 89  Telemetry pattern Cardiac Rhythm Atrial fibrillation   Brief Assessment:  Cardiac Irregular   Respiratory clear BS  minimal occasional wheeze   Gastrointestinal details normal Soft  Nontender  Nondistended  No masses palpable  Bowel sounds normal   Lab Results: LabObservation:  19-Mar-14 10:36   OBSERVATION Reason for Test  Cardiology:  19-Mar-14 10:36   Echo Doppler REASON FOR EXAM:     COMMENTS:     PROCEDURE: [facility] - ECHO DOPPLER COMPLETE  - Jan 26 2013 10:36AM   RESULT: Echocardiogram Report  Patient Name:   Jason Larsen Date of Exam: 01/26/2013 Medical Rec #:  [id number]            Custom1: Date of Birth:  1941-03-18          Height:       63.8 in Patient Age:    74 years          Weight:       144.0 lb Patient Gender: M                 BSA:          1.70 m??  Indications: Atrial Fib Sonographer:    Janalee Dane RCS Referring Phys: Fritzi Mandes, A  Summary:  1. Left ventricular ejection fraction, by visual estimation, is 50 to  55%.  2. Moderately dilated left atrium.  3. Mild to moderate mitral valve regurgitation.  4. Moderately elevated pulmonary artery systolic pressure.  5. Mild to moderate tricuspid regurgitation. 2D AND M-MODE MEASUREMENTS (normal ranges within parentheses): Left Ventricle:           Normal IVSd (2D):      0.77 cm (0.7-1.1) LVPWd (2D):     0.77 cm (0.7-1.1) Aorta/LA:                  Normal LVIDd (2D):     5.07 cm (3.4-5.7) Aortic Root (2D): 3.20 cm (2.4-3.7) LVIDs (2D):     3.68 cm           Left Atrium (2D): 4.90 cm (1.9-4.0) LV FS (2D):     27.4 %   (>25%) LV EF (2D):     53.0 %   (>50%)                                   Right Ventricle:                                   RVd (2D):        3.05 cm SPECTRAL DOPPLER ANALYSIS (where applicable): Tricuspid Valve and PA/RV Systolic Pressure:  TR Max Velocity: 3.39 m/s RA  Pressure: 5 mmHg RVSP/PASP: 51.0 mmHg  PHYSICIAN INTERPRETATION: Left Ventricle: The left ventricular internal cavity size was normal. LV  posterior wall thickness was normal. Left ventricular ejection fraction,  by visual estimation, is 50 to 55%. Left Atrium: The left atrium is moderately dilated. Mitral Valve: Mild to moderate mitral valve regurgitation is seen. Tricuspid Valve: Mild to moderate tricuspid regurgitation is visualized.   The tricuspid regurgitant velocity is 3.39 m/s, and with an assumed right  atrial pressure of 5 mmHg, the estimatedright ventricular systolic  pressure is moderately elevated at 51.0 mmHg.  [place] MD Electronically signed by [id number] Neoma Laming MD Signature Date/Time: 01/26/2013/12:45:52 PM *** Final ***  IMPRESSION: .    Verified By: Emmit Pomfret. Humphrey Rolls, M.D., MD  Routine Coag:  19-Mar-14 04:19   Prothrombin  27.6  INR 2.7 (INR reference interval applies to patients on anticoagulant therapy. A single INR therapeutic range for coumarins is not optimal for all indications; however, the suggested range for most indications is 2.0 - 3.0. Exceptions to the INR Reference Range may include: Prosthetic heart valves, acute myocardial infarction, prevention of myocardial infarction, and combinations of aspirin and anticoagulant. The need for a higher or lower target INR must be assessed  individually. Reference: The Pharmacology and Management of the Vitamin K  antagonists: the seventh ACCP Conference on Antithrombotic and Thrombolytic Therapy. HFWYO.3785 Sept:126 (3suppl): [id number]. A HCT value >55% may artifactually increase the PT.  In one study,  the increase was an average of 25%. Reference:  "Effect on Routine and Special Coagulation Testing Values of Citrate Anticoagulant Adjustment in Patients with High HCT Values." American Journal of Clinical Pathology 2006;126:400-405.)  Routine Hem:  17-Mar-14 12:13   WBC (CBC) 7.6  Hemoglobin (CBC)  5.7  Platelet Count (CBC) 308 (Result(s) reported on 24 Jan 2013 at 12:29PM.)    13:00   WBC (CBC) 7.6  Hemoglobin (CBC)  5.9  Platelet Count (CBC) 323  18-Mar-14 09:08   Hemoglobin (CBC)  7.9 (Result(s) reported on 25 Jan 2013 at 09:31AM.)  19-Mar-14 04:19   WBC (CBC)  2.8  RBC (CBC)  2.67  Hemoglobin (CBC)  7.7  Hematocrit (CBC)  23.3  Platelet Count (CBC) 212  MCV 87  MCH 28.7  MCHC 32.9  RDW  14.6  Neutrophil % 87.1  Lymphocyte % 10.2  Monocyte % 2.6  Eosinophil % 0.0  Basophil % 0.1  Neutrophil # 2.4  Lymphocyte #  0.3  Monocyte #  0.1  Eosinophil # 0.0  Basophil # 0.0 (Result(s) reported on 26 Jan 2013 at 05:15AM.)   Radiology Results: Cardiology:    18-Mar-14 17:00, ECG  ECG interpretation   Atrial fibrillation with premature ventricular or aberrantly conducted complexes  Cannot rule out Inferior infarct , age undetermined  ST & T wave abnormality, consider lateral ischemia or digitalis effect  Abnormal ECG  When compared with ECG of 25-Nov-2012 11:53,  No significant change was found  ----------unconfirmed----------  Confirmed by OVERREAD, NOT ([id number]), editor PEARSON, BARBARA ([id number]) on 01/26/2013 8:22:48 AM    19-Mar-14 10:36, Echo Doppler  Echo Doppler   REASON FOR EXAM:      COMMENTS:       PROCEDURE: [facility] - ECHO DOPPLER COMPLETE  - Jan 26 2013 10:36AM     RESULT: Echocardiogram  Report    Patient Name:   Jason Larsen Date of Exam: 01/26/2013  Medical Rec #:  [id number]  Custom1:  Date of Birth:  August 06, 1941          Height:       63.8 in  Patient Age:    74 years          Weight:       144.0 lb  Patient Gender: M                 BSA:          1.70 m??    Indications: Atrial Fib  Sonographer:    Janalee Dane RCS  Referring Phys: Fritzi Mandes, A    Summary:   1. Left ventricular ejection fraction, by visual estimation, is 50 to   55%.   2. Moderately dilated left atrium.   3. Mild to moderate mitral valve regurgitation.   4. Moderately elevated pulmonary artery systolic pressure.   5. Mild to moderate tricuspid regurgitation.  2D AND M-MODE MEASUREMENTS (normal ranges within parentheses):  Left Ventricle:          Normal  IVSd (2D):      0.77 cm (0.7-1.1)  LVPWd (2D):     0.77 cm (0.7-1.1) Aorta/LA:                  Normal  LVIDd (2D):     5.07 cm (3.4-5.7) Aortic Root (2D): 3.20 cm (2.4-3.7)  LVIDs (2D):     3.68 cm           Left Atrium (2D): 4.90 cm (1.9-4.0)  LV FS (2D):     27.4 %   (>25%)  LV EF (2D):     53.0 %   (>50%)                                    Right Ventricle:                                    RVd (2D):        3.05 cm  SPECTRAL DOPPLER ANALYSIS (where applicable):  Tricuspid Valve and PA/RV Systolic Pressure: TR Max Velocity: 3.39 m/s RA   Pressure: 5 mmHg RVSP/PASP: 51.0 mmHg    PHYSICIAN INTERPRETATION:  Left Ventricle: The left ventricular internal cavity size was normal. LV   posterior wall thickness was normal. Left ventricular ejection fraction,   by visual estimation, is 50 to 55%.  Left Atrium: The left atrium is moderately dilated.  Mitral Valve: Mild to moderate mitral valve regurgitation is seen.  Tricuspid Valve: Mild to moderate tricuspid regurgitation is visualized.     The tricuspid regurgitant velocity is 3.39 m/s, and with an assumed right   atrial pressure of 5 mmHg, the estimatedright ventricular systolic    pressure is moderately elevated at 51.0 mmHg.    [place] MD  Electronically signed by [id number] Neoma Laming MD  Signature Date/Time: 01/26/2013/12:45:52 PM  *** Final ***    IMPRESSION: .        Verified By: Emmit Pomfret. Humphrey Rolls, M.D., MD   Assessment/Plan:  Assessment/Plan:  Assessment 1) anemia, reported black stools pta.  stable s/p tfx.  awaiting reports of last egd and colonoscopy, these have been requested from [facility] hospital.  2) severe copd/h/o chf, CAD/cabg/chronic AF   Plan 1) INR today 2.7, will need to be about 1.3 prior to EGD.  Awaiting reports  from [facility]  Patietn is high risk for sedated proceedure, will need anesthesia assistance.   Electronic Signatures: Loistine Simas (MD)  (Signed 19-Mar-14 13:31)  Authored: Chief Complaint, VITAL SIGNS/ANCILLARY NOTES, Brief Assessment, Lab Results, Radiology Results, Assessment/Plan   Last Updated: 19-Mar-14 13:31 by Loistine Simas (MD)

## 2015-03-02 NOTE — Consult Note (Signed)
General Aspect 74 year old Caucasian male with past medical history significant for coronary artery disease, status post bypass graft surgery 20 years ago, severe COPD, continued smoking, hypertension, paroxysmal atrial fibrillation on Coumadin, presenting with SOB.  He went to [facility] care today for followup of his left arm swelling and was on antibiotics for the same over the last week. Was noted to have oxygen sats of 88% on room air so was then brought to the ER.   Chest x-ray in the ER showed new onset congestive heart failure and the patient also had extensive wheezing on exam. EKG showed atrial fibrillation. He was previously in NSR by his report, maintained on amiodarone. He has not been taking his bystolic as he can not afford it. Changed to metoprolol.   PAST MEDICAL HISTORY:  1.  Coronary artery disease, status post bypass graft surgery. 1987? 2.  h/o A. fib, had been maintaining NSR per the patient, on Coumadin.  3.  Hypertension.  4.  Borderline diabetes mellitus:  5.  Long smoking hx, severe COPD requiring frequent ABX and prednisone tapers  PAST SURGICAL HISTORY:  1.  Coronary artery bypass graft surgery.  2.  Right hip replacement surgery.   ALLERGIES: No known drug allergies.   CURRENT MEDICATIONS: The patient does not know what medications he takes at home. Currently we are awaiting Dr. Milagros Evener secretary to fax a list of medications to [place].   SOCIAL HISTORY:  Lives at home with wife. Continues to smoke about 3 to 4 cigarettes per day, but used to smoke about 3 to 4 packs every day in the past. He drinks about 1 to 2 beers every day. Currently not working.   FAMILY HISTORY:  Father died in 8s from emphysema and TB. Mom had a brain cancer but died from heart disease in her 52s.   Physical Exam:   GEN well developed, well nourished, no acute distress    HEENT red conjunctivae    NECK supple  No masses    RESP postive use of accessory muscles   wheezing  rhonchi    CARD Irregular rate and rhythm  Tachycardic  No murmur    ABD denies tenderness  soft    LYMPH negative neck    EXTR negative edema    SKIN normal to palpation    NEURO motor/sensory function intact    PSYCH alert, A+O to time, place, person, good insight   Review of Systems:   Subjective/Chief Complaint SOB, cough    General: Fatigue  Weakness    Skin: No Complaints    ENT: No Complaints    Eyes: No Complaints    Neck: No Complaints    Respiratory: Short of breath  Wheezing    Cardiovascular: Dyspnea    Gastrointestinal: No Complaints    Genitourinary: No Complaints    Vascular: No Complaints    Musculoskeletal: No Complaints    Neurologic: No Complaints    Hematologic: No Complaints    Endocrine: No Complaints    Psychiatric: No Complaints    Review of Systems: All other systems were reviewed and found to be negative    Medications/Allergies Reviewed Medications/Allergies reviewed   Home Medications: Medication Instructions Status  albuterol 2.5 mg/3 mL (0.083%) inhalation solution 3 milliliter(s) inhaled every 4 hours, As Needed- for Wheezing, Cough Active  allopurinol 300 mg oral tablet 0.5 tab(s) orally once a day Active  mometasone 110 mcg/inh inhalation aerosol powder 2 puff(s) inhaled once a day (in  the evening) Active  aspirin 81 mg oral tablet 1 tab(s) orally once a day Active  diltiazem 120 mg/24 hours oral capsule, extended release 1 cap(s) orally once a day Active  Lasix 40 mg oral tablet 1-2 tab(s) orally once a day, As Needed Active  losartan 50 mg oral tablet 1 tab(s) orally once a day Active  metoprolol tartrate 50 mg oral tablet 0.5 tab(s) orally 2 times a day Active  omeprazole 20 mg oral delayed release tablet 1 tab(s) orally 2 times a day Active  Spiriva 18 mcg inhalation capsule 1 each inhaled once a day Active  Zocor 20 mg oral tablet 0.5 tab(s) orally once a day (at bedtime) Active  Coumadin 1 mg oral  tablet Patientt alternates 1.5 tab(s) one day and the next day 1mg  then back to 1.5mg  the next day. Active  Feosol Iron bid Active  formoterol 12 mcg inhalation capsule 1 cap(s) inhaled every 12 hours Active   EKG:   Interpretation EKG shows atrial fibrillation with rate 78 bpm, nonspecific ST abn    No Known Allergies:   Vital Signs/Nurse's Notes: **Vital Signs.:   16-Jan-14 15:30   Vital Signs Type Admission   Temperature Temperature (F) 98.5   Celsius 36.9   Temperature Source tympanic   Pulse Pulse 80   Respirations Respirations 28   Systolic BP Systolic BP 031   Diastolic BP (mmHg) Diastolic BP (mmHg) 86   Mean BP 108   BP Source  if not from Vital Sign Device non-invasive   Pulse Ox % Pulse Ox % 75   Oxygen Delivery 3L     Impression 74 year old Caucasian male with past medical history significant for coronary artery disease, status post bypass graft surgery 20 years ago, severe COPD, continued smoking, hypertension, paroxysmal atrial fibrillation on Coumadin, presenting with SOB, worse over the past few days above baseline.  1) SOB: Suspect multifactorial: he has severe underlying COPD, continues to smoke, likely COPD exacerbation. now also back in atrial fibrillation possible acute on chronic diastolic CHF, exacerbated by going back into atrial fibrillation. COPD flare in 08/2012 adn 09/2012 requiring steroids/ABX.  --Would continue lasix IV BID, steroids, nebs. echo pending to evaluate RVSP. He reports cardiac cath "1 year ago". Will try to review results. Unable to exclude ischemia given CABG, continued smoking.  2) Atrial fibrillation: He has converted back to atrial fibrillation. Uncertain when this happened, though he reports he was maintaining NSR before. No old EKGs to review. Has not seen Dr. Claiborne Billings for several months, prior to COPD exacerbation 08/2012. --Would try to obtain old notes from Dr. Claiborne Billings to look at rhythm. --Increase diltiazem for rate control  as BP tolerates. --Increase amiodarone back to 400 mg po BID in effort to convert back to NSR (he is on warfarin)  3) COPD: severe underlying disease, needs nebs, steroids.  4)CAD, CABG: cardiac enz negative continue asa, statin, low dose b-blocker (could hopld for bronchospasm)   Electronic Signatures: Ida Rogue (MD)  (Signed 16-Jan-14 17:48)  Authored: General Aspect/Present Illness, History and Physical Exam, Review of System, Home Medications, EKG , Allergies, Vital Signs/Nurse's Notes, Impression/Plan   Last Updated: 16-Jan-14 17:48 by Ida Rogue (MD)

## 2015-03-02 NOTE — Consult Note (Signed)
PATIENT NAME:  Jason Larsen, Jason Larsen MR#:  [id number] DATE OF BIRTH:  1941-01-24  DATE OF CONSULTATION:  01/24/2013  REFERRING PHYSICIAN:  Sona A. Posey Pronto, MD CONSULTING PHYSICIAN:  Lollie Sails, MD  REASON FOR CONSULTATION:  Anemia.   HISTORY OF PRESENT ILLNESS:  The patient is a pleasant 74 year old Caucasian male who was sent to the Emergency Room today. He had gone to his primary physician's office to check a protime for followup of his Coumadin levels in regards to history of atrial fibrillation. At that time, they also drew a hemoglobin. When the results came back, he was told he had a hemoglobin less than 6 and was sent to the Emergency Room. From there, he was admitted to the hospital. The patient states that he possibly had seen some blood in the stool over the past couple of weeks, however, this more so being reported from his wife than from the patient. The wife is not currently present, but wife told the patient that she had seen this. The patient states that he has no problems with nausea or vomiting. There is no abdominal pain, no heartburn or dysphagia. He has a bowel movement usually daily. He has no previous history of peptic ulcer disease. He does take Coumadin as noted above and his level has been somewhat high recently. He does have a history of a colonoscopy being done on 03/26/2005 at [facility] that showed some small rectal polyps removed. He also had a colonoscopy done about 5 years ago along with an EGD by Dr. Joya Gaskins at [facility]. Apparently, this was also because of a bleeding episode or anemia episode while taking Coumadin. Currently, he is lying on his side in his bed and answers questions appropriately; however, he is relatively short of breath. States that he does use oxygen at home, 2.5 liters. GI family history is negative for colorectal cancer, liver disease or ulcers.   PAST MEDICAL HISTORY:  History of coronary artery disease with CABG done in approximately 1987, history of atrial  fibrillation on Coumadin. He also takes rate-controlling agents of amiodarone and metoprolol. He has a history of type 2 diabetes. He has a history of COPD and I believe he is not currently smoking. He has had a right hip replacement done. He has a history of hypertension. He has had a history of congestive heart failure with an acute exacerbation in January 2014.   ALLERGIES:  No known drug allergies.   CURRENT MEDICATIONS:  Albuterol 2.5/3 mL inhalation therapy every 4 hours, allopurinol 300 mg 1/2 tablet daily, amiodarone 200 mg twice a day, aspirin 81 mg once a day, diltiazem 120 mg once a day, Feosol iron once a day, formoterol 1 capsule inhalation every 12 hours, Lasix 40 mg twice a day, losartan 25 mg 1/2 tablet once a day, metformin 500 mg 2 in the morning and 1 in the evening, metoprolol tartrate 50 mg 1/2 tablet twice a day, mometasone 110 mcg/inhalation aerosol powder 2 puffs once a day, omeprazole 20 mg twice a day, Spiriva 18 mcg inhaled once a day, Zocor 20 mg 1/2 tablet once a day.   REVIEW OF SYSTEMS:  Ten systems reviewed per admission history and physical. Positive for shortness of breath with his COPD, dyspnea on exertion, weakness, recent melena. Currently, Coumadin is supertherapeutic.   PHYSICAL EXAMINATION: VITAL SIGNS: Temperature is 97.7, pulse 79, respirations 22, blood pressure 120/62, pulse oximetry 100% on 3 liters nasal cannula.  GENERAL: He is an elderly-appearing 74 year old Caucasian male, some  shortness of breath. The patient states that this is his usual.  HEENT: Normocephalic, atraumatic. Eyes are anicteric. Oropharynx: No lesions.  NECK: No JVD.  HEART: Irregularly irregular.  LUNGS: Show a coarse wheeze and rhonchi bilaterally.  ABDOMEN: Soft, nontender, nondistended. Bowel sounds positive, normoactive. There is no apparent organomegaly or masses noted.  RECTAL: Deferred.  EXTREMITIES: No clubbing or cyanosis and no edema.  NEUROLOGICAL: Cranial nerves II  through XII grossly intact.   DIAGNOSTIC DATA:  On admission to the hospital, he had glucose 168, BUN 28, creatinine 1.08, sodium 138, potassium 3.7, chloride 102, bicarbonate 26, calcium 8.4. Hepatic profile showed albumin at 2.8, AST minimally elevated at 41, otherwise normal panel. On admission to the hospital, his initial hemogram showed a white count of 7.6, H and H 5.7/18.9, platelet count of 308, MCV was 89. He has been set for 2 units of packed red cells. His protime was 37.2 and INR of 4.0. There was a portable chest film done with this showing low-grade CHF, underlying COPD, no focal pneumonia, no pleural effusions.   ASSESSMENT AND PLAN:   1.  Profound anemia with symptomatic presentation. This is in the setting of daily Coumadin use, currently supertherapeutic. The patient apparently has had a similar presentation several years ago at [facility] and did undergo EGD and colonoscopy at that time as well. Broad differential diagnosis was multiple possible lesions which could be exacerbated in terms of bleeding by Coumadin usage.  2.  Concern for current cardiac status as well as his brittle respiratory status. In considering possible sedated procedures, I would believe these to be high risk currently, although he may improve some on medications posttransfusion.   RECOMMENDATIONS:   1.  Continue to hold Coumadin as you are.  2.  Transfusion as needed as you are.  3.  Continue PPI.  4.  Would obtain both cardiology and pulmonary consults in regards to risk for luminal evaluation.  5.  Would also obtain results of his EGD and colonoscopy that were done at [facility] several years ago. We will follow with you.    ____________________________ Lollie Sails, MD mus:si D: 01/24/2013 19:54:00 ET T: 01/24/2013 20:17:25 ET JOB#: [id number]  cc: Lollie Sails, MD, <Dictator> Sona A. Posey Pronto, MD Lollie Sails MD ELECTRONICALLY SIGNED 01/26/2013 18:22

## 2015-03-02 NOTE — Consult Note (Signed)
Brief Consult Note: Diagnosis: symptomatic anemia.   Patient was seen by consultant.   Consult note dictated.   Recommend further assessment or treatment.   Comments: Please see full GI consult # [phone].  Patient admitted with marked anemia and supratheraputic coumadin level with recent report of some bloody stools a week ago per patient.  Recent weakness, increased SOB.  denies GI sx.  No family h/o GI malignancy.   Patient had similar episode/presentation about 5 years ago at [facility] in [place] with EGD and colonoscopy at that time.  Recommend continuing to hold coumadin, transfuse as needed, Will need cardiac and pulmonary consult for risk stratification due to severe cardiac and pulmonary disease before considering sedated luminal evaluation.  INR will need to be about 1.3.  Will follow with you.  Electronic Signatures: Loistine Simas (MD)  (Signed 17-Mar-14 19:59)  Authored: Brief Consult Note   Last Updated: 17-Mar-14 19:59 by Loistine Simas (MD)

## 2015-03-03 NOTE — Op Note (Signed)
PATIENT NAME:  Jason Larsen, Jason Larsen MR#:  [id number] DATE OF BIRTH:  [birth year]  DATE OF PROCEDURE:  01/24/2014  PREOPERATIVE DIAGNOSIS: Visually significant cataract of the right eye.   POSTOPERATIVE DIAGNOSIS: Visually significant cataract of the right eye.   OPERATIVE PROCEDURE: Cataract extraction by phacoemulsification with implant of intraocular lens to right eye.   SURGEON: Birder Robson, MD.   ANESTHESIA:  1. Managed anesthesia care.  2. Topical tetracaine drops followed by 2% Xylocaine jelly applied in the preoperative holding area.   COMPLICATIONS: None.   TECHNIQUE:  Stop and chop   DESCRIPTION OF PROCEDURE: The patient was examined and consented in the preoperative holding area where the aforementioned topical anesthesia was applied to the right eye and then brought back to the Operating Room where the right eye was prepped and draped in the usual sterile ophthalmic fashion and a lid speculum was placed. A paracentesis was created with the side port blade and the anterior chamber was filled with viscoelastic. A near clear corneal incision was performed with the steel keratome. A continuous curvilinear capsulorrhexis was performed with a cystotome followed by the capsulorrhexis forceps. Hydrodissection and hydrodelineation were carried out with BSS on a blunt cannula. The lens was removed in a stop and chop technique and the remaining cortical material was removed with the irrigation-aspiration handpiece. The capsular bag was inflated with viscoelastic and the Tecnis ZCB00 18.5-diopter lens, serial number [id number] was placed in the capsular bag without complication. The remaining viscoelastic was removed from the eye with the irrigation-aspiration handpiece. The wounds were hydrated. The anterior chamber was flushed with Miostat and the eye was inflated to physiologic pressure. 0.1 mL of cefuroxime concentration 10 mg/mL was placed in the anterior chamber. The wounds were found to be  water tight. The eye was dressed with Vigamox. The patient was given protective glasses to wear throughout the day and a shield with which to sleep tonight. The patient was also given drops with which to begin a drop regimen today and will follow-up with me in one day.    ____________________________ Livingston Diones. Walker Sitar, MD wlp:ea D: 01/24/2014 22:49:56 ET T: 01/25/2014 05:07:27 ET JOB#: [id number]  cc: Emrys Mceachron L. Terry Bolotin, MD, <Dictator> Livingston Diones Zailee Vallely MD ELECTRONICALLY SIGNED 01/26/2014 9:11

## 2015-03-03 NOTE — Op Note (Signed)
PATIENT NAME:  Jason Larsen, Jason Larsen MR#:  [id number] DATE OF BIRTH:  Jun 29, 1941  DATE OF PROCEDURE:  02/28/2014  PREOPERATIVE DIAGNOSIS: Visually significant cataract of the left eye.   POSTOPERATIVE DIAGNOSIS: Visually significant cataract of the left eye.   OPERATIVE PROCEDURE: Cataract extraction by phacoemulsification with implant of intraocular lens to left eye.   SURGEON: Birder Robson, MD.   ANESTHESIA:  1. Managed anesthesia care.  2. Topical tetracaine drops followed by 2% Xylocaine jelly applied in the preoperative holding area.   COMPLICATIONS: None.   TECHNIQUE:  Stop and chop.  DESCRIPTION OF PROCEDURE: The patient was examined and consented in the preoperative holding area where the aforementioned topical anesthesia was applied to the left eye and then brought back to the Operating Room where the left eye was prepped and draped in the usual sterile ophthalmic fashion and a lid speculum was placed. A paracentesis was created with the side port blade and the anterior chamber was filled with viscoelastic. A near clear corneal incision was performed with the steel keratome. A continuous curvilinear capsulorrhexis was performed with a cystotome followed by the capsulorrhexis forceps. Hydrodissection and hydrodelineation were carried out with BSS on a blunt cannula. The lens was removed in a stop and chop technique and the remaining cortical material was removed with the irrigation-aspiration handpiece. The capsular bag was inflated with viscoelastic and the Tecnis ZCB00 19.0-diopter lens, serial number [id number] was placed in the capsular bag without complication. The remaining viscoelastic was removed from the eye with the irrigation-aspiration handpiece. The wounds were hydrated. The anterior chamber was flushed with Miostat and the eye was inflated to physiologic pressure. 0.1 mL of cefuroxime concentration 10 mg/mL was placed in the anterior chamber. The wounds were found to be water  tight. The eye was dressed with Vigamox. The patient was given protective glasses to wear throughout the day and a shield with which to sleep tonight. The patient was also given drops with which to begin a drop regimen today and will follow-up with me in one day.     ____________________________ Livingston Diones. Artrice Kraker, MD wlp:dmm D: 02/28/2014 22:08:37 ET T: 02/28/2014 22:56:45 ET JOB#: [id number]  cc: Luise Yamamoto L. Xana Bradt, MD, <Dictator> Livingston Diones Kahliya Fraleigh MD ELECTRONICALLY SIGNED 03/01/2014 13:52

## 2015-03-03 NOTE — Op Note (Signed)
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   PROCEDURE: Cystoscopy, bladder biopsy, left ureteroscopy, mitomycin bladder instillation.   SURGEON: Denice Bors. [name], MD  ANESTHESIA: General endotracheal anesthesia.   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 He has undergone previous biopsy, removal and fulguration. He underwent recent surveillance cystoscopy demonstrating at least 3 areas of early papillary-appearing tumor within the bladder. He presents for cystoscopy and bladder biopsy and re-evaluation of the left renal pelvis.   PROCEDURE: After informed consent was obtained, the patient was taken to the operating room and placed in the dorsal lithotomy position under general endotracheal anesthesia. The patient was then prepped and draped in the usual standard fashion. The 94 French rigid cystoscope was introduced into the urethra under direct vision with no urethral abnormalities noted. Upon entering the prostatic fossa, moderate bilobar prostatic hypertrophy was noted with partial visual obstruction. Upon entering the bladder, the mucosa was inspected in its entirety. There was an approximately 8 mm area of raised cone-appearing papillary tumor on the right lateral wall. There were 2 smaller separate areas of early papillary-appearing tumor on the bladder base just medial to the right ureteral orifice. On initial evaluation, no other lesions were appreciated. With the narrow-band imaging, 2 additional areas were noted on the posterior trigone behind the right ureteral orifice and posterior bladder wall. These also had early papillary-appearing  features. Cold cup biopsy forceps were then utilized to remove each of these areas. The areas were then extensively cauterized. They were in very close proximity to one another but were labeled as different aspects for further evaluation. The biopsy and fulguration was an adequate distance from the right ureteral orifice. No stenting was indicated. After completion of the biopsy and fulguration, a flexible tip Glidewire was introduced into the left ureteral orifice. It was easily advanced into the upper pole collecting system under fluoroscopic guidance without difficulty. The 6 French rigid ureteroscope was then utilized. It was easily advanced into the left ureteral orifice; however, approximately 2 cm into the orifice, an area of narrowing was encountered. Visualization could be undertaken past the area; however, the scope was unable to be passed. A second guidewire was utilized to help facilitate navigation. Once again, the scope was unable to be passed. The rigid scope was removed. The flexible scope was replaced, back-loaded over one of the guidewires. It was advanced to the level of the stricture. It too was unable to be passed. An attempt was then made at passing a male sheath for the flexible cystoscope. This too met significant resistance at the level of the narrowing. Due to concerns over ureteral injury, the decision was made to proceed with ureteral dilation. The ureteral dilators were then utilized. The 8 [place] dilator was easily advanced over the guidewire under fluoroscopic guidance. The 10 French sheath dilator was then utilized. It did meet some resistance at the level of the narrowing, but was able to be passed under fluoroscopic guidance without further difficulty. It was advanced to the proximal ureter. The dilator was then removed. The 6 French rigid ureteroscope was then advanced into the urinary bladder. The scope was easily advanced into the left ureteral orifice. The site  of the narrowing  demonstrated slight fullness with no evidence of any papillary features to suggest tumor. No other lesions were noted throughout the ureter. The scope was then advanced into the renal pelvic region. The site of the previous tumor was easily visualized. There was no evidence of any recurrent papillary tumor. Some scarring was present. The upper and lateral calyces were easily identified. There was no evidence of tumor within these calyces. The ureteroscope was removed. Due to the need for dilation, the decision was made to proceed with stent placement. A 7 French x 24 cm double-J ureteral stent was advanced over the guidewire after it was back-loaded into the scope. The 7 French x 24 cm double-J ureteral stent was easily advanced into the upper pole collecting system. Adequate curl was noted within the renal pelvis. Upon withdrawal of the guidewire, adequate curl was also noted within the urinary bladder. The bladder was drained. The cystoscope was removed. An 65 French red rubber catheter was then inserted, and 20 mg of mitomycin reconstituted in 50 mL of sterile water was instilled into the urinary bladder utilizing standard chemotherapy precautions. The catheter was then removed. The patient was returned to the supine position and awakened from general endotracheal anesthesia and was taken to the recovery room in stable condition. There were no problems or complications. The patient tolerated the procedure well.   PATHOLOGY SPECIMENS: From the right lateral wall, right posterior bladder wall, right posterior trigone and right bladder base.   ESTIMATED BLOOD LOSS: Minimal.    ____________________________ Denice Bors. [name], MD bsc:lb D: 02/21/2014 10:11:52 ET T: 02/21/2014 10:31:55 ET JOB#: [id number]  cc: Denice Bors. [name], MD, <Dictator> Denice Bors Taber Sweetser MD ELECTRONICALLY SIGNED 02/23/2014 20:10

## 2015-03-05 NOTE — Addendum Note (Signed)
Addended by: Mena Goes on: 03/05/2015 02:49 PM   Modules accepted: Orders

## 2015-03-09 ENCOUNTER — Encounter: Payer: Self-pay | Admitting: Cardiology

## 2015-03-11 NOTE — Consult Note (Signed)
Brief Consult Note: Diagnosis: anemia.   Patient was seen by consultant.   Consult note dictated.   Comments: Appreciate consult for 74yo caucasian man with history of newly dxd cirrhosis, COPD/bronchitis/cva/chf/afib on coumadin/pud/CAD/gout/dm, for evaluation of cirrhosis/Gi bleeding.  patient limited historian due to intermittent confusion/altered mental status. Son and DIL at bedside but unable to gfive hx. Hx gained per chart review. Evidently patient was doing ok at home, developed confusion, weakness, and lower back pain.  Went to PCP office and was sent to ED, subsequently admitted with a hgb of 6, pt 64.5, INR 7.7. Bun elevated with normal creatinine.GFR >60. Platelets 143.  Ammonia elevated. NO [place] or liver panel yet. There have been no GI complaints other than dark stools. No etoh is years per family report. Is on Omeprazole 20mg  po bid and Fe therapy at home. Coumadin has been held this visit and the patient has received both FFP and PRBC. Currently receiving more prbc now.  In further chart review, patient had similar episode 2014- treated empirically for PUD as it was thought his resp status unstable. Once hgb improved, confusion ceased. Had EGD/colonoscopy 2009 in [place] (i don't have these records) and colonoscopy here 2006 with 2 polyps. Abd distended but soft on exam, nontender. Rectal exam with black heme pos stool. Currently on Xifaxan for HE.  This visit patient with elevated troponins and EKG suggested anterior and inferior ischemia. 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 Has been noted to have some abnormal jerking movments today. Does get sob easily while talking.  Impression and plan. Anemia exacerbation, melena. Heme pos. Poss pud v. other. Agree w coag correct, transfusions, and following hgb. I am going to increase his PPI to IV bid. Will disc EGD with Dr Candace Cruise.  Phillips Grout,  [place] with dopplers and liver panel. Agree with  daily pt/inr. Did discuss  his cardiac/neuro status with Dr Manuella Ghazi- he says he will order cardiology and neurology cx. Will need to be clinically stable in order to tolerate sedated procedures.  Electronic Signatures: Stephens November H (NP)  (Signed 02-Mar-16 14:12)  Authored: Brief Consult Note   Last Updated: 02-Mar-16 14:12 by Theodore Demark (NP)

## 2015-03-11 NOTE — Consult Note (Signed)
NO further bleed. More alert. On xifaxan. EGD completely  normal. No signs of recent CVA. Last colon in 2009 in [place]. If recurrent bleeding, consider repeating colon later as outpt. Will advance diet. Pt can f/u with me later as outpt. Will sign off. thanks.  Electronic Signatures: Verdie Shire (MD)  (Signed on 04-Mar-16 10:45)  Authored  Last Updated: 04-Mar-16 10:45 by Verdie Shire (MD)

## 2015-03-11 NOTE — Consult Note (Signed)
PATIENT NAME:  Jason Larsen, Jason Larsen MR#:  [id number] DATE OF BIRTH:  12/26/1940  DATE OF CONSULTATION:  01/11/2015  Indication For Consult: Elevated troponin, abnormal EKG and cardiac clearance for EGD.   HISTORY OF PRESENT ILLNESS: This is a 74 year old male with past medical history of hypertension, hyperlipidemia, diabetes mellitus, cirrhosis, chronic atrial fibrillation, coronary artery disease status post CABG, who presented to the Emergency Room on Tuesday with wife, complaining of weakness and fatigue. In the ER, he was found to have a hemoglobin of 6, and has history of GI bleed, thus was admitted and cardiology was consulted for clearance for EGD.   PAST MEDICAL HISTORY: Hypertension, hyperlipidemia, diabetes mellitus, cirrhosis, atrial fibrillation, coronary artery disease status post CABG, chronic obstructive pulmonary disease, erectile dysfunction, bladder cancer status post chemotherapy.   REVIEW OF SYSTEMS:  GENERAL: Positive for fatigue and malaise.  RESPIRATORY: No shortness of breath, cough, or wheezing.  CARDIOVASCULAR: No chest pain, pressure, tightness, palpitations, orthopnea, or PND.  GASTROINTESTINAL: Positive for abdominal pain, but no nausea or vomiting.   PAST SURGICAL HISTORY: Coronary artery bypass graft surgery, per patient, about 15 years ago and right hip replacement.   SOCIAL HISTORY: The patient smokes about 10 cigarettes per day, alcohol abuse in the past, but stopped drinking 3 years ago.   FAMILY HISTORY: Mother had heart disease.   HOME MEDICATIONS: Albuterol 2.5 mg by nebulizer every 4 p.r.n. wheezing, allopurinol 300 mg daily, amiodarone 200 mg daily, aspirin 81 mg daily, Cardizem 120 mg daily, formoterol 12 mcg inhalation 2 b.i.d., gabapentin 300 mg at bedtime, iron 45 mg daily, losartan 25 mg half tablet daily, omeprazole 25 mg b.i.d., simvastatin 20 mg half tablet at bedtime, Spiriva 18 mcg daily.   PHYSICAL EXAMINATION:  VITAL SIGNS: Temperature 97.6, pulse  96, respirations 19, blood pressure 130/72, pulse oximetry 98% saturation on 2 liters.  GENERAL: Alert, but disoriented, slurring his words.  HEENT: No JVD or carotid bruits.  RESPIRATORY: Wheezes present bilaterally, using accessory muscles.  CARDIOVASCULAR: Normal S1, S2. No audible murmur.  ABDOMEN: Soft, slightly tender. Positive bowel sounds.  EXTREMITIES: No pedal edema.   PERTINENT LABORATORY DATA: Creatinine 1.2, troponin 0.11, 0.1, 0.11.  INR is now 1.4. EKG shows normal sinus rhythm, 84 beats per minute, first degree AV block, nonspecific ST and T changes.   ASSESSMENT AND PLAN: Elevated troponin is likely secondary to demand ischemia of anemia. The patient has no acute changes on electrocardiogram and no chest pain, thus is low risk for procedure. Advise proceeding. We will continue to follow. Thank you very much for this consult.  ____________________________ Kelby Fam. Baldwin [place] ear:ap D: 01/11/2015 08:53:34 ET T: 01/11/2015 09:11:11 ET JOB#: [id number]  cc: Dyann Ruddle A. Baldwin [place], <Dictator> Kelby Fam Zaccary Creech PA ELECTRONICALLY SIGNED 02/09/2015 13:45

## 2015-03-11 NOTE — H&P (Signed)
PATIENT NAME:  Jason Larsen, Jason Larsen MR#:  [id number] DATE OF BIRTH:  Jun 09, 1941  DATE OF ADMISSION:  01/09/2015  PRIMARY CARE PHYSICIAN: Derinda Late, MD   REFERRING EMERGENCY ROOM PHYSICIAN: Sheryl L. Benjaman Lobe, MD   CHIEF COMPLAINT: Weakness.   HISTORY OF PRESENT ILLNESS: A 74 year old male who has a past history of COPD, hyperlipidemia, gout, coronary artery disease, hypertension, arthritis, atrial fibrillation, erectile dysfunction, diabetes type 2, and bladder cancer who follows with Dr. Baldemar Lenis in the office and, as per wife, who is present in the room and my main source of the history, she said that the patient was sent to Dr. Elzie Rings office last week where he did CAT scan of the abdomen and told him that he has cirrhosis of the lever. The patient continued to get progressively weak and, for the last few days, he could not even get up. He continued to have pain in the lower back, continued to be very weak and confused and dropping anything that he is trying to hold, so wife took him to Dr. Elzie Rings office today again. Concerning for that, Dr. Baldemar Lenis sent him to Emergency Room today. In the ER, he was noted to have hemoglobin of 6 and his ammonia level 63 and the patient was very confused, so given to hospitalist team for admission, ordered a blood transfusion 1 unit for him by ER physician. History obtained from patient's wife, who is present in the room. She refused that the patient did not have any major episode of bleeding in his stool and he vomited any blood. He did not have any fever, also. He is having abnormal movements today which, as per wife, are new for him for the last few days and he confused. His belly is also getting slightly weaker.   REVIEW OF SYSTEMS: Unable to get, as the patient is confused.   PAST MEDICAL HISTORY:  1.  COPD.  2.  Hyperlipidemia.  3.  Gout.  4.  Coronary artery disease.  5.  Hypertension.  6.  Arthritis.  7.  Atrial fibrillation.  8.  GI bleed.  9.   Erectile dysfunction.  10.  Diabetes mellitus type 2.  11.  Gross hematuria.  12.  Bladder cancer, status post chemotherapy.  13.  Chronic congestive heart failure, systolic.  14.  Alcohol drinking in the past.   PAST SURGICAL HISTORY: Coronary artery bypass graft and right hip replacement surgery.   SOCIAL HISTORY: He smokes about 10 cigarettes every day. He drank alcohol in the past. His last drinking was 3 years ago. Lives at home with his wife and on his normal days he does not require any support but, for the last few weeks, he is getting weaker and weaker.   FAMILY HISTORY: Father died in 34s because of emphysema. Mother had brain cancer and died from heart disease.   MEDICATIONS: As per Dr. Elzie Rings office, which sent document today after sending him to the Emergency Room:  1.  Albuterol inhaler 2.5 mg by nebulizer every 4 hours as needed for wheezing.  2.  Allopurinol 300 mg tablet once a day.  3.  Amiodarone 200 mg tablet once a day  4.  Aspirin 81 mg once a day.  5.  Cardizem 120 mg extended release once a day.  6.  Formoterol 12 mcg inhalation capsule 2 times daily.  7.  Gabapentin 300 mg at night.  8.  Iron 45 mg tablet 1 tablet by mouth once daily.  9.  Losartan 25 mg  take 12.5 mg by mouth daily.  10.  Omeprazole 20 mg 2 times a day.  11.  Simvastatin 20 mg oral tablet 10 mg by mouth at nighttime.  12.  Spiriva 18 mcg inhalation capsule once a day.   PHYSICAL EXAMINATION:  VITAL SIGNS: In ER, temperature 98.6, pulse 86, respirations 18, blood pressure 102/50, pulse oximetry is 96 on room air.  GENERAL: The patient is alert, but disoriented. He is having abnormal movement.  HEENT: Head and neck atraumatic. Conjunctivae pale. Oral mucosa moist. Sclerae anicteric. Pupils reactive to light bilaterally.  NECK: Supple. No JVD.  RESPIRATORY: Bilateral equal and clear air entry. No crepitation or wheezing.  CARDIOVASCULAR: S1, S2 present, regular. No murmur.  ABDOMEN: Soft,  slightly distended. No organomegaly felt. Bowel sounds normal.  SKIN: No acne, rashes, or lesions. Appears well-hydrated.  MUSCULOSKELETAL: No pain or tenderness in the joints.  NEUROLOGICAL: The patient is completely disoriented right now, but alert. He does not follow commands. He keeps moving his limbs with some abnormal movements also going on with the right side of the body. There is some abnormal movement but no rigidity. Sensation: The patient responds to stimuli and touch, so I am assuming his sensations are intact, hard to say because he is confused.  PSYCHIATRIC: Currently, unable to check because the patient is confused.   IMPORTANT LABORATORY RESULTS: Glucose 116, BUN 41, creatinine 1.02, sodium 140, potassium 4.2, chloride 108, CO2 of 26, ammonia is 63, and calcium is 7.8. Troponin 0.11. WBC 6.1, hemoglobin 6, platelet count is 205,000, MCV is 105. Urinalysis is negative.   ASSESSMENT AND PLAN: A 74 year old male who has history of recently diagnosed cirrhosis, as per wife, diagnosed within the last few weeks, has atrial fibrillation on Coumadin, hypertension, diabetes, chronic obstructive pulmonary disease, and coronary artery disease came with extreme weakness and abnormal movement and confusion, found having elevated ammonia and low hemoglobin level  1.  Lower gastrointestinal bleed: As per wife, there was no vomiting blood. The patient has liver cirrhosis but there might be lower gastrointestinal bleeding associated with that, which might be worse because of his Coumadin. Currently, we will just hold his Coumadin. Emergency Room ordered to check his INR which is still in process, so I do not have report yet, but hemoglobin is 6 which was 12.5 two months ago. Ordered transfusion by Emergency Room; we will continue checking his serial hemoglobin. There are extra units also kept ready with crossmatch, which can be given if needed. We will call gastroenterology consult for further management.  We will keep him n.p.o. with just medication and give intravenous fluid a little bit to help him getting dehydrated. 2.  Hepatic encephalopathy: Ammonia is high, but with this gastrointestinal bleed I do not like to give him lactulose, which might make him having repeated stool and might get the condition worse. I will just give rifaximin now and get gastroenterology to see him for further evaluation and management.  3.  Ascites: There is some distention also present. There might be a component of ascites also with that, but right now we will address his acute issue and, if needed, we might have to arrange for ascitic tap and further management of that. He does not have fever right now.  4.  Chronic atrial fibrillation on Coumadin now having liver cirrhosis: He is not a candidate for Coumadin anymore. We will just continue amiodarone for rate control for now.  5.  Coronary artery disease and status post bypass: He  was on aspirin; I would like to hold it because of gastrointestinal bleed. Continue his other cardiac medication.  6.  Chronic obstructive pulmonary disease: Currently, there is no active wheezing. We will continue spironolactone and we might have to give nebulizer therapy if he gets short of breath.  7.  Smoking: Smoking cessation counseling done for 4 minutes and offered nicotine patch.   CODE STATUS: Full code. Plan discussed with patient's wife, who is present in the room.   TOTAL TIME SPENT ON THIS ADMISSION: 50 minutes.   ____________________________ Ceasar Lund Anselm Jungling, MD vgv:bm D: 01/09/2015 19:25:00 ET T: 01/09/2015 21:08:05 ET JOB#: [id number]  cc: Ceasar Lund. Anselm Jungling, MD, <Dictator> Derinda Late, MD Vaughan Basta MD ELECTRONICALLY SIGNED 01/25/2015 11:45

## 2015-03-11 NOTE — Consult Note (Signed)
Pt seen and examined. Please see Jason Larsen's notes. Pt with newly diagnosed cirrhosis, probable UGI bleed, and hepatic encephalopathy. Still with asterixis. On xifaxan bid. Hgb 6 and INR 7.7. Getting blood transfusion. Received FFP. Elevated troponin levels. Possible ascites. U/S ordered. If significant ascites, order diagnostic paracentesis. Will eventually need EGD but 1st need to be cleared from cardiology. Will follow. Thanks.  Electronic Signatures: Verdie Shire (MD)  (Signed on 02-Mar-16 14:44)  Authored  Last Updated: 02-Mar-16 14:44 by Verdie Shire (MD)

## 2015-03-11 NOTE — Consult Note (Signed)
Chief Complaint:  Subjective/Chief Complaint Still with melena. No abdominal pain today. INR much better. Hgb better after blood transfusions. Cardiology cleared patient for EGD.   VITAL SIGNS/ANCILLARY NOTES: **Vital Signs.:   03-Mar-16 07:34  Vital Signs Type Q 8hr  Temperature Temperature (F) 97.6  Celsius 36.4  Temperature Source oral  Pulse Pulse 96  Respirations Respirations 19  Systolic BP Systolic BP 885  Diastolic BP (mmHg) Diastolic BP (mmHg) 72  Mean BP 91  Pulse Ox % Pulse Ox % 98  Pulse Ox Activity Level  At rest  Oxygen Delivery 2L  Telemetry pattern Cardiac Rhythm Normal sinus rhythm; PVC's; pattern reported by Telemetry Clerk; 90   Brief Assessment:  GEN no acute distress   Cardiac Regular   Respiratory clear BS   Lab Results: Routine Chem:  03-Mar-16 10:26   Ammonia, Plasma 32 (Result(s) reported on 11 Jan 2015 at 11:30AM.)  Glucose, Serum  109  BUN  35  Creatinine (comp) 1.00  Sodium, Serum 145  Potassium, Serum 3.7  Chloride, Serum  113  CO2, Serum 25  Calcium (Total), Serum  8.1  Anion Gap 7  Osmolality (calc) 297  eGFR (African American) >60  eGFR (Non-African American) >60 (eGFR values <51m/min/1.73 m2 may be an indication of chronic kidney disease (CKD). Calculated eGFR, using the MRDR Study equation, is useful in  patients with stable renal function. The eGFR calculation will not be reliable in acutely ill patients when serum creatinine is changing rapidly. It is not useful in patients on dialysis. The eGFR calculation may not be applicable to patients at the low and high extremes of body sizes, pregnant women, and vegetarians.)  Routine Coag:  03-Mar-16 05:45   Prothrombin  17.0 (11.4-15.0 NOTE: New Reference Range  12/08/14)  INR 1.4 (INR reference interval applies to patients on anticoagulant therapy. A single INR therapeutic range for coumarins is not optimal for all indications; however, the suggested range for most indications  is 2.0 - 3.0. Exceptions to the INR Reference Range may include: Prosthetic heart valves, acute myocardial infarction, prevention of myocardial infarction, and combinations of aspirin and anticoagulant. The need for a higher or lower target INR must be assessed individually. Reference: The Pharmacology and Management of the Vitamin K  antagonists: the seventh ACCP Conference on Antithrombotic and Thrombolytic Therapy. COYDXA.1287Sept:126 (3suppl): [id number] A HCT value >55% may artifactually increase the PT.  In one study,  the increase was an average of 25%. Reference:  "Effect on Routine and Special Coagulation Testing Values of Citrate Anticoagulant Adjustment in Patients with High HCT Values." American Journal of Clinical Pathology 28676;720:947-096)    10:26   Prothrombin  17.1 (11.4-15.0 NOTE: New Reference Range  12/08/14)  INR 1.4 (INR reference interval applies to patients on anticoagulant therapy. A single INR therapeutic range for coumarins is not optimal for all indications; however, the suggested range for most indications is 2.0 - 3.0. Exceptions to the INR Reference Range may include: Prosthetic heart valves, acute myocardial infarction, prevention of myocardial infarction, and combinations of aspirin and anticoagulant. The need for a higher or lower target INR must be assessed individually. Reference: The Pharmacology and Management of the Vitamin K  antagonists: the seventh ACCP Conference on Antithrombotic and Thrombolytic Therapy. CGEZMO.2947Sept:126 (3suppl): [id number] A HCT value >55% may artifactually increase the PT.  In one study,  the increase was an average of 25%. Reference:  "Effect on Routine and Special Coagulation Testing Values of Citrate Anticoagulant Adjustment in Patients with High  HCT Values." American Journal of Clinical Pathology 4144;360:165-800.)  Routine Hem:  03-Mar-16 10:26   WBC (CBC) 5.0  RBC (CBC)  2.83  Hemoglobin (CBC)  8.9   Hematocrit (CBC)  27.3  Platelet Count (CBC)  129  MCV 97  MCH 31.6  MCHC 32.8  RDW  20.6  Neutrophil % 75.6  Lymphocyte % 10.8  Monocyte % 12.4  Eosinophil % 0.5  Basophil % 0.7  Neutrophil # 3.8  Lymphocyte #  0.5  Monocyte # 0.6  Eosinophil # 0.0  Basophil # 0.0 (Result(s) reported on 11 Jan 2015 at 11:06AM.)   Assessment/Plan:  Assessment/Plan:  Assessment GI bleed. Coagulopathy. Encephalopathy.   Plan Continue meds. Proceed with EGD tomorrow AM.   Electronic Signatures: Verdie Shire (MD)  (Signed 03-Mar-16 13:48)  Authored: Chief Complaint, VITAL SIGNS/ANCILLARY NOTES, Brief Assessment, Lab Results, Assessment/Plan   Last Updated: 03-Mar-16 13:48 by Verdie Shire (MD)

## 2015-03-11 NOTE — Discharge Summary (Signed)
PATIENT NAME:  Jason Larsen, LANTRY MR#:  [id number] DATE OF BIRTH:  1941/06/15  DATE OF ADMISSION:  01/09/2015 DATE OF DISCHARGE:  01/13/2015  DISCHARGE DIAGNOSES:   1.  Acute on chronic anemia. 2.  Chronic atrial fibrillation.  PROCEDURE:   EGD performed on 01/12/2015 which was normal.   CONSULTATIONS: Cardiology Dr. Neoma Laming, gastroenterology, Dr. Candace Cruise.   MEDICATIONS AT THE TIME OF DISCHARGE: Diltiazem 120 mg 1 capsule p.o. once daily in a.m., Spiriva 18 mcg 1 capsule inhalation once daily at bedtime, formoterol 12 mcg inhalation capsule 1 capsule every 12 hours, albuterol CML inhalations every 4 hours as needed for wheezing or cough, allopurinol 300 mg 1 tablet p.o. once daily, amiodarone 200 mg 1 tablet p.o. once daily, losartan 25 mg 1/2 tablet p.o. once daily, omeprazole 20 mg 1 capsule p.o. 2 times a day, gabapentin 300 mg 1 capsule p.o. 3 times a day as needed, simvastatin 20 mg 1/2 tablet p.o. once a day at bedtime, Norco 325/5 at 1 tablet p.o. every 6 hours as needed for pain, cyclobenzaprine 10 mg 1 tablet p.o. 3 times a day as needed, aspirin 81 mg p.o. once daily at bedtime, carbonyl iron 45 mg 1 tablet p.o. 2 times a day, Rifaximin 550 mg 1 tablet p.o. 2 times a day, Colace 100 mg p.o. 2 times a day, discontinued Coumadin.   Discharged home with home health and home physical therapy. Home oxygen 2 liters via nasal cannula as the patient's pulse oximetry was 87% with exertion on room air.   DIET: Low fat, regular consistency.   Follow-up with primary care physician in 1 week, with gastroenterology in 2 weeks, cardiology Dr. Humphrey Rolls on Tuesday 01/16/2015 at 3:00 p.m.   BRIEF HISTORY AND PHYSICAL AND HOSPITAL COURSE BASED ON THE PROBLEM: The patient is a 74 year old male who came into the ED on 01/09/2015 with a chief complaint of weakness. The patient was sent over from his primary care physician, Dr. Elzie Rings office 1 day prior to the admission where he had a CAT scan of the abdomen and  diagnosed with cirrhosis of the liver. Please review history and physical for details. As the patient was feeling weak, the patient was seen by Dr. Baldemar Lenis again, his primary care physician and had sent him to the ED.  Hemoglobin was 6 and ammonia level was 63 in the ED.  The patient was given 1 unit of blood transfusion, by the ED physician and he was admitted to the hospitalist service.  1.  Lower gastrointestinal bleed.  The patient was recently diagnosed with liver cirrhosis. There was a concern that the patient might be having lower GI bleed, which may be worse as he was taking Coumadin for chronic atrial fibrillation.  His Coumadin was held at the time of admission and INR was checked on a daily basis.  Following blood transfusion, his hemoglobin and hematocrit were monitored closely. The patient was kept n.p.o., IV fluids were provided. Gastroenterology was consulted.  Dr. Candace Cruise has seen this patient and the patient had an EGD done on 01/12/2015 which was apparently normal. The patient's hemoglobin was stable, Dr. Candace Cruise has recommended to discharge the patient from his standpoint and to follow up with him as an outpatient for colonoscopy.  Dr. Candace Cruise also has mentioned that he will consider repeat colonoscopy as an outpatient if he presents with recurrent bleeding.  Following the procedure, the patient's diet was advanced and he tolerated it well and he was discharged home. The patient has  received 3 units of PRBC during the hospital course.  2.  Hepatic encephalopathy with ammonia levels were high which was from recently diagnosed chronic liver cirrhosis.  Lactulose was given.  The patient was given Rifaximin and following his mental status was significantly improved.  3.  Chronic atrial fibrillation, rate controlled.  The patient was monitored on telemetry.  The patient was taking amiodarone.  Cardiologist consulted regarding his Coumadin decision.   Coumadin was held at the time of admission in view of anemia  with hemoglobin 6.  INR was elevated at 7. The patient also has received vitamin  K and fresh frozen plasma.  Subsequently, the decision was made to discontinue his warfarin and provide him only enteric-coated baby aspirin on a daily basis for anticoagulation purposes.  4.  Elevated troponin with a history of coronary artery disease and status post bypass.  Dr. Humphrey Rolls has recommended to continue baby aspirin.  The elevated troponin was thought to be from demand ischemia.  As this was from underlying liver cirrhosis, no significant fluid was present as per ultrasound of the abdomen.  5.  The patient had abnormal CAT scan of the head as he was confused and lethargic at the time of admission, but there is no evidence of a stroke on MRI.   6.  Chronic diastolic congestive heart failure uncompensated, no further investigations were done.  7.  Smoking cessation counseling was provided by admitting physician and nicotine patch was offered.  8.  Condition was satisfactory at the time of discharge.   PHYSICAL EXAMINATION:  VITAL SIGNS: On 01/13/2015, temperature 98.7, pulse 90, respirations 18, blood pressure 102/60, pulse oximetry 91% at rest. PT consult was placed regarding deconditioning.  GENERAL APPEARANCE: Not in acute distress. Moderately built and nourished.  HEENT: Normocephalic, atraumatic. Pupils are equally reacting to light and accommodation. No scleral icterus no conjunctivitis. No sinus tenderness. No postnasal drip.  Moist mucous membranes.  NECK: Supple. No JVD.  LUNGS: Clear to auscultation. No accessory muscle use and no wheezing. No rhonchi.   CARDIOVASCULAR: Irregularly irregular. No murmur.  GASTROINTESTINAL: Soft. Bowel sounds are positive in all 4 quadrants. Nontender, nondistended. No masses. NEUROLOGIC:  Awake, alert, oriented x 3.  SKIN:  No rashes.  No ulcers.  LABORATORY DATA AND IMAGING STUDIES:  Glucose 101, BUN 21, creatinine normal on 01/13/2015.  Sodium, potassium is normal.  Anion gap is 6. Calcium 7.8, troponin 0.11. WBC 3.0, hemoglobin 8.8, hematocrit 28.5, platelets are 170.  On the day of discharge, the patient's PT 17.5.  INR is 1.4.   The plan of care was discussed in detail with the patient and her son.  All questions were answered.    TIME SPENT:  Total time spent on the discharge and coordination of care:  45 minutes.      ____________________________ Nicholes Mango, MD ag:DT D: 01/17/2015 23:02:11 ET T: 01/18/2015 08:20:45 ET JOB#: [id number]  cc: Nicholes Mango, MD, <Dictator> Dionisio David, MD Lupita Dawn. Candace Cruise, MD  Nicholes Mango MD ELECTRONICALLY SIGNED 01/29/2015 22:31

## 2015-03-11 NOTE — Consult Note (Signed)
PATIENT NAME:  Jason Larsen, Jason Larsen MR#:  [id number] DATE OF BIRTH:  Dec 06, 1940  DATE OF CONSULTATION:  01/10/2015  REFERRING PHYSICIAN:   CONSULTING PHYSICIAN:  Theodore Demark, NP  REASON FOR CONSULTATION:  GI consult ordered by Dr. Anselm Jungling for evaluation of cirrhosis and GI bleed.   HISTORY OF PRESENT ILLNESS: Appreciate consult for a 74 year old Caucasian man with history of newly diagnosed cirrhosis, COPD, bronchitis, CVA, CHF, atrial fibrillation on Coumadin therapy, peptic ulcer disease, CAD, gout, diabetes for evaluation of cirrhosis and GI bleeding. The patient is limited historian due to intermittent confusion, altered mental status, son and daughter-in-law at bedside but unable to give history. History gained through chart review. Evidently the patient was doing okay at home, developed confusion, weakness, and lower back pain. Went to PCP office and was sent to ED, subsequently admitted with a hemoglobin of 6, PT 64.5, INR 7.7, BUN elevated with normal creatinine, GFR greater than 60, platelets 143,000, ammonia elevated, no ultrasound or liver panel yet.  There have been no GI complaints other than dark stools. No alcohol in years per family report.  He is on omeprazole 20 mg p.o. b.i.d. and iron therapy at home. Coumadin has been held this visit and the patient has received both FFP and PRBCs, currently receiving more packed red blood cells now. In further chart review the patient had a similar episode in 2014, treated empirically for peptic ulcer disease as it was thought that his respiratory status at the time with unstable, once hemoglobin improved his confusion ceased.  Had EGD and colonoscopy 2009 in [place] although I do not have these records.  Did have colonoscopy here in 2006 by Dr. Sonny Masters with 2 polyps. This visit the patient also with elevated troponins, EKG suggested anterior and inferior ischemia. CT head with 2 old infarcts, 1 infarct to the right frontal area possibly new.  He  has been noted to have some abnormal jerking movements today and has been getting short of breath easily while talking.  REVIEW OF SYSTEMS:  Unable to obtain due to patient confusion.   PAST MEDICAL HISTORY: COPD, hyperlipidemia, gout, coronary artery disease, hypertension, arthritis, atrial fibrillation, GI bleed, erectile dysfunction, diabetes type 2, gross hematuria, bladder cancer status post chemotherapy, chronic CHF, alcohol abuse in the past, CABG, and right hip replacement.   SOCIAL HISTORY: Lives at home. Walks with cane. Smokes about 10 cigarettes a day. Until this episode has been able to care for self well.   FAMILY HISTORY: Father deceased due to emphysema, mother from brain cancer and heart disease.   MEDICATIONS: Albuterol 2.5 by nebulizer q. 4 h. p.r.n. wheezing, allopurinol 300 mg once a day, amiodarone 200 mg once a day, ASA 81 mg once a day, Cardizem 120 extended release p.o. daily, formoterol 12 mcg inhaled twice a day, gabapentin 300 mg at night, iron 45 mg once a day, losartan 25 mg half tablet once a day, omeprazole 20 mg b.i.d., simvastatin 20 mg p.o. at bedtime, Spiriva 18 mcg once a day.    ALLERGIES: No known allergies.   LABORATORY DATA: Most recent laboratories, glucose 125, BUN 43, creatinine 1.21, sodium 144, potassium 3.9, chloride 111, GFR greater than 60, calcium 7.5. Ammonia 50. Troponin elevated x 3. WBC 4.1, hemoglobin 5.7, hematocrit 18.4, platelet count 143,000, slightly hypochromic. PT 64.5, INR 7.7. CT head as noted above. EKG as noted above.   PHYSICAL EXAMINATION:  VITAL SIGNS: Most recent vital signs, temperature 98, pulse 91, respiratory rate 20, blood pressure  99/58, SaO2 98% on 2 liters.  GENERAL: Chronically ill-appearing man in no acute distress.  HEENT: Normocephalic, atraumatic. His conjunctivae are pale. His sclerae are clear. He has no teeth.  NECK: Supple. No JVD or thyromegaly.  CHEST: Does get shortness of breath easily with talking, this  relieves with rest. Respirations are somewhat shallow. He has bilateral crackles and wheezes throughout. He has a mild cough. He is able to speak in complete sentences.  ABDOMEN: Protuberant abdomen, slightly distended. Bowel sounds x 4. Nontender. No guarding, rigidity, peritoneal signs, or other abnormalities. There was also no hepatosplenomegaly or masses.  RECTAL: Stool black, Hemoccult positive. Several external hemorrhoids.  SKIN: Warm, dry, pale. No jaundice. No erythema, lesion, or rash. Does have tattoos.  EXTREMITIES: MAEW x 4. Does have some abnormal jerking to his elbows and shoulders as he tries to move, however he is successfully able to negotiate his position, some generalized weakness.  NEUROLOGICAL: Alert, somewhat confused. Speech garbled, however I believe this may be due to his edentulous state. Follows commands. There is some asterixis.  PSYCHIATRIC: Pleasant, cooperative.   IMPRESSION AND PLAN:  Anemia exacerbation, melena, Hemoccult  positive, possible peptic ulcer disease versus other. Agree with coagulation correction, transfusion, and following hemoglobin. I am going to increase his PPI to IV b.i.d. We will discuss EGD with Dr. Candace Cruise. Fletcher Anon, ultrasound of the abdomen with Dopplers and liver panel. Agree with daily PT/INR. Did discuss his cardiac and neurologic status with Dr. Manuella Ghazi, says he will order a cardiology and neurologic consult. The patient will need to be clinically stable in order to tolerate sedated procedures.   Thank you very much for this consult. These services were provided by Stephens November, MSN, [facility], in collaboration with Verdie Shire, MD, with whom I have discussed this patient in full.    ____________________________ Theodore Demark, NP chl:bu D: 01/10/2015 15:11:08 ET T: 01/10/2015 15:19:40 ET JOB#: [id number]  cc: Theodore Demark, NP, <Dictator> [place] SIGNED 01/11/2015 10:13

## 2015-04-04 ENCOUNTER — Observation Stay
Admission: EM | Admit: 2015-04-04 | Discharge: 2015-04-05 | Disposition: A | Discharge status: Home / Self Care | Payer: Commercial Managed Care - HMO | Attending: Specialist | Admitting: Specialist

## 2015-04-04 ENCOUNTER — Emergency Department: Payer: Commercial Managed Care - HMO

## 2015-04-04 ENCOUNTER — Encounter: Payer: Self-pay | Admitting: Emergency Medicine

## 2015-04-04 DIAGNOSIS — R197 Diarrhea, unspecified: Secondary | ICD-10-CM | POA: Diagnosis not present

## 2015-04-04 DIAGNOSIS — Z86010 Personal history of colonic polyps: Secondary | ICD-10-CM | POA: Diagnosis not present

## 2015-04-04 DIAGNOSIS — Z8551 Personal history of malignant neoplasm of bladder: Secondary | ICD-10-CM | POA: Diagnosis not present

## 2015-04-04 DIAGNOSIS — I2510 Atherosclerotic heart disease of native coronary artery without angina pectoris: Secondary | ICD-10-CM | POA: Diagnosis not present

## 2015-04-04 DIAGNOSIS — K648 Other hemorrhoids: Secondary | ICD-10-CM | POA: Diagnosis not present

## 2015-04-04 DIAGNOSIS — T673XXA Heat exhaustion, anhydrotic, initial encounter: Secondary | ICD-10-CM | POA: Diagnosis not present

## 2015-04-04 DIAGNOSIS — Z951 Presence of aortocoronary bypass graft: Secondary | ICD-10-CM | POA: Insufficient documentation

## 2015-04-04 DIAGNOSIS — Z96641 Presence of right artificial hip joint: Secondary | ICD-10-CM | POA: Insufficient documentation

## 2015-04-04 DIAGNOSIS — Z79899 Other long term (current) drug therapy: Secondary | ICD-10-CM | POA: Diagnosis not present

## 2015-04-04 DIAGNOSIS — E860 Dehydration: Secondary | ICD-10-CM | POA: Diagnosis present

## 2015-04-04 DIAGNOSIS — R55 Syncope and collapse: Secondary | ICD-10-CM | POA: Diagnosis not present

## 2015-04-04 DIAGNOSIS — Z8249 Family history of ischemic heart disease and other diseases of the circulatory system: Secondary | ICD-10-CM | POA: Diagnosis not present

## 2015-04-04 DIAGNOSIS — I6529 Occlusion and stenosis of unspecified carotid artery: Secondary | ICD-10-CM | POA: Diagnosis not present

## 2015-04-04 DIAGNOSIS — K921 Melena: Secondary | ICD-10-CM | POA: Diagnosis not present

## 2015-04-04 DIAGNOSIS — I509 Heart failure, unspecified: Secondary | ICD-10-CM | POA: Insufficient documentation

## 2015-04-04 DIAGNOSIS — T675XXA Heat exhaustion, unspecified, initial encounter: Secondary | ICD-10-CM | POA: Diagnosis present

## 2015-04-04 DIAGNOSIS — Z9981 Dependence on supplemental oxygen: Secondary | ICD-10-CM | POA: Insufficient documentation

## 2015-04-04 DIAGNOSIS — R509 Fever, unspecified: Secondary | ICD-10-CM | POA: Insufficient documentation

## 2015-04-04 DIAGNOSIS — R011 Cardiac murmur, unspecified: Secondary | ICD-10-CM | POA: Diagnosis not present

## 2015-04-04 DIAGNOSIS — Y9389 Activity, other specified: Secondary | ICD-10-CM | POA: Insufficient documentation

## 2015-04-04 DIAGNOSIS — K219 Gastro-esophageal reflux disease without esophagitis: Secondary | ICD-10-CM | POA: Insufficient documentation

## 2015-04-04 DIAGNOSIS — R42 Dizziness and giddiness: Secondary | ICD-10-CM | POA: Insufficient documentation

## 2015-04-04 DIAGNOSIS — R404 Transient alteration of awareness: Secondary | ICD-10-CM | POA: Diagnosis present

## 2015-04-04 DIAGNOSIS — X58XXXA Exposure to other specified factors, initial encounter: Secondary | ICD-10-CM | POA: Insufficient documentation

## 2015-04-04 DIAGNOSIS — Z85828 Personal history of other malignant neoplasm of skin: Secondary | ICD-10-CM | POA: Diagnosis not present

## 2015-04-04 DIAGNOSIS — M109 Gout, unspecified: Secondary | ICD-10-CM | POA: Diagnosis not present

## 2015-04-04 DIAGNOSIS — E876 Hypokalemia: Secondary | ICD-10-CM | POA: Insufficient documentation

## 2015-04-04 DIAGNOSIS — J449 Chronic obstructive pulmonary disease, unspecified: Secondary | ICD-10-CM | POA: Diagnosis not present

## 2015-04-04 DIAGNOSIS — M1610 Unilateral primary osteoarthritis, unspecified hip: Secondary | ICD-10-CM | POA: Insufficient documentation

## 2015-04-04 DIAGNOSIS — Z9181 History of falling: Secondary | ICD-10-CM | POA: Diagnosis not present

## 2015-04-04 DIAGNOSIS — Z8489 Family history of other specified conditions: Secondary | ICD-10-CM | POA: Insufficient documentation

## 2015-04-04 DIAGNOSIS — F17210 Nicotine dependence, cigarettes, uncomplicated: Secondary | ICD-10-CM | POA: Insufficient documentation

## 2015-04-04 DIAGNOSIS — Z7951 Long term (current) use of inhaled steroids: Secondary | ICD-10-CM | POA: Insufficient documentation

## 2015-04-04 DIAGNOSIS — R188 Other ascites: Secondary | ICD-10-CM | POA: Diagnosis not present

## 2015-04-04 DIAGNOSIS — R4182 Altered mental status, unspecified: Secondary | ICD-10-CM | POA: Insufficient documentation

## 2015-04-04 DIAGNOSIS — I482 Chronic atrial fibrillation: Secondary | ICD-10-CM | POA: Insufficient documentation

## 2015-04-04 DIAGNOSIS — D649 Anemia, unspecified: Secondary | ICD-10-CM | POA: Insufficient documentation

## 2015-04-04 DIAGNOSIS — E785 Hyperlipidemia, unspecified: Secondary | ICD-10-CM | POA: Diagnosis not present

## 2015-04-04 DIAGNOSIS — Z7901 Long term (current) use of anticoagulants: Secondary | ICD-10-CM | POA: Insufficient documentation

## 2015-04-04 DIAGNOSIS — Z836 Family history of other diseases of the respiratory system: Secondary | ICD-10-CM | POA: Insufficient documentation

## 2015-04-04 DIAGNOSIS — E1151 Type 2 diabetes mellitus with diabetic peripheral angiopathy without gangrene: Secondary | ICD-10-CM | POA: Diagnosis not present

## 2015-04-04 HISTORY — DX: Encounter for other specified aftercare: Z51.89

## 2015-04-04 LAB — BLOOD GAS, ARTERIAL
Acid-Base Excess: 3.9 mmol/L — ABNORMAL HIGH (ref 0.0–3.0)
BICARBONATE: 28.5 meq/L — AB (ref 21.0–28.0)
FIO2: 28 %
O2 Saturation: 95 %
PO2 ART: 73 mmHg — AB (ref 83.0–108.0)
Patient temperature: 37
pCO2 arterial: 42 mmHg (ref 32.0–48.0)
pH, Arterial: 7.44 (ref 7.350–7.450)

## 2015-04-04 LAB — CBC WITH DIFFERENTIAL/PLATELET
Basophils Absolute: 0 10*3/uL (ref 0–0.1)
Basophils Relative: 0 %
EOS ABS: 0 10*3/uL (ref 0–0.7)
Eosinophils Relative: 1 %
HCT: 30.6 % — ABNORMAL LOW (ref 40.0–52.0)
Hemoglobin: 10.1 g/dL — ABNORMAL LOW (ref 13.0–18.0)
Lymphocytes Relative: 9 %
Lymphs Abs: 0.6 10*3/uL — ABNORMAL LOW (ref 1.0–3.6)
MCH: 31.6 pg (ref 26.0–34.0)
MCHC: 33.2 g/dL (ref 32.0–36.0)
MCV: 95.3 fL (ref 80.0–100.0)
MONOS PCT: 11 %
Monocytes Absolute: 0.8 10*3/uL (ref 0.2–1.0)
NEUTROS ABS: 5.9 10*3/uL (ref 1.4–6.5)
Neutrophils Relative %: 79 %
Platelets: 135 10*3/uL — ABNORMAL LOW (ref 150–440)
RBC: 3.21 MIL/uL — AB (ref 4.40–5.90)
RDW: 21.1 % — ABNORMAL HIGH (ref 11.5–14.5)
WBC: 7.4 10*3/uL (ref 3.8–10.6)

## 2015-04-04 LAB — COMPREHENSIVE METABOLIC PANEL
ALT: 27 U/L (ref 17–63)
AST: 33 U/L (ref 15–41)
Albumin: 2.9 g/dL — ABNORMAL LOW (ref 3.5–5.0)
Alkaline Phosphatase: 156 U/L — ABNORMAL HIGH (ref 38–126)
Anion gap: 8 (ref 5–15)
BUN: 33 mg/dL — AB (ref 6–20)
CO2: 28 mmol/L (ref 22–32)
CREATININE: 0.88 mg/dL (ref 0.61–1.24)
Calcium: 8.7 mg/dL — ABNORMAL LOW (ref 8.9–10.3)
Chloride: 102 mmol/L (ref 101–111)
GFR calc non Af Amer: >60 mL/min (ref >60)
Glucose, Bld: 139 mg/dL — ABNORMAL HIGH (ref 65–99)
POTASSIUM: 4.6 mmol/L (ref 3.5–5.1)
SODIUM: 138 mmol/L (ref 135–145)
Total Bilirubin: 1.1 mg/dL (ref 0.3–1.2)
Total Protein: 6.5 g/dL (ref 6.5–8.1)

## 2015-04-04 LAB — LACTIC ACID, PLASMA
Lactic Acid, Venous: 1.5 mmol/L (ref 0.5–2.0)
Lactic Acid, Venous: 1 mmol/L (ref 0.5–2.0)

## 2015-04-04 LAB — TROPONIN I: Troponin I: <0.03 ng/mL (ref <0.031)

## 2015-04-04 LAB — PROTIME-INR
INR: 1.17
PROTHROMBIN TIME: 15.1 s — AB (ref 11.4–15.0)

## 2015-04-04 MED ORDER — NICOTINE 7 MG/24HR TD PT24
7.0000 mg | MEDICATED_PATCH | Freq: Every day | TRANSDERMAL | Status: DC
  Administered 2015-04-05 (×2): 7 mg via TRANSDERMAL
  Filled 2015-04-04 (×2): qty 1

## 2015-04-04 MED ORDER — HEPARIN SODIUM (PORCINE) 5000 UNIT/ML IJ SOLN
5000.0000 [IU] | Freq: Three times a day (TID) | INTRAMUSCULAR | Status: DC
  Administered 2015-04-04 – 2015-04-05 (×3): 5000 [IU] via SUBCUTANEOUS
  Filled 2015-04-04 (×3): qty 1

## 2015-04-04 MED ORDER — ALBUTEROL SULFATE (2.5 MG/3ML) 0.083% IN NEBU: 2.5000 mg | INHALATION_SOLUTION | RESPIRATORY_TRACT | Status: DC | PRN

## 2015-04-04 MED ORDER — DILTIAZEM HCL ER 120 MG PO CP24
120.0000 mg | ORAL_CAPSULE | Freq: Every morning | ORAL | Status: DC
  Administered 2015-04-05: 09:00:00 120 mg via ORAL
  Filled 2015-04-04 (×2): qty 1

## 2015-04-04 MED ORDER — PANTOPRAZOLE SODIUM 40 MG PO TBEC
40.0000 mg | DELAYED_RELEASE_TABLET | Freq: Every day | ORAL | Status: DC
  Administered 2015-04-05: 09:00:00 40 mg via ORAL
  Filled 2015-04-04: qty 1

## 2015-04-04 MED ORDER — CYCLOBENZAPRINE HCL 10 MG PO TABS
5.0000 mg | ORAL_TABLET | Freq: Every day | ORAL | Status: DC
Start: 2015-04-04 — End: 2015-04-05
  Administered 2015-04-05: 01:00:00 via ORAL
  Filled 2015-04-04: qty 1

## 2015-04-04 MED ORDER — ACETAMINOPHEN 325 MG PO TABS: 650.0000 mg | ORAL_TABLET | Freq: Four times a day (QID) | ORAL | Status: DC | PRN

## 2015-04-04 MED ORDER — ALLOPURINOL 100 MG PO TABS
300.0000 mg | ORAL_TABLET | Freq: Every day | ORAL | Status: DC
  Administered 2015-04-05: 300 mg via ORAL
  Filled 2015-04-04: qty 3

## 2015-04-04 MED ORDER — GABAPENTIN 300 MG PO CAPS: 600.0000 mg | ORAL_CAPSULE | Freq: Three times a day (TID) | ORAL | Status: DC | PRN

## 2015-04-04 MED ORDER — AMIODARONE HCL 200 MG PO TABS
100.0000 mg | ORAL_TABLET | Freq: Every day | ORAL | Status: DC
  Administered 2015-04-05: 09:00:00 100 mg via ORAL
  Filled 2015-04-04: qty 2

## 2015-04-04 MED ORDER — ACETAMINOPHEN 650 MG RE SUPP: 650.0000 mg | Freq: Four times a day (QID) | RECTAL | Status: DC | PRN

## 2015-04-04 MED ORDER — ONDANSETRON HCL 4 MG/2ML IJ SOLN: 4.0000 mg | Freq: Four times a day (QID) | INTRAMUSCULAR | Status: DC | PRN

## 2015-04-04 MED ORDER — SODIUM CHLORIDE 0.9 % IV SOLN
INTRAVENOUS | Status: DC
  Administered 2015-04-04: 19:00:00 via INTRAVENOUS

## 2015-04-04 MED ORDER — ONDANSETRON HCL 4 MG PO TABS: 4.0000 mg | ORAL_TABLET | Freq: Four times a day (QID) | ORAL | Status: DC | PRN

## 2015-04-04 MED ORDER — TIOTROPIUM BROMIDE-OLODATEROL 2.5-2.5 MCG/ACT IN AERS: 2.0000 | INHALATION_SPRAY | Freq: Every day | RESPIRATORY_TRACT | Status: DC

## 2015-04-04 NOTE — ED Notes (Signed)
Pt from home via [facility] after being found unresponsive in yard. EMS reports wife left house around 1230pm, came back and found slumped over.EMS reports oral temp of 103. Pt arrived with ice packs to groin and axillary areas. CBG 117, VSS.

## 2015-04-04 NOTE — Plan of Care (Signed)
Problem: Discharge Progression Outcomes Goal: Discharge plan in place and appropriate Outcome: Progressing Patient admitted for heat exhaustion obs, VSS, IVF infusing, possible D/C tomorrow

## 2015-04-04 NOTE — ED Provider Notes (Signed)
[facility] Emergency Department Provider Note     Time seen: ----------------------------------------- 1:39 PM on 04/04/2015 -----------------------------------------    I have reviewed the triage vital signs and the nursing notes. Level V caveat: Review of systems and history is difficult to obtain due to altered mental status  HISTORY  Chief Complaint Altered Mental Status    HPI Jason Larsen is a 74 y.o. male right ear emergency traffic after being found unresponsive slumped over on his part bandage. Surrounding 90 outside the time of this event. He did not there for approximately an hour according to the wife. Temperature is 103. Patient is mildly responsive, we'll await and intimately follow commands and speak, patient states "I lost my help".     Past Medical History  Diagnosis Date  . Coronary heart disease   . Hyperlipidemia   . HTN (hypertension)   . COPD (chronic obstructive pulmonary disease)   . PVD (peripheral vascular disease)   . Atrial fibrillation     converted to NSR 5/09 on pacerone  . Shortness of breath     on exertion  . Diabetes mellitus without complication   . Arthritis     fingers  . Anemia   . DJD (degenerative joint disease) of hip   . GI bleed     01/2013 while on warfarin with a hemoglobin of 5.8 (INR was 8)  . CHF (congestive heart failure)     EF 50-55%  . Carotid artery occlusion   . Complication of anesthesia   . PONV (postoperative nausea and vomiting)   . Cataracts, bilateral     Hx: of  . Heart murmur   . Pneumonia     Hx: of  . Cancer of kidney   . Skin cancer 12/2013    Rt. ear  . Bladder cancer   . Fall at home Dec. 2015    Patient Active Problem List   Diagnosis Date Noted  . Discoloration of skin-Bilat. foot 03/01/2015  . Ascites 01/19/2015  . Aftercare following surgery of the circulatory system, [place] 07/07/2014  . Diarrhea 01/12/2014  . Internal hemorrhoids without mention of  complication 82/95/6213  . Encounter for therapeutic drug monitoring 12/14/2013  . Dizziness and giddiness 06/10/2013  . Occlusion and stenosis of carotid artery without mention of cerebral infarction 06/10/2013  . Long term (current) use of anticoagulants 05/25/2013  . Left carotid bruit 05/16/2013  . Hypokalemia 03/29/2013  . AVM (arteriovenous malformation) of colon with hemorrhage 02/23/2013  . Benign neoplasm of colon 02/23/2013  . Blood in stool 02/09/2013  . Personal history of colonic polyps 02/09/2013  . Smoker 11/06/2011  . ALLERGIC RHINITIS 03/14/2008  . Hyperlipidemia 02/11/2008  . ANEMIA 02/11/2008  . Essential hypertension 02/11/2008  . Coronary atherosclerosis 02/11/2008  . Atrial fibrillation 02/11/2008  . PERIPHERAL VASCULAR DISEASE 02/11/2008  . CHRONIC OBSTRUCTIVE PULMONARY DISEASE, SEVERE [place] C 02/11/2008    Past Surgical History  Procedure Laterality Date  . Tonsillectomy      as child  . Colonoscopy N/A 02/23/2013    Procedure: COLONOSCOPY;  Surgeon: Inda Castle, MD;  Location: [facility];  Service: Endoscopy;  Laterality: N/A;  . Esophagogastroduodenoscopy N/A 02/23/2013    Procedure: ESOPHAGOGASTRODUODENOSCOPY (EGD);  Surgeon: Inda Castle, MD;  Location: [facility];  Service: Endoscopy;  Laterality: N/A;  . Hot hemostasis N/A 05/04/2013    Procedure: HOT HEMOSTASIS (ARGON PLASMA COAGULATION/BICAP);  Surgeon: Inda Castle, MD;  Location: [facility];  Service: Endoscopy;  Laterality:  N/A;  . Enteroscopy N/A 05/04/2013    Procedure: ENTEROSCOPY;  Surgeon: Inda Castle, MD;  Location: [facility];  Service: Endoscopy;  Laterality: N/A;  . Bypass graft  1997 ?  Marland Kitchen Coronary artery bypass graft      15-17 yrs  . Joint replacement Right      Hip 10-12 yrs  . Cardiac catheterization    . Endarterectomy Left 06/15/2013    Procedure: ENDARTERECTOMY CAROTID- LEFT;  Surgeon: Conrad Loretto, MD;  Location: [facility];  Service: Vascular;  Laterality: Left;   . Patch angioplasty Left 06/15/2013    Procedure: PATCH ANGIOPLASTY using Vascu-Guard Vascular Patch;  Surgeon: Conrad Buffalo, MD;  Location: [facility];  Service: Vascular;  Laterality: Left;  . Kidney surgery  Aug. 2015    Chemo-Bladder   . Cataract extraction, bilateral  2015  . Skin cancer excision Right Feb. 2015    Ear  . Carotid endarterectomy Left 06-15-13    cea  . Eye surgery      Current Outpatient Rx  Name  Route  Sig  Dispense  Refill  . albuterol (PROVENTIL) (2.5 MG/3ML) 0.083% nebulizer solution   Nebulization   Take 2.5 mg by nebulization every 4 (four) hours as needed for shortness of breath.          . allopurinol (ZYLOPRIM) 300 MG tablet   Oral   Take 150 mg by mouth every morning. Gout         . amiodarone (PACERONE) 200 MG tablet   Oral   Take 100 mg by mouth daily.          Marland Kitchen atorvastatin (LIPITOR) 10 MG tablet               . carbonyl iron (FEOSOL) 45 MG TABS tablet   Oral   Take 45 mg by mouth 2 (two) times daily with a meal.         . cyclobenzaprine (FLEXERIL) 10 MG tablet   Oral   Take 10 mg by mouth as needed for muscle spasms.         Marland Kitchen diltiazem (DILACOR XR) 120 MG 24 hr capsule   Oral   Take 1 capsule (120 mg total) by mouth every morning.   90 capsule   3   . docusate sodium (COLACE) 100 MG capsule   Oral   Take 100 mg by mouth 2 (two) times daily.         . ferrous sulfate 325 (65 FE) MG tablet   Oral   Take 325 mg by mouth daily with breakfast.         . formoterol (FORADIL) 12 MCG capsule for inhaler   Inhalation   Place 1 capsule (12 mcg total) into inhaler and inhale 2 (two) times daily as needed for wheezing.   60 capsule   1   . furosemide (LASIX) 20 MG tablet   Oral   Take 20 mg by mouth daily.         Marland Kitchen gabapentin (NEURONTIN) 300 MG capsule   Oral   Take 300 mg by mouth 3 (three) times daily as needed.         Marland Kitchen HYDROcodone-acetaminophen (NORCO/VICODIN) 5-325 MG per tablet   Oral   Take 1 tablet  by mouth every 4 (four) hours as needed for moderate pain.         Marland Kitchen lactulose (CEPHULAC) 10 G packet   Oral   Take 10 g by mouth  3 (three) times daily.         Marland Kitchen omeprazole (PRILOSEC) 20 MG capsule   Oral   Take 20 mg by mouth every morning.          . rifaximin (XIFAXAN) 550 MG TABS tablet   Oral   Take 550 mg by mouth 2 (two) times daily.         . simvastatin (ZOCOR) 20 MG tablet   Oral   Take 10 mg by mouth daily.         Marland Kitchen spironolactone (ALDACTONE) 50 MG tablet   Oral   Take 50 mg by mouth daily.         Marland Kitchen tiotropium (SPIRIVA) 18 MCG inhalation capsule   Inhalation   Place 1 capsule (18 mcg total) into inhaler and inhale daily after supper.   30 capsule   1     Allergies Review of patient's allergies indicates no known allergies.  Family History  Problem Relation Age of Onset  . Emphysema Father   . Heart disease Father   . Cancer Mother     unknwn type  . Tuberculosis Paternal Grandfather   . Peripheral vascular disease Sister   . Hyperlipidemia Brother   . Colon cancer Neg Hx     Social History History  Substance Use Topics  . Smoking status: Light Tobacco Smoker -- 0.25 packs/day    Types: Cigarettes  . Smokeless tobacco: Never Used     Comment: Started smoking at age 22.  Tobacco info given 01/12/14  . Alcohol Use: No     Comment: Beer -haven't drank since Dec 2013    Review of Systems Constitutional: Fever noted in route  Skin: Skin erythema noted by EMS Neurological: Weakness noted by EMS  10-point ROS otherwise negative.  ____________________________________________   PHYSICAL EXAM:  VITAL SIGNS: ED Triage Vitals  Enc Vitals Group     BP 04/04/15 1334 132/51 mmHg     Pulse Rate 04/04/15 1334 107     Resp 04/04/15 1334 26     Temp 04/04/15 1334 102.8 F (39.3 C)     Temp Source 04/04/15 1334 Rectal     SpO2 04/04/15 1334 88 %     Weight 04/04/15 1334 134 lb (60.782 kg)     Height --      Head Cir --      Peak  Flow --      Pain Score --      Pain Loc --      Pain Edu? --      Excl. in [place]? --     Constitutional: Lethargic and intermittently responsive Mild to moderate distress, Eyes: Conjunctivae are normal. PERRL. Normal extraocular movements. ENT   Head: Normocephalic and atraumatic.   Nose: No congestion/rhinnorhea.   Mouth/Throat: Mucous membranes are moist.   Neck: No stridor. Hematological/Lymphatic/Immunilogical: No cervical lymphadenopathy. Cardiovascular: Rapid rate, irregular rhythm Normal and symmetric distal pulses are present in all extremities. No murmurs, rubs, or gallops. Respiratory: Normal respiratory effort without tachypnea nor retractions. Breath sounds are clear and equal bilaterally. Mild wheezing Gastrointestinal: Soft and nontender. No distention. No abdominal bruits. There is no CVA tenderness. Musculoskeletal: Nontender with normal range of motion in all extremities. No joint effusions.  No lower extremity tenderness nor edema. Neurologic: Patient resting with eyes closed until stimulated, GCS is 13, E3, V4, M6. Disoriented Skin: Skin hot to touch, erythematous Psychiatric: Speech and behavior is confused  ____________________________________________    LABS (pertinent  positives/negatives)  Labs Reviewed  CBC WITH DIFFERENTIAL/PLATELET - Abnormal; Notable for the following:    RBC 3.21 (*)    Hemoglobin 10.1 (*)    HCT 30.6 (*)    RDW 21.1 (*)    Platelets 135 (*)    Lymphs Abs 0.6 (*)    All other components within normal limits  COMPREHENSIVE METABOLIC PANEL - Abnormal; Notable for the following:    Glucose, Bld 139 (*)    BUN 33 (*)    Calcium 8.7 (*)    Albumin 2.9 (*)    Alkaline Phosphatase 156 (*)    All other components within normal limits  BLOOD GAS, ARTERIAL - Abnormal; Notable for the following:    pO2, Arterial 73 (*)    Bicarbonate 28.5 (*)    Acid-Base Excess 3.9 (*)    All other components within normal limits   PROTIME-INR - Abnormal; Notable for the following:    Prothrombin Time 15.1 (*)    All other components within normal limits  CULTURE, BLOOD (ROUTINE X 2)  CULTURE, BLOOD (ROUTINE X 2)  URINE CULTURE  TROPONIN I  LACTIC ACID, PLASMA  URINALYSIS COMPLETEWITH MICROSCOPIC ([facility])   LACTIC ACID, PLASMA    EKG: Interpreted by me. Sinus tachycardia with first-degree AV block, frequent PVCs in a pattern of bigeminy, right axis deviation, long QT rate is 106.  ____________________________________________  ED COURSE:  Pertinent labs & imaging results that were available during my care of the patient were reviewed by me and considered in my medical decision making (see chart for details). Patient appears critically ill, either code sepsis or heat stroke versus exhaustion. We'll attempt cooling measures, check labs cultures and head CT.  ____________________________________________   RADIOLOGY  CT head, chest x-ray  IMPRESSION: Atrophy and chronic microvascular ischemia. No acute abnormality.  IMPRESSION: In comparison with prior studies, there is increased opacity in the region of the anterior left first rib. This opacity may be due to a combination of portable technique and exostosis in this area. An underlying parenchymal lung lesion, however, cannot be excluded on this study. If patient is clinically able, an apical lordotic chest radiograph could be helpful to exclude a mass in the left apex. If patient cannot cooperate for apical lordotic study, noncontrast enhanced chest CT could also be helpful to further evaluate this area and would be advised.  No edema or consolidation. Underlying emphysematous change.____________________________________________  CRITICAL CARE Performed by: Earleen Newport   Total critical care time: 30 minutes  Critical care time was exclusive of separately billable procedures and treating other patients.  Critical care was necessary to  treat or prevent imminent or life-threatening deterioration.  Critical care was time spent personally by me on the following activities: development of treatment plan with patient and/or surrogate as well as nursing, discussions with consultants, evaluation of patient's response to treatment, examination of patient, obtaining history from patient or surrogate, ordering and performing treatments and interventions, ordering and review of laboratory studies, ordering and review of radiographic studies, pulse oximetry and re-evaluation of patient's condition.  FINAL ASSESSMENT AND PLAN  Fever and altered mental status Heat exhaustion  Plan: Patient's labs look remarkably good, his fever is trending down, vital signs are stable this time. Patient was requesting CT that was scheduled as an outpatient for tomorrow to be dental his in the hospital. Due to his chest x-ray he will probably need CT chest and pelvis to evaluate for cancer recurrence. There is a possible mass in  the left upper lobe. Patient describes a heat exhaustion type picture, fortunately arrived prior to heat stroke or severe hyperthermia. Recommend observation, continue hydration and neuro checks. At this point he does not appear to be septic or have any acute infectious etiology for symptoms.    Earleen Newport, MD   Earleen Newport, MD 04/04/15 [id number]

## 2015-04-04 NOTE — Plan of Care (Signed)
Problem: Discharge Progression Outcomes Goal: Pain controlled with appropriate interventions Outcome: Progressing Patient had no c/o pain this shift Goal: Hemodynamically stable Patient VS are stable  Goal: Complications resolved/controlled Patient is A&O and ate well for dinner  Goal: Tolerating diet Patient is tolerating diet well  Goal: Activity appropriate for discharge plan Outcome: Progressing Patient is a stand by one person  to bathroom, no falls this shift Goal: Other Discharge Outcomes/Goals Outcome: Progressing Patient was admitted for observation due to heat exhaustion possible discharge tomorrow

## 2015-04-04 NOTE — H&P (Addendum)
[facility] NAME: Jason Larsen    MR#:  [id number]  DATE OF BIRTH:  Jan 12, 1941  DATE OF ADMISSION:  04/04/2015  PRIMARY CARE PHYSICIAN: BABAOFF, Caryl Bis, MD   REQUESTING/REFERRING PHYSICIAN: Dr. Jimmye Norman  CHIEF COMPLAINT:   Chief Complaint  Patient presents with  . Altered Mental Status    unresponsive    HISTORY OF PRESENT ILLNESS:  Jason Larsen  is a 74 y.o. male with a known history of CAD, hypertension, COPD, A. Fib, diabetes and PVD. The patient was sent to ED due to syncope episode today. The patient was found unresponsive outside this morning. Patient was outside for about half an hour with a surrounding temperature about 90. The patient the body temperature was 103 in ED. He was mildly responsive in ED.  Now the patient is alert awake oriented. He feels dehydrated and dizzy. But he denies any other symptoms. Dr. Jimmye Norman, the ED physician, admitted patient for heat exhaustion.  PAST MEDICAL HISTORY:   Past Medical History  Diagnosis Date  . Coronary heart disease   . Hyperlipidemia   . HTN (hypertension)   . COPD (chronic obstructive pulmonary disease)   . PVD (peripheral vascular disease)   . Atrial fibrillation     converted to NSR 5/09 on pacerone  . Shortness of breath     on exertion  . Diabetes mellitus without complication   . Arthritis     fingers  . Anemia   . DJD (degenerative joint disease) of hip   . GI bleed     01/2013 while on warfarin with a hemoglobin of 5.8 (INR was 8)  . CHF (congestive heart failure)     EF 50-55%  . Carotid artery occlusion   . Complication of anesthesia   . PONV (postoperative nausea and vomiting)   . Cataracts, bilateral     Hx: of  . Heart murmur   . Pneumonia     Hx: of  . Cancer of kidney   . Skin cancer 12/2013    Rt. ear  . Bladder cancer   . Fall at home Dec. 2015    PAST SURGICAL HISTORY:   Past Surgical History  Procedure Laterality Date  .  Tonsillectomy      as child  . Colonoscopy N/A 02/23/2013    Procedure: COLONOSCOPY;  Surgeon: Inda Castle, MD;  Location: [facility];  Service: Endoscopy;  Laterality: N/A;  . Esophagogastroduodenoscopy N/A 02/23/2013    Procedure: ESOPHAGOGASTRODUODENOSCOPY (EGD);  Surgeon: Inda Castle, MD;  Location: [facility];  Service: Endoscopy;  Laterality: N/A;  . Hot hemostasis N/A 05/04/2013    Procedure: HOT HEMOSTASIS (ARGON PLASMA COAGULATION/BICAP);  Surgeon: Inda Castle, MD;  Location: [facility];  Service: Endoscopy;  Laterality: N/A;  . Enteroscopy N/A 05/04/2013    Procedure: ENTEROSCOPY;  Surgeon: Inda Castle, MD;  Location: [facility];  Service: Endoscopy;  Laterality: N/A;  . Bypass graft  1997 ?  Marland Kitchen Coronary artery bypass graft      15-17 yrs  . Joint replacement Right      Hip 10-12 yrs  . Cardiac catheterization    . Endarterectomy Left 06/15/2013    Procedure: ENDARTERECTOMY CAROTID- LEFT;  Surgeon: Conrad Houston, MD;  Location: [facility];  Service: Vascular;  Laterality: Left;  . Patch angioplasty Left 06/15/2013    Procedure: PATCH ANGIOPLASTY using Vascu-Guard Vascular Patch;  Surgeon: Conrad Ely, MD;  Location: [facility]  OR;  Service: Vascular;  Laterality: Left;  . Kidney surgery  Aug. 2015    Chemo-Bladder   . Cataract extraction, bilateral  2015  . Skin cancer excision Right Feb. 2015    Ear  . Carotid endarterectomy Left 06-15-13    cea  . Eye surgery      SOCIAL HISTORY:   History  Substance Use Topics  . Smoking status: Light Tobacco Smoker -- 0.25 packs/day    Types: Cigarettes  . Smokeless tobacco: Never Used     Comment: Started smoking at age 17.  Tobacco info given 01/12/14  . Alcohol Use: 1.0 - 1.5 oz/week    2-3 Standard drinks or equivalent per week     Comment: Beer -haven't drank since Dec 2013    FAMILY HISTORY:   Family History  Problem Relation Age of Onset  . Emphysema Father   . Heart disease Father   . Cancer Mother     unknwn type   . Tuberculosis Paternal Grandfather   . Peripheral vascular disease Sister   . Hyperlipidemia Brother   . Colon cancer Neg Hx     DRUG ALLERGIES:  No Known Allergies  REVIEW OF SYSTEMS:  CONSTITUTIONAL: No fever, fatigue or weakness. Dehydrated.  EYES: No blurred or double vision.  EARS, NOSE, AND THROAT: No tinnitus or ear pain.  RESPIRATORY: No cough, shortness of breath, wheezing or hemoptysis.  CARDIOVASCULAR: No chest pain, orthopnea, edema.  GASTROINTESTINAL: No nausea, vomiting, diarrhea or abdominal pain.  GENITOURINARY: No dysuria, hematuria.  ENDOCRINE: No polyuria, nocturia,  HEMATOLOGY: No anemia, easy bruising or bleeding SKIN: No rash or lesion. MUSCULOSKELETAL: No joint pain or arthritis.   NEUROLOGIC: No tingling, numbness, weakness. Syncope.  PSYCHIATRY: No anxiety or depression.   MEDICATIONS AT HOME:   Prior to Admission medications   Medication Sig Start Date End Date Taking? Authorizing Provider  albuterol (PROVENTIL) (2.5 MG/3ML) 0.083% nebulizer solution Take 2.5 mg by nebulization every 4 (four) hours as needed for shortness of breath.    Yes Historical Provider, MD  allopurinol (ZYLOPRIM) 300 MG tablet Take 300 mg by mouth daily. Gout   Yes Historical Provider, MD  amiodarone (PACERONE) 200 MG tablet Take 100 mg by mouth daily.    Yes Historical Provider, MD  cyclobenzaprine (FLEXERIL) 5 MG tablet Take 5 mg by mouth at bedtime.   Yes Historical Provider, MD  diltiazem (DILACOR XR) 120 MG 24 hr capsule Take 1 capsule (120 mg total) by mouth every morning. 04/19/13  Yes Troy Sine, MD  docusate sodium (COLACE) 100 MG capsule Take 100 mg by mouth daily as needed.    Yes Historical Provider, MD  ferrous sulfate 325 (65 FE) MG tablet Take 325 mg by mouth daily with breakfast.   Yes Historical Provider, MD  furosemide (LASIX) 40 MG tablet Take 40 mg by mouth daily.   Yes Historical Provider, MD  gabapentin (NEURONTIN) 300 MG capsule Take 600 mg by mouth 3  (three) times daily as needed (for sleep).    Yes Historical Provider, MD  naproxen sodium (ANAPROX) 220 MG tablet Take 220 mg by mouth 2 (two) times daily as needed (for arthritis).   Yes Historical Provider, MD  omeprazole (PRILOSEC) 20 MG capsule Take 40 mg by mouth daily.    Yes Historical Provider, MD  spironolactone (ALDACTONE) 50 MG tablet Take 50 mg by mouth daily.   Yes Historical Provider, MD  Tiotropium Bromide-Olodaterol 2.5-2.5 MCG/ACT AERS Inhale 2 puffs into the lungs at  bedtime.   Yes Historical Provider, MD  traMADol (ULTRAM) 50 MG tablet Take 50 mg by mouth every 8 (eight) hours as needed for moderate pain.   Yes Historical Provider, MD  formoterol (FORADIL) 12 MCG capsule for inhaler Place 1 capsule (12 mcg total) into inhaler and inhale 2 (two) times daily as needed for wheezing. Patient not taking: Reported on 04/04/2015 12/05/14   Wellington Hampshire, MD  tiotropium (SPIRIVA) 18 MCG inhalation capsule Place 1 capsule (18 mcg total) into inhaler and inhale daily after supper. Patient not taking: Reported on 04/04/2015 12/05/14   Wellington Hampshire, MD      VITAL SIGNS:  Blood pressure 126/67, pulse 83, temperature 98 F (36.7 C), temperature source Rectal, resp. rate 17, weight 60.782 kg (134 lb), SpO2 97 %.  PHYSICAL EXAMINATION:  GENERAL:  74 y.o.-year-old patient lying in the bed with no acute distress.  EYES: Pupils equal, round, reactive to light and accommodation. No scleral icterus. Extraocular muscles intact.  HEENT: Head atraumatic, normocephalic. Oropharynx and nasopharynx clear. Dry oral mucosa.  NECK:  Supple, no jugular venous distention. No thyroid enlargement, no tenderness.  LUNGS: Normal breath sounds bilaterally,bilateral mild wheezing,  no rales,rhonchi or crepitation. No use of accessory muscles of respiration.  CARDIOVASCULAR: S1, S2 normal. No murmurs, rubs, or gallops.  ABDOMEN: Soft, nontender, nondistended. Bowel sounds present. No organomegaly or mass.   EXTREMITIES: No pedal edema, cyanosis, or clubbing.  NEUROLOGIC: Cranial nerves II through XII are intact. Muscle strength 5/5 in all extremities. Sensation intact. Gait not checked.  PSYCHIATRIC: The patient is alert and oriented x 3.  SKIN: No obvious rash, lesion, or ulcer.   LABORATORY PANEL:   CBC  Recent Labs Lab 04/04/15 1350  WBC 7.4  HGB 10.1*  HCT 30.6*  PLT 135*   ------------------------------------------------------------------------------------------------------------------  Chemistries   Recent Labs Lab 04/04/15 1350  NA 138  K 4.6  CL 102  CO2 28  GLUCOSE 139*  BUN 33*  CREATININE 0.88  CALCIUM 8.7*  AST 33  ALT 27  ALKPHOS 156*  BILITOT 1.1   ------------------------------------------------------------------------------------------------------------------  Cardiac Enzymes  Recent Labs Lab 04/04/15 1350  TROPONINI <0.03   ------------------------------------------------------------------------------------------------------------------  RADIOLOGY:  Ct Head Wo Contrast  04/04/2015   CLINICAL DATA:  Altered mental status.  Unresponsive.  Fever.  EXAM: CT HEAD WITHOUT CONTRAST  TECHNIQUE: Contiguous axial images were obtained from the base of the skull through the vertex without intravenous contrast.  COMPARISON:  MRI head 01/11/2015  FINDINGS: Mild atrophy.  Negative for hydrocephalus  Moderate chronic microvascular ischemic change throughout the cerebral white matter. 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 Chronic infarct right internal capsule anteriorly.  Negative for acute infarct.  Negative for hemorrhage or mass.  Atherosclerotic calcification in the vertebral arteries and carotid arteries.  IMPRESSION: Atrophy and chronic microvascular ischemia.  No acute abnormality.   Electronically Signed   By: Franchot Gallo M.D.   On: 04/04/2015 14:23   Dg Chest Port 1 View  04/04/2015   CLINICAL DATA:  Altered mental status  EXAM: PORTABLE CHEST -  1 VIEW  COMPARISON:  Chest radiograph November 04, 2014; chest CT November 04, 2014  FINDINGS: There is underlying emphysematous change. Interstitium is mildly prominent but stable, reflecting chronic inflammatory type change. In comparison with the previous studies, there is increased opacity at the level of the anterior left first rib. There is an exostosis in this area which may be causing this increased opacity. There is no apparent edema  or consolidation.  Heart size and pulmonary vascularity are normal. No adenopathy. Patient is status post coronary artery bypass grafting. There is an old healed fracture of the right clavicle. No acute fracture identified.  IMPRESSION: In comparison with prior studies, there is increased opacity in the region of the anterior left first rib. This opacity may be due to a combination of portable technique and exostosis in this area. An underlying parenchymal lung lesion, however, cannot be excluded on this study. If patient is clinically able, an apical lordotic chest radiograph could be helpful to exclude a mass in the left apex. If patient cannot cooperate for apical lordotic study, noncontrast enhanced chest CT could also be helpful to further evaluate this area and would be advised.  No edema or consolidation.  Underlying emphysematous change.   Electronically Signed   By: Lowella Grip III M.D.   On: 04/04/2015 14:20    EKG:   Orders placed or performed in visit on 02/15/15  . EKG 12-Lead    IMPRESSION AND PLAN:   Heat exhaustion Dehydration COPD CAD A. fib Hypertension Tobacco abuse  Plan:  The patient will be placed for observation. I will start normal saline IV for rehydration and follow-up BMP. Hold the Lasix and spironolactone. The patient has a mild wheezing possibly due to COPD. I will start nebulizer treatment and continue the patient's home nebulizer. I will continue other patient home medication. Smoking cessation was counseled for 3-4  minutes, I will given nicotine patch.   All the records are reviewed and case discussed with ED provider. Management plans discussed with the patient, the patient wants full code.   CODE STATUS: Full code  TOTAL TIME TAKING CARE OF THIS PATIENT: 57 minutes.    Demetrios Loll M.D on 04/04/2015 at 5:52 PM  Between 7am to 6pm - Pager - [phone]  After 6pm go to [email or web address] - password [email or web address] [facility]  Office  [phone]  CC: Primary care physician; BABAOFF, Caryl Bis, MD

## 2015-04-05 LAB — BASIC METABOLIC PANEL
Anion gap: 4 — ABNORMAL LOW (ref 5–15)
BUN: 24 mg/dL — AB (ref 6–20)
CO2: 27 mmol/L (ref 22–32)
CREATININE: 0.74 mg/dL (ref 0.61–1.24)
Calcium: 8.4 mg/dL — ABNORMAL LOW (ref 8.9–10.3)
Chloride: 106 mmol/L (ref 101–111)
GFR calc Af Amer: >60 mL/min (ref >60)
GFR calc non Af Amer: >60 mL/min (ref >60)
Glucose, Bld: 98 mg/dL (ref 65–99)
POTASSIUM: 4.2 mmol/L (ref 3.5–5.1)
SODIUM: 137 mmol/L (ref 135–145)

## 2015-04-05 MED ORDER — HALOPERIDOL LACTATE 5 MG/ML IJ SOLN
INTRAMUSCULAR | Status: AC
  Filled 2015-04-05: qty 1

## 2015-04-05 NOTE — Care Management (Signed)
Physical therapy evaluation completed. Recommends home with home health. [place] for services. Wife will transport. Shelbie Ammons RN MSN Care Management [phone]

## 2015-04-05 NOTE — Evaluation (Signed)
Physical Therapy Evaluation Patient Details Name: Jason Larsen MRN: [id number] DOB: 1941-06-19 Today's Date: 04/05/2015   History of Present Illness  presented to ER secondary to unresponsive episode; admitted under observation with heat exhaustion, dehydration. Head CT negative for acute change.  Clinical Impression  Upon evaluation, patient alert and oriented to all information.  Strength and ROM grossly WFL throughout all extremities, but rather strange movement quality noted (somewhat writhing; maintains bilat shoulders in very rounded position with upward tilt of scapulae, somewhat hyperextended/IR humerus bilat).  Patient reports movement patterns/quality baseline for patient.  Currently requiring min assist for bed mobility; cga with RW for sit/stand, basic transfers and gait (100').  Slow, but fairly steady, cadence without buckling or LOB.  Vitals stable and WFL, no orthostasis. Would benefit from skilled PT to address above deficits and promote optimal return to PLOF; recommend transition to HHPT upon discharge from acute hospitalization.     Follow Up Recommendations Home health PT    Equipment Recommendations       Recommendations for Other Services       Precautions / Restrictions Precautions Precautions: Fall Restrictions Weight Bearing Restrictions: No      Mobility  Bed Mobility Overal bed mobility: Needs Assistance Bed Mobility: Supine to Sit     Supine to sit: Min assist        Transfers Overall transfer level: Needs assistance Equipment used: Rolling walker (2 wheeled) Transfers: Sit to/from Stand Sit to Stand: Min guard;Supervision         General transfer comment: requires bilat UE support to complete  Ambulation/Gait Ambulation/Gait assistance: Min guard Ambulation Distance (Feet): 100 Feet Assistive device: Rolling walker (2 wheeled)       General Gait Details: 3-point step to gait pattern; forward flexed posture.  Difficulty maintaining  L foot flat; tends to bear weight through forefoot only (with subsequent L hip/knee flex in stance).  Additional distance limited by hip pain, but baseline for patient.  Stairs            Wheelchair Mobility    Modified Rankin (Stroke Patients Only)       Balance Overall balance assessment: Needs assistance Sitting-balance support: No upper extremity supported Sitting balance-Leahy Scale: Good     Standing balance support: No upper extremity supported Standing balance-Leahy Scale: Fair                               Pertinent Vitals/Pain Pain Assessment: No/denies pain    Home Living Family/patient expects to be discharged to:: Private residence Living Arrangements: Spouse/significant other Available Help at Discharge: Family Type of Home: House Home Access: Stairs to enter Entrance Stairs-Rails: None Entrance Stairs-Number of Steps: 1 Home Layout: One level Home Equipment: Walker - 4 wheels      Prior Function Level of Independence: Independent with assistive device(s)         Comments: Mod indep for household mobilization and activities.  Endorses single fall within previous six months.     Hand Dominance        Extremity/Trunk Assessment   Upper Extremity Assessment: Generalized weakness (strength grossly at least 3/5, elevation to shoulder height only.  Maintains scapulae in very forward, rounded position with upward tilt of scapula; hyperextended, mildly IR position of humerus)           Lower Extremity Assessment: Generalized weakness (generally 4/5 throughout, ROM grossly WFL for basic transfers/mobility)  Communication   Communication: No difficulties  Cognition Arousal/Alertness: Awake/alert Behavior During Therapy: WFL for tasks assessed/performed Overall Cognitive Status: Within Functional Limits for tasks assessed                      General Comments      Exercises        Assessment/Plan     PT Assessment Patient needs continued PT services  PT Diagnosis Difficulty walking;Generalized weakness   PT Problem List Decreased strength;Decreased range of motion;Decreased activity tolerance;Decreased balance;Decreased mobility  PT Treatment Interventions DME instruction;Gait training;Stair training;Functional mobility training;Therapeutic activities;Therapeutic exercise;Patient/family education   PT Goals (Current goals can be found in the Care Plan section) Acute Rehab PT Goals Patient Stated Goal: "to get out of here today" PT Goal Formulation: With patient Time For Goal Achievement: 04/12/15 Potential to Achieve Goals: Fair    Frequency Min 2X/week   Barriers to discharge        Co-evaluation               End of Session Equipment Utilized During Treatment: Gait belt Activity Tolerance: Patient tolerated treatment well Patient left: in chair;with call bell/phone within reach;with chair alarm set Nurse Communication: Mobility status    Functional Assessment Tool Used: clincal judgement Functional Limitation: Mobility: Walking and moving around Mobility: Walking and Moving Around Current Status [phone]): At least 20 percent but less than 40 percent impaired, limited or restricted Mobility: Walking and Moving Around Goal Status [phone]): At least 1 percent but less than 20 percent impaired, limited or restricted    Time: 1041-1110 PT Time Calculation (min) (ACUTE ONLY): 29 min   Charges:   PT Evaluation $Initial PT Evaluation Tier I: 1 Procedure     PT G Codes:   PT G-Codes **NOT FOR INPATIENT CLASS** Functional Assessment Tool Used: clincal judgement Functional Limitation: Mobility: Walking and moving around Mobility: Walking and Moving Around Current Status ([id number]): At least 20 percent but less than 40 percent impaired, limited or restricted Mobility: Walking and Moving Around Goal Status [phone]): At least 1 percent but less than 20 percent impaired, limited  or restricted    Eriberto Felch H. Owens Shark, PT, DPT 04/05/2015, 11:38 AM [phone]

## 2015-04-05 NOTE — Discharge Summary (Signed)
[facility] NAME: Jason Larsen    MR#:  [id number]  DATE OF BIRTH:  [birth year]  DATE OF ADMISSION:  04/04/2015 ADMITTING PHYSICIAN: Demetrios Loll, MD  DATE OF DISCHARGE: 04/05/2015  2:22 PM  PRIMARY CARE PHYSICIAN: BABAOFF, MARC E, MD    ADMISSION DIAGNOSIS:  Transient alteration of awareness [R40.4] Hyperthermia [R50.9] Heat exhaustion, initial encounter [T67.5XXA]  DISCHARGE DIAGNOSIS:  Principal Problem:   Heat exhaustion Active Problems:   Dehydration   SECONDARY DIAGNOSIS:   Past Medical History  Diagnosis Date  . Coronary heart disease   . Hyperlipidemia   . HTN (hypertension)   . COPD (chronic obstructive pulmonary disease)   . PVD (peripheral vascular disease)   . Atrial fibrillation     converted to NSR 5/09 on pacerone  . Shortness of breath     on exertion  . Diabetes mellitus without complication   . Arthritis     fingers  . Anemia   . DJD (degenerative joint disease) of hip   . GI bleed     01/2013 while on warfarin with a hemoglobin of 5.8 (INR was 8)  . CHF (congestive heart failure)     EF 50-55%  . Carotid artery occlusion   . Complication of anesthesia   . PONV (postoperative nausea and vomiting)   . Cataracts, bilateral     Hx: of  . Heart murmur   . Pneumonia     Hx: of  . Cancer of kidney   . Skin cancer 12/2013    Rt. ear  . Bladder cancer   . Fall at home Dec. 2015  . Blood transfusion without reported diagnosis     HOSPITAL COURSE:   74 year old male with past medical history of COPD on home oxygen, hypertension, hyperlipidemia, coronary disease, type 2 diabetes without consultation, DJD, chronic anemia, history of GI bleed, history of CHF, history of bladder cancer, who presented to Hospital due to weakness and altered mental status and noted to be Hyperthermic due to heat exhaustion.  #1 Heat Exhaustion - this was likely the cause of patient's weakness and altered mental  status. Patient presented to the hospital with temperature as high as 102.  There was no acute infectious source. Patient had negative chest x-ray and negative urinalysis. - Patient was observed overnight in the hospital his temperature is now back down to baseline and so has his mental status. - This was likely a result the patient being outside in the warm weather for prolonged period of time  #2 altered mental status this was likely secondary to heat exhaustion and since patient's temperature has improved his mental status is not back to baseline.  #3 COPD without acute exacerbation Patient will resume his Spiriva and albuterol nebs - Patient is on oxygen at home as needed  #4 history of CHF clinically patient was not in congestive heart failure on the hospital he will resume his Lasix and Aldactone.  #5 history of chronic atrial fibrillation patient remained rate controlled and will continue his amiodarone and Cardizem. Patient is not on long-term anticoagulation given his high fall risk  #6 GERD patient will continue his Prilosec   #7 gout - there is no acute gout attack patient will continue his allopurinol  DISCHARGE CONDITIONS:   Stable  CONSULTS OBTAINED:     None DRUG ALLERGIES:  No Known Allergies  DISCHARGE MEDICATIONS:   Discharge Medication List as of 04/05/2015 12:57 PM  CONTINUE these medications which have NOT CHANGED   Details  albuterol (PROVENTIL) (2.5 MG/3ML) 0.083% nebulizer solution Take 2.5 mg by nebulization every 4 (four) hours as needed for shortness of breath. , Until Discontinued, Historical Med    allopurinol (ZYLOPRIM) 300 MG tablet Take 300 mg by mouth daily. Gout, Until Discontinued, Historical Med    amiodarone (PACERONE) 200 MG tablet Take 100 mg by mouth daily. , Until Discontinued, Historical Med    cyclobenzaprine (FLEXERIL) 5 MG tablet Take 5 mg by mouth at bedtime., Until Discontinued, Historical Med    diltiazem (DILACOR XR) 120 MG  24 hr capsule Take 1 capsule (120 mg total) by mouth every morning., Starting 04/19/2013, Until Discontinued, Normal    docusate sodium (COLACE) 100 MG capsule Take 100 mg by mouth daily as needed. , Until Discontinued, Historical Med    ferrous sulfate 325 (65 FE) MG tablet Take 325 mg by mouth daily with breakfast., Until Discontinued, Historical Med    formoterol (FORADIL) 12 MCG capsule for inhaler Place 1 capsule (12 mcg total) into inhaler and inhale 2 (two) times daily as needed for wheezing., Starting 12/05/2014, Until Discontinued, Print    furosemide (LASIX) 40 MG tablet Take 40 mg by mouth daily., Until Discontinued, Historical Med    gabapentin (NEURONTIN) 300 MG capsule Take 600 mg by mouth 3 (three) times daily as needed (for sleep). , Until Discontinued, Historical Med    naproxen sodium (ANAPROX) 220 MG tablet Take 220 mg by mouth 2 (two) times daily as needed (for arthritis)., Until Discontinued, Historical Med    omeprazole (PRILOSEC) 20 MG capsule Take 40 mg by mouth daily. , Until Discontinued, Historical Med    spironolactone (ALDACTONE) 50 MG tablet Take 50 mg by mouth daily., Until Discontinued, Historical Med    Tiotropium Bromide-Olodaterol 2.5-2.5 MCG/ACT AERS Inhale 2 puffs into the lungs at bedtime., Until Discontinued, Historical Med    traMADol (ULTRAM) 50 MG tablet Take 50 mg by mouth every 8 (eight) hours as needed for moderate pain., Until Discontinued, Historical Med    tiotropium (SPIRIVA) 18 MCG inhalation capsule Place 1 capsule (18 mcg total) into inhaler and inhale daily after supper., Starting 12/05/2014, Until Discontinued, Print         DISCHARGE INSTRUCTIONS:   DIET:  Cardiac diet  DISCHARGE CONDITION:  Stable  ACTIVITY:  Activity as tolerated  OXYGEN:  Home Oxygen: Yes.     Oxygen Delivery: 2 liters/min via Patient connected to nasal cannula oxygen  DISCHARGE LOCATION:  Home with home health nursing and physical therapy   If you  experience worsening of your admission symptoms, develop shortness of breath, life threatening emergency, suicidal or homicidal thoughts you must seek medical attention immediately by calling 911 or calling your MD immediately  if symptoms less severe.  You Must read complete instructions/literature along with all the possible adverse reactions/side effects for all the Medicines you take and that have been prescribed to you. Take any new Medicines after you have completely understood and accpet all the possible adverse reactions/side effects.   Please note  You were cared for by a hospitalist during your hospital stay. If you have any questions about your discharge medications or the care you received while you were in the hospital after you are discharged, you can call the unit and asked to speak with the hospitalist on call if the hospitalist that took care of you is not available. Once you are discharged, your primary care physician will handle any  further medical issues. Please note that NO REFILLS for any discharge medications will be authorized once you are discharged, as it is imperative that you return to your primary care physician (or establish a relationship with a primary care physician if you do not have one) for your aftercare needs so that they can reassess your need for medications and monitor your lab values.     Today    VITAL SIGNS:  Blood pressure 116/92, pulse 90, temperature 98 F (36.7 C), temperature source Oral, resp. rate 20, height 5\' 4"  (1.626 m), weight 60.782 kg (134 lb), SpO2 100 %.  I/O:   Intake/Output Summary (Last 24 hours) at 04/05/15 1452 Last data filed at 04/05/15 1300  Gross per 24 hour  Intake    480 ml  Output    350 ml  Net    130 ml    PHYSICAL EXAMINATION:  GENERAL:  74 y.o.-year-old patient lying in the bed with no acute distress.  EYES: Pupils equal, round, reactive to light and accommodation. No scleral icterus. Extraocular muscles intact.   HEENT: Head atraumatic, normocephalic. Oropharynx and nasopharynx clear.  NECK:  Supple, no jugular venous distention. No thyroid enlargement, no tenderness.  LUNGS: good a/e b/l.  Minimal end-exp. Wheezing. rales, rhonchi. No use of accessory muscles of respiration.  CARDIOVASCULAR: S1, S2 normal. No murmurs, rubs, or gallops.  ABDOMEN: Soft, non-tender, non-distended. Bowel sounds present. No organomegaly or mass.  EXTREMITIES: No pedal edema, cyanosis, or clubbing.  NEUROLOGIC: Cranial nerves II through XII are intact. No focal motor or sensory defecits b/l.  PSYCHIATRIC: The patient is alert and oriented x 3. Good affect.  SKIN: No obvious rash, lesion, or ulcer.   DATA REVIEW:   CBC  Recent Labs Lab 04/04/15 1350  WBC 7.4  HGB 10.1*  HCT 30.6*  PLT 135*    Chemistries   Recent Labs Lab 04/04/15 1350 04/05/15 0442  NA 138 137  K 4.6 4.2  CL 102 106  CO2 28 27  GLUCOSE 139* 98  BUN 33* 24*  CREATININE 0.88 0.74  CALCIUM 8.7* 8.4*  AST 33  --   ALT 27  --   ALKPHOS 156*  --   BILITOT 1.1  --     Cardiac Enzymes  Recent Labs Lab 04/04/15 1350  TROPONINI <0.03    Microbiology Results  Results for orders placed or performed during the hospital encounter of 04/04/15  Blood Culture (routine x 2)     Status: None (Preliminary result)   Collection Time: 04/04/15  1:44 PM  Result Value Ref Range Status   Specimen Description BLOOD  Final   Special Requests NONE  Final   Culture NO GROWTH < 24 HOURS  Final   Report Status PENDING  Incomplete  Urine culture     Status: None (Preliminary result)   Collection Time: 04/04/15  1:44 PM  Result Value Ref Range Status   Specimen Description URINE, RANDOM  Final   Special Requests NONE  Final   Culture NO GROWTH < 24 HOURS  Final   Report Status PENDING  Incomplete  Blood Culture (routine x 2)     Status: None (Preliminary result)   Collection Time: 04/04/15  1:49 PM  Result Value Ref Range Status   Specimen  Description BLOOD  Final   Special Requests NONE  Final   Culture NO GROWTH < 24 HOURS  Final   Report Status PENDING  Incomplete    RADIOLOGY:  Ct Head Wo  Contrast  04/04/2015   CLINICAL DATA:  Altered mental status.  Unresponsive.  Fever.  EXAM: CT HEAD WITHOUT CONTRAST  TECHNIQUE: Contiguous axial images were obtained from the base of the skull through the vertex without intravenous contrast.  COMPARISON:  MRI head 01/11/2015  FINDINGS: Mild atrophy.  Negative for hydrocephalus  Moderate chronic microvascular ischemic change throughout the cerebral white matter. 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 Chronic infarct right internal capsule anteriorly.  Negative for acute infarct.  Negative for hemorrhage or mass.  Atherosclerotic calcification in the vertebral arteries and carotid arteries.  IMPRESSION: Atrophy and chronic microvascular ischemia.  No acute abnormality.   Electronically Signed   By: Franchot Gallo M.D.   On: 04/04/2015 14:23   Dg Chest Port 1 View  04/04/2015   CLINICAL DATA:  Altered mental status  EXAM: PORTABLE CHEST - 1 VIEW  COMPARISON:  Chest radiograph November 04, 2014; chest CT November 04, 2014  FINDINGS: There is underlying emphysematous change. Interstitium is mildly prominent but stable, reflecting chronic inflammatory type change. In comparison with the previous studies, there is increased opacity at the level of the anterior left first rib. There is an exostosis in this area which may be causing this increased opacity. There is no apparent edema or consolidation.  Heart size and pulmonary vascularity are normal. No adenopathy. Patient is status post coronary artery bypass grafting. There is an old healed fracture of the right clavicle. No acute fracture identified.  IMPRESSION: In comparison with prior studies, there is increased opacity in the region of the anterior left first rib. This opacity may be due to a combination of portable technique and exostosis in this  area. An underlying parenchymal lung lesion, however, cannot be excluded on this study. If patient is clinically able, an apical lordotic chest radiograph could be helpful to exclude a mass in the left apex. If patient cannot cooperate for apical lordotic study, noncontrast enhanced chest CT could also be helpful to further evaluate this area and would be advised.  No edema or consolidation.  Underlying emphysematous change.   Electronically Signed   By: Lowella Grip III M.D.   On: 04/04/2015 14:20      Management plans discussed with the patient, family and they are in agreement.  CODE STATUS:     Code Status Orders        Start     Ordered   04/04/15 1705  Full code   Continuous     04/04/15 1704      TOTAL TIME TAKING CARE OF THIS PATIENT: 35 minutes.    Henreitta Leber M.D on 04/05/2015 at 2:52 PM  Between 7am to 6pm - Pager - [phone]  After 6pm go to [email or web address] - password [email or web address] [facility]  Office  [phone]  CC: Primary care physician; BABAOFF, Caryl Bis, MD

## 2015-04-05 NOTE — Care Management (Signed)
Admitted to this facility with the diagnosis of heat exhaustion. Lives with wife, Marcie Bal. States he uses his home oxygen as needed. No home health. No skilled facility. Uses a rolling walker to aide in ambulation. Sees Dr. Rogelia Mire. Last seen 4 weeks ago. Will be discharged today per Dr. Heron Sabins. Physical therapy evaluation pending. [facility] [phone]

## 2015-04-05 NOTE — Discharge Instructions (Signed)
°  DIET:  Cardiac diet  DISCHARGE CONDITION:  Stable  ACTIVITY:  Activity as tolerated  OXYGEN:  Home Oxygen: Yes   Oxygen Delivery: 2 L [place]% O2 via Patient connected to nasal cannula oxygen  DISCHARGE LOCATION:  home   If you experience worsening of your admission symptoms, develop shortness of breath, life threatening emergency, suicidal or homicidal thoughts you must seek medical attention immediately by calling 911 or calling your MD immediately  if symptoms less severe.  You Must read complete instructions/literature along with all the possible adverse reactions/side effects for all the Medicines you take and that have been prescribed to you. Take any new Medicines after you have completely understood and accpet all the possible adverse reactions/side effects.   Please note  You were cared for by a hospitalist during your hospital stay. If you have any questions about your discharge medications or the care you received while you were in the hospital after you are discharged, you can call the unit and asked to speak with the hospitalist on call if the hospitalist that took care of you is not available. Once you are discharged, your primary care physician will handle any further medical issues. Please note that NO REFILLS for any discharge medications will be authorized once you are discharged, as it is imperative that you return to your primary care physician (or establish a relationship with a primary care physician if you do not have one) for your aftercare needs so that they can reassess your need for medications and monitor your lab values.

## 2015-04-05 NOTE — Plan of Care (Signed)
Problem: Discharge Progression Outcomes Goal: Discharge plan in place and appropriate Individualization Pt goes by Jason Larsen, high fall risk, has a hx of    Coronary heart disease, Hyperlipidemia, HTN (hypertension), COPD (chronic obstructive pulmonary disease), PVD (peripheral vascular disease), Atrial fibrillation ,Shortness of breath on exertion, Diabetes mellitus without complication, Arthritis, Anemia, DJD (degenerative joint disease) of hip, GI bleed 01/2013 while on warfarin  ,CHF (congestive heart failure) EF 50-55%, Carotid artery occlusion,Complication of anesthesia PONV postoperative nausea and vomiting), Cataracts bilateral, Heart murmur, Pneumonia, Cancer of kidney, Skin cancer 12/2013 Rt. Ear,Bladder cancer, Fall at home. Pt does take home medications.         Goal: Other Discharge Outcomes/Goals Outcome: Completed/Met Date Met:  04/05/15 Pt complains of no pain, foley in place draining clear, yellow urine. IV Fluids infusing, will continue to monitor     

## 2015-04-05 NOTE — Plan of Care (Signed)
Problem: Discharge Progression Outcomes Goal: Other Discharge Outcomes/Goals Outcome: Progressing Patient discharge home per MD order. All discharge instructions given and all questions answered.

## 2015-04-06 LAB — URINE CULTURE: CULTURE: NO GROWTH

## 2015-04-09 LAB — CULTURE, BLOOD (ROUTINE X 2)
CULTURE: NO GROWTH
Culture: NO GROWTH

## 2015-05-03 ENCOUNTER — Encounter: Payer: Self-pay | Admitting: *Deleted

## 2015-05-03 ENCOUNTER — Encounter: Payer: Self-pay | Admitting: Gastroenterology

## 2015-05-22 ENCOUNTER — Other Ambulatory Visit: Payer: Self-pay | Admitting: Physical Medicine and Rehabilitation

## 2015-05-22 DIAGNOSIS — M5416 Radiculopathy, lumbar region: Secondary | ICD-10-CM

## 2015-05-28 ENCOUNTER — Ambulatory Visit
Admission: RE | Admit: 2015-05-28 | Discharge: 2015-05-28 | Disposition: A | Discharge status: Home / Self Care | Payer: Commercial Managed Care - HMO | Source: Ambulatory Visit | Attending: Physical Medicine and Rehabilitation | Admitting: Physical Medicine and Rehabilitation

## 2015-05-28 DIAGNOSIS — M4856XA Collapsed vertebra, not elsewhere classified, lumbar region, initial encounter for fracture: Secondary | ICD-10-CM | POA: Diagnosis not present

## 2015-05-28 DIAGNOSIS — M5416 Radiculopathy, lumbar region: Secondary | ICD-10-CM

## 2015-05-28 DIAGNOSIS — M5136 Other intervertebral disc degeneration, lumbar region: Secondary | ICD-10-CM | POA: Diagnosis not present

## 2015-05-28 DIAGNOSIS — M47896 Other spondylosis, lumbar region: Secondary | ICD-10-CM | POA: Diagnosis not present

## 2015-05-28 DIAGNOSIS — M79605 Pain in left leg: Secondary | ICD-10-CM | POA: Diagnosis present

## 2015-05-28 DIAGNOSIS — M79604 Pain in right leg: Secondary | ICD-10-CM | POA: Diagnosis present

## 2015-05-28 DIAGNOSIS — M545 Low back pain: Secondary | ICD-10-CM | POA: Diagnosis present

## 2015-05-28 DIAGNOSIS — M5386 Other specified dorsopathies, lumbar region: Secondary | ICD-10-CM | POA: Insufficient documentation

## 2015-05-30 ENCOUNTER — Encounter: Payer: Self-pay | Admitting: Cardiovascular Disease

## 2015-06-18 ENCOUNTER — Ambulatory Visit: Payer: Commercial Managed Care - HMO | Admitting: Cardiovascular Disease

## 2015-06-22 ENCOUNTER — Encounter: Payer: Self-pay | Admitting: *Deleted

## 2015-06-22 ENCOUNTER — Observation Stay
Admission: EM | Admit: 2015-06-22 | Discharge: 2015-06-23 | Disposition: A | Discharge status: Home / Self Care | Payer: Commercial Managed Care - HMO | Attending: Internal Medicine | Admitting: Internal Medicine

## 2015-06-22 ENCOUNTER — Emergency Department: Payer: Commercial Managed Care - HMO

## 2015-06-22 DIAGNOSIS — T670XXA Heatstroke and sunstroke, initial encounter: Secondary | ICD-10-CM | POA: Diagnosis present

## 2015-06-22 DIAGNOSIS — S2241XA Multiple fractures of ribs, right side, initial encounter for closed fracture: Secondary | ICD-10-CM | POA: Insufficient documentation

## 2015-06-22 DIAGNOSIS — Z8489 Family history of other specified conditions: Secondary | ICD-10-CM | POA: Diagnosis not present

## 2015-06-22 DIAGNOSIS — X58XXXA Exposure to other specified factors, initial encounter: Secondary | ICD-10-CM | POA: Insufficient documentation

## 2015-06-22 DIAGNOSIS — I739 Peripheral vascular disease, unspecified: Secondary | ICD-10-CM | POA: Insufficient documentation

## 2015-06-22 DIAGNOSIS — J441 Chronic obstructive pulmonary disease with (acute) exacerbation: Secondary | ICD-10-CM | POA: Insufficient documentation

## 2015-06-22 DIAGNOSIS — I10 Essential (primary) hypertension: Secondary | ICD-10-CM | POA: Diagnosis not present

## 2015-06-22 DIAGNOSIS — Z8551 Personal history of malignant neoplasm of bladder: Secondary | ICD-10-CM | POA: Insufficient documentation

## 2015-06-22 DIAGNOSIS — M19049 Primary osteoarthritis, unspecified hand: Secondary | ICD-10-CM | POA: Insufficient documentation

## 2015-06-22 DIAGNOSIS — Z9660 Presence of unspecified orthopedic joint implant: Secondary | ICD-10-CM | POA: Insufficient documentation

## 2015-06-22 DIAGNOSIS — I2510 Atherosclerotic heart disease of native coronary artery without angina pectoris: Secondary | ICD-10-CM | POA: Insufficient documentation

## 2015-06-22 DIAGNOSIS — Z809 Family history of malignant neoplasm, unspecified: Secondary | ICD-10-CM | POA: Insufficient documentation

## 2015-06-22 DIAGNOSIS — E785 Hyperlipidemia, unspecified: Secondary | ICD-10-CM | POA: Insufficient documentation

## 2015-06-22 DIAGNOSIS — K7460 Unspecified cirrhosis of liver: Secondary | ICD-10-CM | POA: Insufficient documentation

## 2015-06-22 DIAGNOSIS — Z951 Presence of aortocoronary bypass graft: Secondary | ICD-10-CM | POA: Insufficient documentation

## 2015-06-22 DIAGNOSIS — T6701XA Heatstroke and sunstroke, initial encounter: Secondary | ICD-10-CM

## 2015-06-22 DIAGNOSIS — Z825 Family history of asthma and other chronic lower respiratory diseases: Secondary | ICD-10-CM | POA: Diagnosis not present

## 2015-06-22 DIAGNOSIS — Z85828 Personal history of other malignant neoplasm of skin: Secondary | ICD-10-CM | POA: Diagnosis not present

## 2015-06-22 DIAGNOSIS — Z9862 Peripheral vascular angioplasty status: Secondary | ICD-10-CM | POA: Diagnosis not present

## 2015-06-22 DIAGNOSIS — I4891 Unspecified atrial fibrillation: Secondary | ICD-10-CM | POA: Insufficient documentation

## 2015-06-22 DIAGNOSIS — E119 Type 2 diabetes mellitus without complications: Secondary | ICD-10-CM | POA: Diagnosis not present

## 2015-06-22 DIAGNOSIS — Z9889 Other specified postprocedural states: Secondary | ICD-10-CM | POA: Diagnosis not present

## 2015-06-22 DIAGNOSIS — F17210 Nicotine dependence, cigarettes, uncomplicated: Secondary | ICD-10-CM | POA: Diagnosis not present

## 2015-06-22 DIAGNOSIS — Z87442 Personal history of urinary calculi: Secondary | ICD-10-CM | POA: Diagnosis not present

## 2015-06-22 DIAGNOSIS — R918 Other nonspecific abnormal finding of lung field: Secondary | ICD-10-CM | POA: Diagnosis not present

## 2015-06-22 DIAGNOSIS — Z66 Do not resuscitate: Secondary | ICD-10-CM | POA: Diagnosis not present

## 2015-06-22 DIAGNOSIS — R55 Syncope and collapse: Secondary | ICD-10-CM | POA: Diagnosis present

## 2015-06-22 DIAGNOSIS — I509 Heart failure, unspecified: Secondary | ICD-10-CM | POA: Insufficient documentation

## 2015-06-22 LAB — CBC WITH DIFFERENTIAL/PLATELET
Basophils Absolute: 0 10*3/uL (ref 0–0.1)
Basophils Relative: 0 %
Eosinophils Absolute: 0 10*3/uL (ref 0–0.7)
Eosinophils Relative: 0 %
HCT: 33.3 % — ABNORMAL LOW (ref 40.0–52.0)
Hemoglobin: 11.2 g/dL — ABNORMAL LOW (ref 13.0–18.0)
Lymphocytes Relative: 7 %
Lymphs Abs: 0.4 10*3/uL — ABNORMAL LOW (ref 1.0–3.6)
MCH: 32.5 pg (ref 26.0–34.0)
MCHC: 33.5 g/dL (ref 32.0–36.0)
MCV: 96.9 fL (ref 80.0–100.0)
Monocytes Absolute: 0.5 10*3/uL (ref 0.2–1.0)
Monocytes Relative: 8 %
Neutro Abs: 5.2 10*3/uL (ref 1.4–6.5)
Neutrophils Relative %: 85 %
Platelets: 101 10*3/uL — ABNORMAL LOW (ref 150–440)
RBC: 3.44 MIL/uL — ABNORMAL LOW (ref 4.40–5.90)
RDW: 15.6 % — ABNORMAL HIGH (ref 11.5–14.5)
WBC: 6.2 10*3/uL (ref 3.8–10.6)

## 2015-06-22 LAB — CK: CK TOTAL: 53 U/L (ref 49–397)

## 2015-06-22 LAB — URINALYSIS COMPLETE WITH MICROSCOPIC (ARMC ONLY)
Bacteria, UA: NONE SEEN
Bilirubin Urine: NEGATIVE
Glucose, UA: NEGATIVE mg/dL
Hgb urine dipstick: NEGATIVE
Ketones, ur: NEGATIVE mg/dL
Leukocytes, UA: NEGATIVE
Nitrite: NEGATIVE
Protein, ur: NEGATIVE mg/dL
Specific Gravity, Urine: 1.012 (ref 1.005–1.030)
Squamous Epithelial / HPF: NONE SEEN
pH: 7 (ref 5.0–8.0)

## 2015-06-22 LAB — COMPREHENSIVE METABOLIC PANEL
ALT: 36 U/L (ref 17–63)
AST: 43 U/L — ABNORMAL HIGH (ref 15–41)
Albumin: 3.2 g/dL — ABNORMAL LOW (ref 3.5–5.0)
Alkaline Phosphatase: 114 U/L (ref 38–126)
Anion gap: 7 (ref 5–15)
BUN: 22 mg/dL — ABNORMAL HIGH (ref 6–20)
CO2: 24 mmol/L (ref 22–32)
Calcium: 8.6 mg/dL — ABNORMAL LOW (ref 8.9–10.3)
Chloride: 107 mmol/L (ref 101–111)
Creatinine, Ser: 0.94 mg/dL (ref 0.61–1.24)
GFR calc Af Amer: >60 mL/min (ref >60)
GFR calc non Af Amer: >60 mL/min (ref >60)
Glucose, Bld: 113 mg/dL — ABNORMAL HIGH (ref 65–99)
Potassium: 3.9 mmol/L (ref 3.5–5.1)
Sodium: 138 mmol/L (ref 135–145)
Total Bilirubin: 1.3 mg/dL — ABNORMAL HIGH (ref 0.3–1.2)
Total Protein: 6.2 g/dL — ABNORMAL LOW (ref 6.5–8.1)

## 2015-06-22 LAB — TROPONIN I

## 2015-06-22 LAB — CKMB (ARMC ONLY): CK, MB: 1.3 ng/mL (ref 0.5–5.0)

## 2015-06-22 MED ORDER — AZITHROMYCIN 250 MG PO TABS
500.0000 mg | ORAL_TABLET | Freq: Every day | ORAL | Status: AC
  Administered 2015-06-22: 500 mg via ORAL
  Filled 2015-06-22: qty 2

## 2015-06-22 MED ORDER — SODIUM CHLORIDE 0.9 % IJ SOLN
3.0000 mL | Freq: Two times a day (BID) | INTRAMUSCULAR | Status: DC
  Administered 2015-06-22: 3 mL via INTRAVENOUS

## 2015-06-22 MED ORDER — ACETAMINOPHEN 650 MG RE SUPP: 650.0000 mg | Freq: Four times a day (QID) | RECTAL | Status: DC | PRN

## 2015-06-22 MED ORDER — ACETAMINOPHEN 325 MG PO TABS: 650.0000 mg | ORAL_TABLET | Freq: Four times a day (QID) | ORAL | Status: DC | PRN

## 2015-06-22 MED ORDER — ENOXAPARIN SODIUM 40 MG/0.4ML ~~LOC~~ SOLN
40.0000 mg | SUBCUTANEOUS | Status: DC
  Administered 2015-06-22: 40 mg via SUBCUTANEOUS
  Filled 2015-06-22: qty 0.4

## 2015-06-22 MED ORDER — METHYLPREDNISOLONE SODIUM SUCC 40 MG IJ SOLR
40.0000 mg | Freq: Three times a day (TID) | INTRAMUSCULAR | Status: DC
  Administered 2015-06-22 – 2015-06-23 (×2): 40 mg via INTRAVENOUS
  Filled 2015-06-22 (×2): qty 1

## 2015-06-22 MED ORDER — SODIUM CHLORIDE 0.9 % IV BOLUS (SEPSIS)
1000.0000 mL | Freq: Once | INTRAVENOUS | Status: AC
  Administered 2015-06-22: 1000 mL via INTRAVENOUS

## 2015-06-22 MED ORDER — SODIUM CHLORIDE 0.9 % IV SOLN
INTRAVENOUS | Status: DC
  Administered 2015-06-22 – 2015-06-23 (×4): via INTRAVENOUS

## 2015-06-22 MED ORDER — AZITHROMYCIN 250 MG PO TABS
250.0000 mg | ORAL_TABLET | Freq: Every day | ORAL | Status: DC
  Administered 2015-06-23: 250 mg via ORAL
  Filled 2015-06-22: qty 1

## 2015-06-22 NOTE — H&P (Signed)
[facility] [place] NAME: Jason Larsen    MR#:  [id number]  DATE OF BIRTH:  [birth year]  DATE OF ADMISSION:  06/22/2015  PRIMARY CARE PHYSICIAN: BABAOFF, Caryl Bis, MD   REQUESTING/REFERRING PHYSICIAN: Sydnee Cabal  CHIEF COMPLAINT:   Chief Complaint  Patient presents with  . Fall    HISTORY OF PRESENT ILLNESS:  Jason Larsen  is a 74 y.o. male with a known history of COPD, cirrhosis, bladder cancer, kidney stones. He was in the yard and he got too hot and ended up on the ground. The patient states that he was out in the yard with his walker. His wife couldn't even see him because his yard is so large and thinks he was out there for few hours since his leg and back is sunburned. His wife saw his arm up in the air and called 911 and went out to see him. He couldn't get up at the time. He feels cold at this point. And dizzy earlier.  PAST MEDICAL HISTORY:   Past Medical History  Diagnosis Date  . Coronary heart disease   . Hyperlipidemia   . HTN (hypertension)   . COPD (chronic obstructive pulmonary disease)   . PVD (peripheral vascular disease)   . Atrial fibrillation     converted to NSR 5/09 on pacerone  . Shortness of breath     on exertion  . Diabetes mellitus without complication   . Arthritis     fingers  . Anemia   . DJD (degenerative joint disease) of hip   . GI bleed     01/2013 while on warfarin with a hemoglobin of 5.8 (INR was 8)  . CHF (congestive heart failure)     EF 50-55%  . Carotid artery occlusion   . Complication of anesthesia   . PONV (postoperative nausea and vomiting)   . Cataracts, bilateral     Hx: of  . Heart murmur   . Pneumonia     Hx: of  . Cancer of kidney   . Skin cancer 12/2013    Rt. ear  . Bladder cancer   . Fall at home Dec. 2015  . Blood transfusion without reported diagnosis     PAST SURGICAL HISTORY:   Past Surgical History  Procedure Laterality Date  . Tonsillectomy     as child  . Colonoscopy N/A 02/23/2013    Procedure: COLONOSCOPY;  Surgeon: Inda Castle, MD;  Location: [facility];  Service: Endoscopy;  Laterality: N/A;  . Esophagogastroduodenoscopy N/A 02/23/2013    Procedure: ESOPHAGOGASTRODUODENOSCOPY (EGD);  Surgeon: Inda Castle, MD;  Location: [facility];  Service: Endoscopy;  Laterality: N/A;  . Hot hemostasis N/A 05/04/2013    Procedure: HOT HEMOSTASIS (ARGON PLASMA COAGULATION/BICAP);  Surgeon: Inda Castle, MD;  Location: [facility];  Service: Endoscopy;  Laterality: N/A;  . Enteroscopy N/A 05/04/2013    Procedure: ENTEROSCOPY;  Surgeon: Inda Castle, MD;  Location: [facility];  Service: Endoscopy;  Laterality: N/A;  . Bypass graft  1997 ?  Marland Kitchen Coronary artery bypass graft      15-17 yrs  . Joint replacement Right      Hip 10-12 yrs  . Endarterectomy Left 06/15/2013    Procedure: ENDARTERECTOMY CAROTID- LEFT;  Surgeon: Conrad Kilbourne, MD;  Location: [facility];  Service: Vascular;  Laterality: Left;  . Patch angioplasty Left 06/15/2013    Procedure: PATCH ANGIOPLASTY using Vascu-Guard Vascular Patch;  Surgeon: Aaron Edelman  Starlyn Skeans, MD;  Location: [facility];  Service: Vascular;  Laterality: Left;  . Kidney surgery  Aug. 2015    Chemo-Bladder   . Cataract extraction, bilateral  2015  . Skin cancer excision Right Feb. 2015    Ear  . Carotid endarterectomy Left 06-15-13    cea  . Eye surgery    . Polypectomy  2014  . Nm myocar multiple w/spect  2013    MILD PERFUSION DEFECT SEEN IN THE MID INFERIOR AND APICAL INFERIOR REGIONS. THIS IS CONSISTANT WITH AN INFARCT/SCAR AND OVERLYING ATTENUATION.   . Cardiac catheterization  2009    EF 45%. 3+ MR.MODERATE PULMONARY HTN WITH PREASSURE AT 15MM. SUBTOTAL OCCLUSION OF THE LEFT MAIN, TOTAL PROXIMAL LAD OCCLUSION, TOTAL PROXIMAL CIR OCC. RCA WITH 95% FOLLOWED BY 99% STENOSIS. PATENT LIMA TO LAD. PATENT GRAFT TO OM1 AND OM2. PATENT GRAFT TO PDA. 60% LEFT RENAL ARTERY STENOSIS.  Marland Kitchen Renal duplex  2014    RRA- NORMAL  PATENCY. L MAIN RA- <= 60% DIAMETER REDUCTION. L ACCESSORY RA- NORMAL PATENCY. [place].    SOCIAL HISTORY:   Social History  Substance Use Topics  . Smoking status: Current Every Day Smoker -- 0.25 packs/day    Types: Cigarettes  . Smokeless tobacco: Never Used     Comment: Started smoking at age 8.  Tobacco info given 01/12/14  . Alcohol Use: 1.0 - 1.5 oz/week    2-3 Standard drinks or equivalent per week     Comment: Beer -haven't drank since Dec 2013    FAMILY HISTORY:   Family History  Problem Relation Age of Onset  . Emphysema Father   . Heart disease Father   . Cancer Mother     unknwn type  . Tuberculosis Paternal Grandfather   . Peripheral vascular disease Sister   . Hyperlipidemia Brother   . Colon cancer Neg Hx     DRUG ALLERGIES:  No Known Allergies  REVIEW OF SYSTEMS:  CONSTITUTIONAL: Positive for fever and chills, positive for fatigue. Positive for 50 pound weight loss over a year. EYES: No blurred or double vision. Wears glasses. EARS, NOSE, AND THROAT: No tinnitus or ear pain. No sore throat. Decreased hearing RESPIRATORY: No cough, occasional shortness of breath, occasional wheezing, no hemoptysis.  CARDIOVASCULAR: No chest pain, orthopnea, edema.  GASTROINTESTINAL: No nausea, vomiting, diarrhea or abdominal pain. No blood in bowel movements GENITOURINARY: No dysuria, hematuria.  ENDOCRINE: No polyuria, nocturia,  HEMATOLOGY: No anemia, easy bruising or bleeding SKIN: No rash or lesion. MUSCULOSKELETAL: No joint pain or arthritis.   NEUROLOGIC: No tingling, numbness, weakness.  PSYCHIATRY: No anxiety or depression.   MEDICATIONS AT HOME:   Prior to Admission medications   Medication Sig Start Date End Date Taking? Authorizing Provider  albuterol (PROVENTIL) (2.5 MG/3ML) 0.083% nebulizer solution Take 2.5 mg by nebulization every 4 (four) hours as needed for shortness of breath.    Yes Historical Provider, MD  allopurinol (ZYLOPRIM) 300  MG tablet Take 300 mg by mouth daily.    Yes Historical Provider, MD  amiodarone (PACERONE) 200 MG tablet Take 100 mg by mouth daily.    Yes Historical Provider, MD  atorvastatin (LIPITOR) 10 MG tablet Take 10 mg by mouth daily.   Yes Historical Provider, MD  cyclobenzaprine (FLEXERIL) 5 MG tablet Take 5 mg by mouth at bedtime.   Yes Historical Provider, MD  diltiazem (DILACOR XR) 120 MG 24 hr capsule Take 1 capsule (120 mg total) by mouth every morning.  04/19/13  Yes Troy Sine, MD  docusate sodium (COLACE) 100 MG capsule Take 100 mg by mouth 2 (two) times daily as needed for mild constipation.    Yes Historical Provider, MD  ferrous sulfate 325 (65 FE) MG tablet Take 325 mg by mouth daily.    Yes Historical Provider, MD  furosemide (LASIX) 40 MG tablet Take 40 mg by mouth daily.   Yes Historical Provider, MD  gabapentin (NEURONTIN) 300 MG capsule Take 300 mg by mouth at bedtime.    Yes Historical Provider, MD  naproxen sodium (ANAPROX) 220 MG tablet Take 220 mg by mouth 2 (two) times daily as needed (for pain).    Yes Historical Provider, MD  spironolactone (ALDACTONE) 50 MG tablet Take 50 mg by mouth daily.   Yes Historical Provider, MD  traMADol (ULTRAM) 50 MG tablet Take 50-100 mg by mouth at bedtime.    Yes Historical Provider, MD  formoterol (FORADIL) 12 MCG capsule for inhaler Place 1 capsule (12 mcg total) into inhaler and inhale 2 (two) times daily as needed for wheezing. Patient not taking: Reported on 06/22/2015 12/05/14   Wellington Hampshire, MD  tiotropium (SPIRIVA) 18 MCG inhalation capsule Place 1 capsule (18 mcg total) into inhaler and inhale daily after supper. Patient not taking: Reported on 04/04/2015 12/05/14   Wellington Hampshire, MD      VITAL SIGNS:  Blood pressure 113/65, pulse 81, temperature 99.5 F (37.5 C), temperature source Oral, resp. rate 27, height 5\' 8"  (1.727 m), weight 61.1 kg (134 lb 11.2 oz), SpO2 99 %.  PHYSICAL EXAMINATION:  GENERAL:  74 y.o.-year-old patient  lying in the bed with no acute distress.  EYES: Pupils equal, round, reactive to light and accommodation. No scleral icterus. Extraocular muscles intact.  HEENT: Head atraumatic, normocephalic. Oropharynx and nasopharynx clear.  NECK:  Supple, no jugular venous distention. No thyroid enlargement, no tenderness.  LUNGS: Decreased breath sounds bilaterally, positive wheezing throughout entire lung field, no rales,rhonchi or crepitation. No use of accessory muscles of respiration.  CARDIOVASCULAR: S1, S2 normal. No murmurs, rubs, or gallops.  ABDOMEN: Soft, nontender, nondistended. Bowel sounds present. No organomegaly or mass.  EXTREMITIES: No pedal edema, cyanosis, or clubbing.  NEUROLOGIC: Cranial nerves II through XII are intact. Muscle strength 5/5 in all extremities. Sensation intact. Gait not checked.  PSYCHIATRIC: The patient is alert and oriented x 3.  SKIN: Sunburn back and left leg, nose with some swelling and chronic changes and also looks like some growths.  LABORATORY PANEL:   CBC  Recent Labs Lab 06/22/15 1809  WBC 6.2  HGB 11.2*  HCT 33.3*  PLT 101*   Chemistries   Recent Labs Lab 06/22/15 1809  NA 138  K 3.9  CL 107  CO2 24  GLUCOSE 113*  BUN 22*  CREATININE 0.94  CALCIUM 8.6*  AST 43*  ALT 36  ALKPHOS 114  BILITOT 1.3*    Cardiac Enzymes  Recent Labs Lab 06/22/15 1809  TROPONINI <0.03    RADIOLOGY:  Dg Chest 2 View  06/22/2015   CLINICAL DATA:  Syncope.  EXAM: CHEST  2 VIEW  COMPARISON:  Apr 04, 2015.  FINDINGS: The heart size and mediastinal contours are within normal limits. Status post coronary artery bypass graft. Old right rib fractures are noted. No pneumothorax or pleural effusion is noted. Mild right basilar opacity is noted most consistent with subsegmental atelectasis. Left lung is clear.  IMPRESSION: Mild right basilar subsegmental atelectasis.   Electronically Signed  By: Marijo Conception, M.D.   On: 06/22/2015 17:56   Ct Head Wo  Contrast  06/22/2015   CLINICAL DATA:  Unwitnessed fall  EXAM: CT HEAD WITHOUT CONTRAST  TECHNIQUE: Contiguous axial images were obtained from the base of the skull through the vertex without intravenous contrast.  COMPARISON:  04/04/2015  FINDINGS: No evidence of parenchymal hemorrhage or extra-axial fluid collection. No mass lesion, mass effect, or midline shift.  No CT evidence of acute infarction. Bilateral basal ganglia lacunar infarcts.  Subcortical white matter and periventricular small vessel ischemic changes. Intracranial atherosclerosis.  Partial opacification of the left ethmoid sinus. Mastoid air cells are clear.  Cutaneous thickening/irregularity along the anterior nose (series 2/image 3).  No evidence of calvarial fracture.  IMPRESSION: No evidence of acute intracranial abnormality.  Bilateral basal ganglia lacunar infarcts. Atrophy with small vessel ischemic changes.  Cutaneous thickening/irregularity along the anterior nose. Correlate with visual inspection to exclude cutaneous malignancy.   Electronically Signed   By: Julian Hy M.D.   On: 06/22/2015 18:29    EKG:   Sinus arrhythmia 97 bpm, nonspecific ST-T wave changes, PVCs.  IMPRESSION AND PLAN:   1. Heat stroke, Possible syncope, dehydration, relative hypotension. I will give IV fluid hydration. Check orthostatic vital signs. Monitor temperature curve. Check a CPK to see if the patient has rhabdomyolysis. Admit as observation on telemetry and check cardiac enzymes. Patient had a carotid ultrasound 03/01/2015 that showed patent left carotid endarterectomy and minimal plaque in the right carotid artery I will not repeat this exam. Hold Aldactone and Lasix. Check echo. 2. COPD exacerbation will start nebulizer treatments, low dose steroids. 3. History of cirrhosis, thrombocytopenia 4. Hyperlipidemia unspecified continue statin if CPK normal. 5. History of bladder cancer 6. Weakness we'll get physical therapy evaluation.  All  the records are reviewed and case discussed with ED provider. Management plans discussed with the patient, family and they are in agreement.  CODE STATUS: DO NOT RESUSCITATE  TOTAL TIME TAKING CARE OF THIS PATIENT: 50 minutes.    Loletha Grayer M.D on 06/22/2015 at 7:42 PM  Between 7am to 6pm - Pager - [phone]  After 6pm call admission pager [facility]  Office  [phone]  CC: Primary care physician; BABAOFF, Caryl Bis, MD

## 2015-06-22 NOTE — ED Provider Notes (Signed)
[facility] Emergency Department Provider Note ____________________________________________  Time seen: Approximately 5:15 PM  I have reviewed the triage vital signs and the nursing notes.   HISTORY  Chief Complaint Fall   HPI Jason Larsen is a 74 y.o. male with a history of CABG, COPD who was found unresponsive in the sunlight in his yard by his wife. When EMS reached him he had an axillary temperature of 102.7 so they administered 2 bags of cool IV fluids. Patient had last been seen normal when his wife left to run errands in the morning. The patient does not recall falling but states he does recall doing yard work. He is unsure what time he was out in the yard. He denies any pain and is presently alert and oriented. He denies any chest pain, states his breathing is at baseline, denies headache or any other complaints.  He ambulates with a walker at baseline as he has done since he had a similar incident several years ago.  He is typically followed at the [place] in [place]. 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  Past Medical History  Diagnosis Date  . Coronary heart disease   . Hyperlipidemia   . HTN (hypertension)   . COPD (chronic obstructive pulmonary disease)   . PVD (peripheral vascular disease)   . Atrial fibrillation     converted to NSR 5/09 on pacerone  . Shortness of breath     on exertion  . Diabetes mellitus without complication   . Arthritis     fingers  . Anemia   . DJD (degenerative joint disease) of hip   . GI bleed     01/2013 while on warfarin with a hemoglobin of 5.8 (INR was 8)  . CHF (congestive heart failure)     EF 50-55%  . Carotid artery occlusion   . Complication of anesthesia   . PONV (postoperative nausea and vomiting)   . Cataracts, bilateral     Hx: of  . Heart murmur   . Pneumonia     Hx: of  . Cancer of kidney   .  Skin cancer 12/2013    Rt. ear  . Bladder cancer   . Fall at home Dec. 2015  . Blood transfusion without reported diagnosis     Patient Active Problem List   Diagnosis Date Noted  . Heat exhaustion 04/04/2015  . Dehydration 04/04/2015  . Discoloration of skin-Bilat. foot 03/01/2015  . Ascites 01/19/2015  . Aftercare following surgery of the circulatory system, [place] 07/07/2014  . Diarrhea 01/12/2014  . Internal hemorrhoids without mention of complication 40/06/6760  . Encounter for therapeutic drug monitoring 12/14/2013  . Dizziness and giddiness 06/10/2013  . Occlusion and stenosis of carotid artery without mention of cerebral infarction 06/10/2013  . Long term (current) use of anticoagulants 05/25/2013  . Left carotid bruit 05/16/2013  . Hypokalemia 03/29/2013  . AVM (arteriovenous malformation) of colon with hemorrhage 02/23/2013  . Benign neoplasm of colon 02/23/2013  . Blood in stool 02/09/2013  . Personal history of colonic polyps 02/09/2013  . Smoker 11/06/2011  . ALLERGIC RHINITIS 03/14/2008  . Hyperlipidemia 02/11/2008  . ANEMIA 02/11/2008  . Essential hypertension 02/11/2008  . Coronary atherosclerosis 02/11/2008  . Atrial fibrillation 02/11/2008  . PERIPHERAL VASCULAR DISEASE 02/11/2008  . CHRONIC OBSTRUCTIVE PULMONARY DISEASE, SEVERE [place] C 02/11/2008    Past Surgical History  Procedure  Laterality Date  . Tonsillectomy      as child  . Colonoscopy N/A 02/23/2013    Procedure: COLONOSCOPY;  Surgeon: Inda Castle, MD;  Location: [facility];  Service: Endoscopy;  Laterality: N/A;  . Esophagogastroduodenoscopy N/A 02/23/2013    Procedure: ESOPHAGOGASTRODUODENOSCOPY (EGD);  Surgeon: Inda Castle, MD;  Location: [facility];  Service: Endoscopy;  Laterality: N/A;  . Hot hemostasis N/A 05/04/2013    Procedure: HOT HEMOSTASIS (ARGON PLASMA COAGULATION/BICAP);  Surgeon: Inda Castle, MD;  Location: [facility];  Service: Endoscopy;  Laterality: N/A;  .  Enteroscopy N/A 05/04/2013    Procedure: ENTEROSCOPY;  Surgeon: Inda Castle, MD;  Location: [facility];  Service: Endoscopy;  Laterality: N/A;  . Bypass graft  1997 ?  Marland Kitchen Coronary artery bypass graft      15-17 yrs  . Joint replacement Right      Hip 10-12 yrs  . Endarterectomy Left 06/15/2013    Procedure: ENDARTERECTOMY CAROTID- LEFT;  Surgeon: Conrad Manley, MD;  Location: [facility];  Service: Vascular;  Laterality: Left;  . Patch angioplasty Left 06/15/2013    Procedure: PATCH ANGIOPLASTY using Vascu-Guard Vascular Patch;  Surgeon: Conrad [place], MD;  Location: [place];  Service: Vascular;  Laterality: Left;  . Kidney surgery  Aug. 2015    Chemo-Bladder   . Cataract extraction, bilateral  2015  . Skin cancer excision Right Feb. 2015    Ear  . Carotid endarterectomy Left 06-15-13    cea  . Eye surgery    . Polypectomy  2014  . Nm myocar multiple w/spect  2013    MILD PERFUSION DEFECT SEEN IN THE MID INFERIOR AND APICAL INFERIOR REGIONS. THIS IS CONSISTANT WITH AN INFARCT/SCAR AND OVERLYING ATTENUATION.   . Cardiac catheterization  2009    EF 45%. 3+ MR.MODERATE PULMONARY HTN WITH PREASSURE AT 15MM. SUBTOTAL OCCLUSION OF THE LEFT MAIN, TOTAL PROXIMAL LAD OCCLUSION, TOTAL PROXIMAL CIR OCC. RCA WITH 95% FOLLOWED BY 99% STENOSIS. PATENT LIMA TO LAD. PATENT GRAFT TO OM1 AND OM2. PATENT GRAFT TO PDA. 60% LEFT RENAL ARTERY STENOSIS.  Marland Kitchen Renal duplex  2014    RRA- NORMAL PATENCY. L MAIN RA- <= 60% DIAMETER REDUCTION. L ACCESSORY RA- NORMAL PATENCY. [place].    Current Outpatient Rx  Name  Route  Sig  Dispense  Refill  . diltiazem (DILACOR XR) 120 MG 24 hr capsule   Oral   Take 1 capsule (120 mg total) by mouth every morning.   90 capsule   3   . albuterol (PROVENTIL) (2.5 MG/3ML) 0.083% nebulizer solution   Nebulization   Take 2.5 mg by nebulization every 4 (four) hours as needed for shortness of breath.          . allopurinol (ZYLOPRIM) 300 MG tablet   Oral   Take 300 mg by  mouth daily. Gout         . amiodarone (PACERONE) 200 MG tablet   Oral   Take 100 mg by mouth daily.          . cyclobenzaprine (FLEXERIL) 5 MG tablet   Oral   Take 5 mg by mouth at bedtime.         . docusate sodium (COLACE) 100 MG capsule   Oral   Take 100 mg by mouth daily as needed.          . ferrous sulfate 325 (65 FE) MG tablet   Oral   Take 325 mg by  mouth daily with breakfast.         . formoterol (FORADIL) 12 MCG capsule for inhaler   Inhalation   Place 1 capsule (12 mcg total) into inhaler and inhale 2 (two) times daily as needed for wheezing.   60 capsule   1   . furosemide (LASIX) 40 MG tablet   Oral   Take 40 mg by mouth daily.         Marland Kitchen gabapentin (NEURONTIN) 300 MG capsule   Oral   Take 600 mg by mouth 3 (three) times daily as needed (for sleep).          . naproxen sodium (ANAPROX) 220 MG tablet   Oral   Take 220 mg by mouth 2 (two) times daily as needed (for arthritis).         Marland Kitchen omeprazole (PRILOSEC) 20 MG capsule   Oral   Take 40 mg by mouth daily.          Marland Kitchen spironolactone (ALDACTONE) 50 MG tablet   Oral   Take 50 mg by mouth daily.         Marland Kitchen tiotropium (SPIRIVA) 18 MCG inhalation capsule   Inhalation   Place 1 capsule (18 mcg total) into inhaler and inhale daily after supper. Patient not taking: Reported on 04/04/2015   30 capsule   1   . Tiotropium Bromide-Olodaterol 2.5-2.5 MCG/ACT AERS   Inhalation   Inhale 2 puffs into the lungs at bedtime.         . traMADol (ULTRAM) 50 MG tablet   Oral   Take 50 mg by mouth every 8 (eight) hours as needed for moderate pain.           Allergies Review of patient's allergies indicates no known allergies.  Family History  Problem Relation Age of Onset  . Emphysema Father   . Heart disease Father   . Cancer Mother     unknwn type  . Tuberculosis Paternal Grandfather   . Peripheral vascular disease Sister   . Hyperlipidemia Brother   . Colon cancer Neg Hx      Social History Social History  Substance Use Topics  . Smoking status: Former Smoker -- 0.25 packs/day    Types: Cigarettes  . Smokeless tobacco: Never Used     Comment: Started smoking at age 89.  Tobacco info given 01/12/14  . Alcohol Use: 1.0 - 1.5 oz/week    2-3 Standard drinks or equivalent per week     Comment: Beer -haven't drank since Dec 2013    Review of Systems Constitutional: No fever ENT: No URI Cardiovascular: Denies chest pain. Respiratory: Shortness of breath at baseline Gastrointestinal: No abdominal pain. Musculoskeletal: Negative for back pain. Neurological: Negative for headaches Psychiatric:Normal mood Endocrine:No recent weight change 10-point ROS otherwise negative.  ____________________________________________   PHYSICAL EXAM:  VITAL SIGNS: ED Triage Vitals  Enc Vitals Group     BP 06/22/15 1710 106/65 mmHg     Pulse Rate 06/22/15 1710 97     Resp --      Temp 06/22/15 1710 99.5 F (37.5 C)     Temp Source 06/22/15 1710 Oral     SpO2 06/22/15 1710 96 %     Weight 06/22/15 1710 134 lb 11.2 oz (61.1 kg)     Height 06/22/15 1710 5\' 8"  (1.727 m)     Head Cir --      Peak Flow --      Pain Score --  Pain Loc --      Pain Edu? --      Excl. in [place]? --    Constitutional: Alert and oriented. Well appearing and in no acute distress. Eyes: Conjunctivae are normal. PERRL. EOMI. Head: Atraumatic. Nose: No congestion/rhinnorhea. Mouth/Throat: Mucous membranes are dry.  Oropharynx non-erythematous. Neck: No stridor.  No cervical spine tenderness. Nexus criteria negative. Lymphatic: No cervical lymphadenopathy. Cardiovascular: Normal rate, regular rhythm. 4/6 systolic murmur.  Peripheral pulses 2+ B Respiratory: Normal respiratory effort.  No retractions. Lungs scattered mild wheezing. Gastrointestinal: Soft and nontender. No distention. Normal bowel sounds.  Musculoskeletal: No lower extremity tenderness nor edema.  No calf  TTP. Neurologic:  Normal speech and language. No gross focal neurologic deficits are appreciated. Speech is normal. Involuntary movements of shoulders and neck Skin:  Skin is warm, dry and intact. Erythema of face and arms consistent with sunburn Psychiatric: Mood and affect are normal. Speech and behavior are normal.  ____________________________________________   LABS (all labs ordered are listed, but only abnormal results are displayed)  Labs Reviewed  COMPREHENSIVE METABOLIC PANEL - Abnormal; Notable for the following:    Glucose, Bld 113 (*)    BUN 22 (*)    Calcium 8.6 (*)    Total Protein 6.2 (*)    Albumin 3.2 (*)    AST 43 (*)    Total Bilirubin 1.3 (*)    All other components within normal limits  CBC WITH DIFFERENTIAL/PLATELET - Abnormal; Notable for the following:    RBC 3.44 (*)    Hemoglobin 11.2 (*)    HCT 33.3 (*)    RDW 15.6 (*)    Platelets 101 (*)    Lymphs Abs 0.4 (*)    All other components within normal limits  URINALYSIS COMPLETEWITH MICROSCOPIC ([facility] ONLY) - Abnormal; Notable for the following:    Color, Urine YELLOW (*)    APPearance CLEAR (*)    All other components within normal limits  TROPONIN I  CK  CKMB([facility] ONLY)   ____________________________________________  EKG  ED ECG REPORT I, Ponciano Ort, the attending physician, personally viewed and interpreted this ECG.   Date: 06/22/2015  Rate: 97  Rhythm: normal sinus rhythm 1 PVC  QRS Axis: normal  Intervals: normal  ST/T Wave abnormalities: T depression V3 through 5  Conduction Disutrbances: none Unchanged from 4/16   ____________________________________________  RADIOLOGY  CXR-IMPRESSION: Mild right basilar subsegmental atelectasis.  CT head-IMPRESSION: No evidence of acute intracranial abnormality.  Bilateral basal ganglia lacunar infarcts. Atrophy with small vessel ischemic changes.  Cutaneous thickening/irregularity along the anterior nose. Correlate with  visual inspection to exclude cutaneous malignancy.  ____________________________________________   PROCEDURES  Procedure(s) performed: none  Critical Care performed: none ____________________________________________   INITIAL IMPRESSION / ASSESSMENT AND PLAN / ED COURSE  Pertinent labs & imaging results that were available during my care of the patient were reviewed by me and considered in my medical decision making (see chart for details).  Upon arrival to the ER patient's rectal temp had dropped to 99. Therefore no further cooling measures are needed at this time. He had already received 2 L of IV normal saline. Will await lab work while we evaluate for syncope.  ----------------------------------------- 7:10 PM on 06/22/2015 -----------------------------------------  Patient's wife and other family members are now at the bedside. They note that he has been complaining of dizziness in the ER which appeared to occur when his oxygen had been removed and seems to resolve once his oxygen is replaced.  They state he prefers to be admitted here rather than transfer to the [place] and their insurance will cover this. Discussed case with [facility] who will admit. ____________________________________________   FINAL CLINICAL IMPRESSION(S) / ED DIAGNOSES  Final diagnoses:  Heat stroke, initial encounter  Syncope and collapse       Ponciano Ort, MD 06/22/15 1911

## 2015-06-22 NOTE — ED Notes (Signed)
This RN talked to hospitalist about pts decreased BP. No orders given at this time. Floor RN, Claiborne Billings Zielke informed of situation.

## 2015-06-22 NOTE — ED Notes (Signed)
Pt arrived to ED from home after unwitnessed fall in back yard. Unknown down time or amount of time in the sun. Temp of 102 axillary per EMS. Pt unresponsive uppon arrival of EMS. Pt moving upon arrival to ED.

## 2015-06-22 NOTE — ED Notes (Signed)
Pt received 1027ml NS from EMS.

## 2015-06-22 NOTE — ED Notes (Signed)
Attempt to call report.

## 2015-06-23 ENCOUNTER — Observation Stay (HOSPITAL_BASED_OUTPATIENT_CLINIC_OR_DEPARTMENT_OTHER)
Admit: 2015-06-23 | Discharge: 2015-06-23 | Disposition: A | Discharge status: Home / Self Care | Payer: Commercial Managed Care - HMO | Attending: Internal Medicine | Admitting: Internal Medicine

## 2015-06-23 DIAGNOSIS — I350 Nonrheumatic aortic (valve) stenosis: Secondary | ICD-10-CM

## 2015-06-23 LAB — CBC
HCT: 32.7 % — ABNORMAL LOW (ref 40.0–52.0)
Hemoglobin: 11 g/dL — ABNORMAL LOW (ref 13.0–18.0)
MCH: 33 pg (ref 26.0–34.0)
MCHC: 33.7 g/dL (ref 32.0–36.0)
MCV: 97.9 fL (ref 80.0–100.0)
Platelets: 86 10*3/uL — ABNORMAL LOW (ref 150–440)
RBC: 3.34 MIL/uL — ABNORMAL LOW (ref 4.40–5.90)
RDW: 15.4 % — AB (ref 11.5–14.5)
WBC: 2.6 10*3/uL — ABNORMAL LOW (ref 3.8–10.6)

## 2015-06-23 LAB — BASIC METABOLIC PANEL
Anion gap: 7 (ref 5–15)
BUN: 17 mg/dL (ref 6–20)
CO2: 24 mmol/L (ref 22–32)
CREATININE: 0.67 mg/dL (ref 0.61–1.24)
Calcium: 8.5 mg/dL — ABNORMAL LOW (ref 8.9–10.3)
Chloride: 108 mmol/L (ref 101–111)
GFR calc Af Amer: >60 mL/min (ref >60)
Glucose, Bld: 168 mg/dL — ABNORMAL HIGH (ref 65–99)
POTASSIUM: 4.5 mmol/L (ref 3.5–5.1)
Sodium: 139 mmol/L (ref 135–145)

## 2015-06-23 MED ORDER — AZITHROMYCIN 250 MG PO TABS: ORAL_TABLET | ORAL | Status: DC

## 2015-06-23 MED ORDER — PREDNISONE 10 MG (21) PO TBPK: 10.0000 mg | ORAL_TABLET | Freq: Every day | ORAL | Status: DC

## 2015-06-23 MED ORDER — FERROUS SULFATE 325 (65 FE) MG PO TABS
325.0000 mg | ORAL_TABLET | Freq: Every day | ORAL | Status: DC
  Administered 2015-06-23: 325 mg via ORAL
  Filled 2015-06-23: qty 1

## 2015-06-23 MED ORDER — DOCUSATE SODIUM 100 MG PO CAPS: 100.0000 mg | ORAL_CAPSULE | Freq: Two times a day (BID) | ORAL | Status: DC | PRN

## 2015-06-23 MED ORDER — TRAMADOL HCL 50 MG PO TABS
50.0000 mg | ORAL_TABLET | Freq: Every day | ORAL | Status: DC
  Administered 2015-06-23: 50 mg via ORAL
  Filled 2015-06-23: qty 1

## 2015-06-23 MED ORDER — AMIODARONE HCL 200 MG PO TABS
100.0000 mg | ORAL_TABLET | Freq: Every day | ORAL | Status: DC
  Administered 2015-06-23: 100 mg via ORAL
  Filled 2015-06-23: qty 2

## 2015-06-23 MED ORDER — GABAPENTIN 300 MG PO CAPS
300.0000 mg | ORAL_CAPSULE | Freq: Every day | ORAL | Status: DC
  Administered 2015-06-23: 300 mg via ORAL
  Filled 2015-06-23: qty 1

## 2015-06-23 MED ORDER — DILTIAZEM HCL ER 120 MG PO CP24
120.0000 mg | ORAL_CAPSULE | Freq: Every morning | ORAL | Status: DC
  Administered 2015-06-23: 120 mg via ORAL
  Filled 2015-06-23 (×2): qty 1

## 2015-06-23 MED ORDER — ALLOPURINOL 300 MG PO TABS
300.0000 mg | ORAL_TABLET | Freq: Every day | ORAL | Status: DC
  Administered 2015-06-23: 300 mg via ORAL
  Filled 2015-06-23: qty 1

## 2015-06-23 MED ORDER — ATORVASTATIN CALCIUM 20 MG PO TABS
10.0000 mg | ORAL_TABLET | Freq: Every day | ORAL | Status: DC
  Administered 2015-06-23: 10 mg via ORAL
  Filled 2015-06-23: qty 2

## 2015-06-23 MED ORDER — CYCLOBENZAPRINE HCL 10 MG PO TABS
5.0000 mg | ORAL_TABLET | Freq: Every day | ORAL | Status: DC
  Administered 2015-06-23: 5 mg via ORAL
  Filled 2015-06-23: qty 2

## 2015-06-23 MED ORDER — ALBUTEROL SULFATE (2.5 MG/3ML) 0.083% IN NEBU: 2.5000 mg | INHALATION_SOLUTION | RESPIRATORY_TRACT | Status: DC | PRN

## 2015-06-23 NOTE — Progress Notes (Signed)
Pt d/c to home today.  IV removed intact.  Pt d/c instructions reviewed and all questions and concerns addressed.  Rx printed and given to pt w/all questions and concerns addressed.  Education printed on D/C paperwork and given to pt.  Pt was educated on the signs and symptoms of syncope.  Pt was able to verbalize what to assess for while at home to help prevent syncopal episodes.  Pt was escorted by family to home.

## 2015-06-23 NOTE — Progress Notes (Signed)
*  PRELIMINARY RESULTS* Echocardiogram 2D Echocardiogram has been performed.  Jason Larsen Stills 06/23/2015, 10:56 AM

## 2015-06-26 NOTE — Discharge Summary (Signed)
Jason Larsen, is a 74 y.o. male  DOB Sep 01, 1941  MRN [id number].  Admission date:  06/22/2015  Admitting Physician  Loletha Grayer, MD  Discharge Date:  06/23/2015   Primary MD  BABAOFF, Caryl Bis, MD  Recommendations for primary care physician for things to follow:   Follow up with primary doctor in 1 week.   Admission Diagnosis  Syncope and collapse [R55] Heat stroke, initial encounter [T67.0XXA]   Discharge Diagnosis  Syncope and collapse [R55] Heat stroke, initial encounter [T67.0XXA]    Active Problems:   Syncope      Past Medical History  Diagnosis Date  . Coronary heart disease   . Hyperlipidemia   . HTN (hypertension)   . COPD (chronic obstructive pulmonary disease)   . PVD (peripheral vascular disease)   . Atrial fibrillation     converted to NSR 5/09 on pacerone  . Shortness of breath     on exertion  . Diabetes mellitus without complication   . Arthritis     fingers  . Anemia   . DJD (degenerative joint disease) of hip   . GI bleed     01/2013 while on warfarin with a hemoglobin of 5.8 (INR was 8)  . CHF (congestive heart failure)     EF 50-55%  . Carotid artery occlusion   . Complication of anesthesia   . PONV (postoperative nausea and vomiting)   . Cataracts, bilateral     Hx: of  . Heart murmur   . Pneumonia     Hx: of  . Cancer of kidney   . Skin cancer 12/2013    Rt. ear  . Bladder cancer   . Fall at home Dec. 2015  . Blood transfusion without reported diagnosis     Past Surgical History  Procedure Laterality Date  . Tonsillectomy      as child  . Colonoscopy N/A 02/23/2013    Procedure: COLONOSCOPY;  Surgeon: Inda Castle, MD;  Location: [facility];  Service: Endoscopy;  Laterality: N/A;  . Esophagogastroduodenoscopy N/A 02/23/2013    Procedure: ESOPHAGOGASTRODUODENOSCOPY  (EGD);  Surgeon: Inda Castle, MD;  Location: [facility];  Service: Endoscopy;  Laterality: N/A;  . Hot hemostasis N/A 05/04/2013    Procedure: HOT HEMOSTASIS (ARGON PLASMA COAGULATION/BICAP);  Surgeon: Inda Castle, MD;  Location: [facility];  Service: Endoscopy;  Laterality: N/A;  . Enteroscopy N/A 05/04/2013    Procedure: ENTEROSCOPY;  Surgeon: Inda Castle, MD;  Location: [facility];  Service: Endoscopy;  Laterality: N/A;  . Bypass graft  1997 ?  Marland Kitchen Coronary artery bypass graft      15-17 yrs  . Joint replacement Right      Hip 10-12 yrs  . Endarterectomy Left 06/15/2013    Procedure: ENDARTERECTOMY CAROTID- LEFT;  Surgeon: Conrad [place], MD;  Location: [place];  Service: Vascular;  Laterality: Left;  . Patch angioplasty Left 06/15/2013    Procedure: PATCH ANGIOPLASTY using Vascu-Guard Vascular Patch;  Surgeon: Conrad [place], MD;  Location: [place];  Service: Vascular;  Laterality: Left;  . Kidney surgery  Aug. 2015    Chemo-Bladder   . Cataract extraction, bilateral  2015  . Skin cancer excision Right Feb. 2015    Ear  . Carotid endarterectomy Left 06-15-13    cea  . Eye surgery    . Polypectomy  2014  . Nm myocar multiple w/spect  2013    MILD PERFUSION DEFECT SEEN IN THE MID INFERIOR AND  APICAL INFERIOR REGIONS. THIS IS CONSISTANT WITH AN INFARCT/SCAR AND OVERLYING ATTENUATION.   . Cardiac catheterization  2009    EF 45%. 3+ MR.MODERATE PULMONARY HTN WITH PREASSURE AT 15MM. SUBTOTAL OCCLUSION OF THE LEFT MAIN, TOTAL PROXIMAL LAD OCCLUSION, TOTAL PROXIMAL CIR OCC. RCA WITH 95% FOLLOWED BY 99% STENOSIS. PATENT LIMA TO LAD. PATENT GRAFT TO OM1 AND OM2. PATENT GRAFT TO PDA. 60% LEFT RENAL ARTERY STENOSIS.  Marland Kitchen Renal duplex  2014    RRA- NORMAL PATENCY. L MAIN RA- <= 60% DIAMETER REDUCTION. L ACCESSORY RA- NORMAL PATENCY. [place].       History of present illness and  Hospital Course:     Kindly see H&P for history of present illness and admission details, please  review complete Labs, Consult reports and Test reports for all details in brief  HPI  from the history and physical done on the day of admission 74 year old male patient with history of COPD, cirrhosis, bladder cancer brought in because of syncope. Admitted for heat stroke.  Hospital Course  #1 syncope secondary to dehydration and the heat stroke.; Symptoms improved with hydration. Patient started on IV fluids. ABC and the Chem-7 are normal. Monitor on telemetry did not have any arrhythmias echocardiogram showed EF of. 55% patient discharged home in stable condition. We held his Lasix, Aldactone on admission because of syncope and dehydration but resume at discharge.   2.y of COPD with mild flare so gave a prescription for azithromycin therapy to steroids. History of COPD; he is on inhalers at home. History of cirrhosis History of hypertension History of diabetes mellitus type 2;  Atrial fibrillation rate controlled and also in sinus rhythm, he is on amiodarone, Cardizem. Discharge Condition: Stable  Follow UP  Follow-up Information    Follow up with BABAOFF, MARC E, MD In 1 week.   Specialty:  Family Medicine   Contact information:   [address] [address] [phone]         Discharge Instructions  and  Discharge Medications        Medication List    STOP taking these medications        furosemide 40 MG tablet  Commonly known as:  LASIX      TAKE these medications        albuterol (2.5 MG/3ML) 0.083% nebulizer solution  Commonly known as:  PROVENTIL  Take 2.5 mg by nebulization every 4 (four) hours as needed for shortness of breath.     allopurinol 300 MG tablet  Commonly known as:  ZYLOPRIM  Take 300 mg by mouth daily.     amiodarone 200 MG tablet  Commonly known as:  PACERONE  Take 100 mg by mouth daily.     atorvastatin 10 MG tablet  Commonly known as:  LIPITOR  Take 10 mg by mouth daily.     azithromycin 250 MG tablet  Commonly known  as:  ZITHROMAX  For bronchtis     cyclobenzaprine 5 MG tablet  Commonly known as:  FLEXERIL  Take 5 mg by mouth at bedtime.     diltiazem 120 MG 24 hr capsule  Commonly known as:  DILACOR XR  Take 1 capsule (120 mg total) by mouth every morning.     docusate sodium 100 MG capsule  Commonly known as:  COLACE  Take 100 mg by mouth 2 (two) times daily as needed for mild constipation.     ferrous sulfate 325 (65 FE) MG tablet  Take  325 mg by mouth daily.     formoterol 12 MCG capsule for inhaler  Commonly known as:  FORADIL  Place 1 capsule (12 mcg total) into inhaler and inhale 2 (two) times daily as needed for wheezing.     gabapentin 300 MG capsule  Commonly known as:  NEURONTIN  Take 300 mg by mouth at bedtime.     naproxen sodium 220 MG tablet  Commonly known as:  ANAPROX  Take 220 mg by mouth 2 (two) times daily as needed (for pain).     predniSONE 10 MG (21) Tbpk tablet  Commonly known as:  STERAPRED UNI-PAK 21 TAB  Take 1 tablet (10 mg total) by mouth daily. 3 tab daily for 2 days 2 tab daily for 2 days One tab daily for 2 days     spironolactone 50 MG tablet  Commonly known as:  ALDACTONE  Take 50 mg by mouth daily.     tiotropium 18 MCG inhalation capsule  Commonly known as:  SPIRIVA  Place 1 capsule (18 mcg total) into inhaler and inhale daily after supper.     traMADol 50 MG tablet  Commonly known as:  ULTRAM  Take 50-100 mg by mouth at bedtime.          Diet and Activity recommendation: See Discharge Instructions above   Consults obtained - none   Major procedures and Radiology Reports - PLEASE review detailed and final reports for all details, in brief -     Dg Chest 2 View  06/22/2015   CLINICAL DATA:  Syncope.  EXAM: CHEST  2 VIEW  COMPARISON:  Apr 04, 2015.  FINDINGS: The heart size and mediastinal contours are within normal limits. Status post coronary artery bypass graft. Old right rib fractures are noted. No pneumothorax or pleural  effusion is noted. Mild right basilar opacity is noted most consistent with subsegmental atelectasis. Left lung is clear.  IMPRESSION: Mild right basilar subsegmental atelectasis.   Electronically Signed   By: Marijo Conception, M.D.   On: 06/22/2015 17:56   Ct Head Wo Contrast  06/22/2015   CLINICAL DATA:  Unwitnessed fall  EXAM: CT HEAD WITHOUT CONTRAST  TECHNIQUE: Contiguous axial images were obtained from the base of the skull through the vertex without intravenous contrast.  COMPARISON:  04/04/2015  FINDINGS: No evidence of parenchymal hemorrhage or extra-axial fluid collection. No mass lesion, mass effect, or midline shift.  No CT evidence of acute infarction. Bilateral basal ganglia lacunar infarcts.  Subcortical white matter and periventricular small vessel ischemic changes. Intracranial atherosclerosis.  Partial opacification of the left ethmoid sinus. Mastoid air cells are clear.  Cutaneous thickening/irregularity along the anterior nose (series 2/image 3).  No evidence of calvarial fracture.  IMPRESSION: No evidence of acute intracranial abnormality.  Bilateral basal ganglia lacunar infarcts. Atrophy with small vessel ischemic changes.  Cutaneous thickening/irregularity along the anterior nose. Correlate with visual inspection to exclude cutaneous malignancy.   Electronically Signed   By: Julian Hy M.D.   On: 06/22/2015 18:29   Mr Lumbar Spine Wo Contrast  05/28/2015   CLINICAL DATA:  Low back pain with bilateral leg pain. Fall 11/02/2014.  EXAM: MRI LUMBAR SPINE WITHOUT CONTRAST  TECHNIQUE: Multiplanar, multisequence MR imaging of the lumbar spine was performed. No intravenous contrast was administered.  COMPARISON:  Chest CT 11/04/2014.  FINDINGS: For the purposes of this dictation, the lowest well-formed intervertebral disc space is presumed to be L5-S1.  There is no significant listhesis. Chronic, mild T12  superior endplate compression fracture is unchanged from the prior chest CT. L4 and  L5 compression fractures demonstrate 55% and 25% height loss, respectively, with moderate marrow edema at both levels. There is also a fracture involving the L3 inferior endplate with mild edema and minimal vertebral body height loss. No significant retropulsion is present associated with any of these fractures. Conus medullaris is normal in signal and terminates at the superior aspect of L1. There is diffuse lumbar disc desiccation. Renal cysts measure 1.7 cm on the right and 2.3 cm on the left.  L1-2: Minimal disc bulging without stenosis.  L2-3: Mild disc bulging and mild facet hypertrophy result in mild left lateral recess stenosis without significant spinal canal or neural foraminal stenosis.  L3-4: Mild disc bulging asymmetric to the left and mild facet hypertrophy result in moderate left neural foraminal stenosis without spinal stenosis.  L4-5: Mild disc bulging and facet hypertrophy result in severe bilateral neural foraminal stenosis without spinal stenosis.  L5-S1: Mild disc bulging and facet hypertrophy result in moderate right and mild left neural foraminal stenosis without spinal stenosis.  IMPRESSION: 1. Acute to subacute compression fractures, mild at L3 and L5 and moderate at L4. 2. Mild disc and facet degeneration without spinal stenosis. Severe bilateral neural foraminal stenosis at L4-5.   Electronically Signed   By: Logan Bores   On: 05/28/2015 16:20    Micro Results    No results found for this or any previous visit (from the past 240 hour(s)).     Today   Subjective:   Jason Larsen today has no headache,no chest abdominal pain,no new weakness tingling or numbness, feels much better wants to go home today.   Objective:   Blood pressure 104/58, pulse 76, temperature 97.8 F (36.6 C), temperature source Oral, resp. rate 18, height 5\' 5"  (1.651 m), weight 50.2 kg (110 lb 10.7 oz), SpO2 96 %.  No intake or output data in the 24 hours ending 06/26/15 1405  Exam Awake Alert,  Oriented x 3, No new F.N deficits, Normal affect [place].AT,PERRAL Supple Neck,No JVD, No cervical lymphadenopathy appriciated.  Symmetrical Chest wall movement, Good air movement bilaterally, CTAB RRR,No Gallops,Rubs or new Murmurs, No Parasternal Heave +ve B.Sounds, Abd Soft, Non tender, No organomegaly appriciated, No rebound -guarding or rigidity. No Cyanosis, Clubbing or edema, No new Rash or bruise  Data Review   CBC w Diff:  Lab Results  Component Value Date   WBC 2.6* 06/23/2015   WBC 5.0 11/04/2014   WBC 6.6 08/25/2013   HGB 11.0* 06/23/2015   HGB 12.5* 11/04/2014   HCT 32.7* 06/23/2015   HCT 38.7* 11/04/2014   PLT 86* 06/23/2015   PLT 95* 11/04/2014   LYMPHOPCT 7 06/22/2015   LYMPHOPCT 14.2 02/14/2014   MONOPCT 8 06/22/2015   MONOPCT 8.6 02/14/2014   EOSPCT 0 06/22/2015   EOSPCT 0.4 02/14/2014   BASOPCT 0 06/22/2015   BASOPCT 1.0 02/14/2014    CMP:  Lab Results  Component Value Date   NA 139 06/23/2015   NA 142 01/26/2015   NA 141 11/04/2014   K 4.5 06/23/2015   K 4.0 11/04/2014   CL 108 06/23/2015   CL 107 11/04/2014   CO2 24 06/23/2015   CO2 27 11/04/2014   BUN 17 06/23/2015   BUN 14 01/26/2015   BUN 15 11/04/2014   CREATININE 0.67 06/23/2015   CREATININE 0.79 11/04/2014   PROT 6.2* 06/22/2015   PROT 5.8* 01/26/2015   PROT 7.2 11/04/2014  ALBUMIN 3.2* 06/22/2015   ALBUMIN 3.1* 11/04/2014   BILITOT 1.3* 06/22/2015   BILITOT 0.7 01/26/2015   BILITOT 0.9 11/04/2014   ALKPHOS 114 06/22/2015   ALKPHOS 125* 11/04/2014   AST 43* 06/22/2015   AST 80* 11/04/2014   ALT 36 06/22/2015   ALT 58 11/04/2014  .   Total Time in preparing paper work, data evaluation and todays exam - 75 minutes  Cristian Davitt M.D on 06/23/2015 at 2:05 PM

## 2015-06-30 ENCOUNTER — Other Ambulatory Visit: Payer: Self-pay | Admitting: Cardiovascular Disease

## 2015-07-02 ENCOUNTER — Other Ambulatory Visit: Payer: Self-pay | Admitting: *Deleted

## 2015-07-02 ENCOUNTER — Telehealth: Payer: Self-pay | Admitting: *Deleted

## 2015-07-02 MED ORDER — AMIODARONE HCL 100 MG PO TABS: ORAL_TABLET | ORAL | Status: AC

## 2015-07-02 NOTE — Telephone Encounter (Signed)
Pharmacy currently closed does not open until 9:00 am.

## 2015-07-02 NOTE — Telephone Encounter (Signed)
Pt is currently taking amiodarone 100 mg takes 1 tablet daily.

## 2015-07-02 NOTE — Telephone Encounter (Signed)
pharmacy calling needing clarification on Amiodarone  Please advise.

## 2015-07-04 ENCOUNTER — Other Ambulatory Visit: Payer: Self-pay | Admitting: *Deleted

## 2015-07-13 ENCOUNTER — Encounter: Payer: Self-pay | Admitting: Cardiovascular Disease

## 2015-07-13 ENCOUNTER — Ambulatory Visit (INDEPENDENT_AMBULATORY_CARE_PROVIDER_SITE_OTHER): Payer: Commercial Managed Care - HMO | Admitting: Cardiovascular Disease

## 2015-07-13 VITALS — BP 115/66 | HR 69 | Ht 64.0 in | Wt 105.0 lb

## 2015-07-13 DIAGNOSIS — I4891 Unspecified atrial fibrillation: Secondary | ICD-10-CM | POA: Diagnosis not present

## 2015-07-13 DIAGNOSIS — R188 Other ascites: Secondary | ICD-10-CM

## 2015-07-13 DIAGNOSIS — I2510 Atherosclerotic heart disease of native coronary artery without angina pectoris: Secondary | ICD-10-CM

## 2015-07-13 MED ORDER — FUROSEMIDE 20 MG PO TABS: 20.0000 mg | ORAL_TABLET | Freq: Every day | ORAL | Status: AC

## 2015-07-13 NOTE — Assessment & Plan Note (Signed)
He is maintaining in sinus rhythm on low-dose amiodarone. I made no changes today. He is not a candidate for anticoagulation as outlined above.

## 2015-07-13 NOTE — Progress Notes (Signed)
HPI  This is a 74 year old male who is here today for a followup visit. He has known history of coronary artery disease status post four-vessel CABG in 1997 at [facility], persistent atrial fibrillation maintained in sinus rhythm on amiodarone, COPD with tobacco use, hyperlipidemia, hypertension, recently diagnosed liver cirrhosis, carotid artery disease status post left carotid endarterectomy, type 2 diabetes and GI bleed in March of 2014 with supratherapeutic INR (8).  Most recent echocardiogram was done in August 2016 which showed an ejection fraction of 50-55%, mildly dilated left atrium, mild aortic stenosis . The patient had recurrent GI bleed while on anticoagulation with warfarin. He was ultimately taken off anticoagulation altogether after he was diagnosed with liver cirrhosis. He had significant ascites recently with fluid overload. This improved significantly after the addition of spironolactone and furosemide.  However, the patient continues to lose weight and was hospitalized this month at [facility] with syncope thought to be due to dehydration and heat stroke. The patient improved with hydration. His diuretics were resumed at hospital discharge. The patient's weight is down to 105 pounds. This makes a total of 32 pounds of weight loss since early this year. He continues to be fatigued and tired with low energy. He denies chest pain or shortness of breath but he seems to be declining from a functional standpoint.    No Known Allergies   Current Outpatient Prescriptions on File Prior to Visit  Medication Sig Dispense Refill  . albuterol (PROVENTIL) (2.5 MG/3ML) 0.083% nebulizer solution Take 2.5 mg by nebulization every 4 (four) hours as needed for shortness of breath.     . allopurinol (ZYLOPRIM) 300 MG tablet Take 300 mg by mouth daily.     Marland Kitchen amiodarone (PACERONE) 100 MG tablet TAKE 1 TABLET (100 MG) BY MOUTH ONCE DAILY 90 tablet 3  . atorvastatin (LIPITOR) 10 MG tablet Take 10 mg by mouth  daily.    Marland Kitchen diltiazem (DILACOR XR) 120 MG 24 hr capsule Take 1 capsule (120 mg total) by mouth every morning. 90 capsule 3  . docusate sodium (COLACE) 100 MG capsule Take 100 mg by mouth 2 (two) times daily as needed for mild constipation.     . ferrous sulfate 325 (65 FE) MG tablet Take 325 mg by mouth daily.     . formoterol (FORADIL) 12 MCG capsule for inhaler Place 1 capsule (12 mcg total) into inhaler and inhale 2 (two) times daily as needed for wheezing. 60 capsule 1  . spironolactone (ALDACTONE) 50 MG tablet Take 50 mg by mouth daily.    Marland Kitchen tiotropium (SPIRIVA) 18 MCG inhalation capsule Place 1 capsule (18 mcg total) into inhaler and inhale daily after supper. 30 capsule 1  . traMADol (ULTRAM) 50 MG tablet Take 50-100 mg by mouth at bedtime.      No current facility-administered medications on file prior to visit.     Past Medical History  Diagnosis Date  . Coronary heart disease   . Hyperlipidemia   . HTN (hypertension)   . COPD (chronic obstructive pulmonary disease)   . PVD (peripheral vascular disease)   . Atrial fibrillation     converted to NSR 5/09 on pacerone  . Shortness of breath     on exertion  . Diabetes mellitus without complication   . Arthritis     fingers  . Anemia   . DJD (degenerative joint disease) of hip   . GI bleed     01/2013 while on warfarin with a hemoglobin of  5.8 (INR was 8)  . CHF (congestive heart failure)     EF 50-55%  . Carotid artery occlusion   . Complication of anesthesia   . PONV (postoperative nausea and vomiting)   . Cataracts, bilateral     Hx: of  . Heart murmur   . Pneumonia     Hx: of  . Cancer of kidney   . Skin cancer 12/2013    Rt. ear  . Bladder cancer   . Fall at home Dec. 2015  . Blood transfusion without reported diagnosis      Past Surgical History  Procedure Laterality Date  . Tonsillectomy      as child  . Colonoscopy N/A 02/23/2013    Procedure: COLONOSCOPY;  Surgeon: Inda Castle, MD;  Location: [facility];  Service: Endoscopy;  Laterality: N/A;  . Esophagogastroduodenoscopy N/A 02/23/2013    Procedure: ESOPHAGOGASTRODUODENOSCOPY (EGD);  Surgeon: Inda Castle, MD;  Location: [facility];  Service: Endoscopy;  Laterality: N/A;  . Hot hemostasis N/A 05/04/2013    Procedure: HOT HEMOSTASIS (ARGON PLASMA COAGULATION/BICAP);  Surgeon: Inda Castle, MD;  Location: [facility];  Service: Endoscopy;  Laterality: N/A;  . Enteroscopy N/A 05/04/2013    Procedure: ENTEROSCOPY;  Surgeon: Inda Castle, MD;  Location: [facility];  Service: Endoscopy;  Laterality: N/A;  . Bypass graft  1997 ?  Marland Kitchen Coronary artery bypass graft      15-17 yrs  . Joint replacement Right      Hip 10-12 yrs  . Endarterectomy Left 06/15/2013    Procedure: ENDARTERECTOMY CAROTID- LEFT;  Surgeon: Conrad [place], MD;  Location: [place];  Service: Vascular;  Laterality: Left;  . Patch angioplasty Left 06/15/2013    Procedure: PATCH ANGIOPLASTY using Vascu-Guard Vascular Patch;  Surgeon: Conrad Steger, MD;  Location: [place];  Service: Vascular;  Laterality: Left;  . Kidney surgery  Aug. 2015    Chemo-Bladder   . Cataract extraction, bilateral  2015  . Skin cancer excision Right Feb. 2015    Ear  . Carotid endarterectomy Left 06-15-13    cea  . Eye surgery    . Polypectomy  2014  . Nm myocar multiple w/spect  2013    MILD PERFUSION DEFECT SEEN IN THE MID INFERIOR AND APICAL INFERIOR REGIONS. THIS IS CONSISTANT WITH AN INFARCT/SCAR AND OVERLYING ATTENUATION.   . Cardiac catheterization  2009    EF 45%. 3+ MR.MODERATE PULMONARY HTN WITH PREASSURE AT 15MM. SUBTOTAL OCCLUSION OF THE LEFT MAIN, TOTAL PROXIMAL LAD OCCLUSION, TOTAL PROXIMAL CIR OCC. RCA WITH 95% FOLLOWED BY 99% STENOSIS. PATENT LIMA TO LAD. PATENT GRAFT TO OM1 AND OM2. PATENT GRAFT TO PDA. 60% LEFT RENAL ARTERY STENOSIS.  Marland Kitchen Renal duplex  2014    RRA- NORMAL PATENCY. L MAIN RA- <= 60% DIAMETER REDUCTION. L ACCESSORY RA- NORMAL PATENCY. [place].      Family History  Problem Relation Age of Onset  . Emphysema Father   . Heart disease Father   . Cancer Mother     unknwn type  . Tuberculosis Paternal Grandfather   . Peripheral vascular disease Sister   . Hyperlipidemia Brother   . Colon cancer Neg Hx      Social History   Social History  . Marital Status: Married    Spouse Name: N/A  . Number of Children: 3  . Years of Education: N/A   Occupational History  . retired- worked in a print shop x35 yrs  Social History Main Topics  . Smoking status: Current Every Day Smoker -- 0.25 packs/day    Types: Cigarettes  . Smokeless tobacco: Never Used     Comment: Started smoking at age 1.  Tobacco info given 01/12/14  . Alcohol Use: 1.0 - 1.5 oz/week    2-3 Standard drinks or equivalent per week     Comment: Beer -haven't drank since Dec 2013  . Drug Use: No  . Sexual Activity: No   Other Topics Concern  . Not on file   Social History Narrative      PHYSICAL EXAM   BP 115/66 mmHg  Pulse 69  Ht 5\' 4"  (1.626 m)  Wt 105 lb (47.628 kg)  BMI 18.01 kg/m2 Constitutional: He is oriented to person, place, and time. He appears well-developed and well-nourished. No distress.  HENT: No nasal discharge.  Head: Normocephalic and atraumatic.  Eyes: Pupils are equal and round.  Neck: Normal range of motion. Neck supple. No JVD present. No thyromegaly present. There is a faint left carotid bruit. Cardiovascular: Normal rate, regular rhythm, normal heart sounds. Exam reveals no gallop and no friction rub. There is a 2/6 systolic murmur at the left sternal border. Pulmonary/Chest: Effort normal and breath sounds normal. No stridor. No respiratory distress. He has no wheezes. He has no rales. He exhibits no tenderness.  Abdominal: Soft. Bowel sounds are normal. He exhibits no distension. There is no tenderness. There is no rebound and no guarding.  Musculoskeletal: Normal range of motion. He exhibits no edema and no tenderness.   Neurological: He is alert and oriented to person, place, and time. Coordination normal.  Skin: Skin is warm and dry. No rash noted. He is not diaphoretic. No erythema. No pallor.  Psychiatric: He has a normal mood and affect. His behavior is normal. Judgment and thought content normal.       EKG: Sinus  Rhythm  -First degree A-V block  - occasional ectopic ventricular beat    PRi = 252 -Nonspecific ST depression   +   Diffuse nonspecific T-abnormality  -Nondiagnostic.   ABNORMAL    ASSESSMENT AND PLAN

## 2015-07-13 NOTE — Assessment & Plan Note (Signed)
He has no symptoms of angina. 

## 2015-07-13 NOTE — Patient Instructions (Signed)
Medication Instructions:  Your physician has recommended you make the following change in your medication:  DECREASE lasix to 20mg  once per day   Labwork: none  Testing/Procedures: none  Follow-Up: Your physician recommends that you schedule a follow-up appointment in: four months with Dr. Fletcher Anon.    Any Other Special Instructions Will Be Listed Below (If Applicable).

## 2015-07-13 NOTE — Assessment & Plan Note (Signed)
The patient seems to be declining overall from a functional standpoint and continues to lose weight. I decreased the dose of Lasix to 20 mg once daily. The dose of spironolactone was recently decreased to 25 mg once daily. I asked him to follow-up with his primary care physician to address these issues.

## 2015-07-26 ENCOUNTER — Ambulatory Visit: Payer: Commercial Managed Care - HMO | Admitting: Surgery

## 2015-07-26 ENCOUNTER — Other Ambulatory Visit: Payer: Self-pay | Admitting: Physical Medicine and Rehabilitation

## 2015-07-26 DIAGNOSIS — M545 Low back pain, unspecified: Secondary | ICD-10-CM

## 2015-07-27 ENCOUNTER — Emergency Department: Payer: Commercial Managed Care - HMO

## 2015-07-27 ENCOUNTER — Encounter: Payer: Self-pay | Admitting: Emergency Medicine

## 2015-07-27 ENCOUNTER — Emergency Department
Admission: EM | Admit: 2015-07-27 | Discharge: 2015-07-27 | Disposition: A | Discharge status: Hospice | Payer: Commercial Managed Care - HMO | Attending: Emergency Medicine | Admitting: Emergency Medicine

## 2015-07-27 DIAGNOSIS — R531 Weakness: Secondary | ICD-10-CM | POA: Diagnosis present

## 2015-07-27 DIAGNOSIS — Y998 Other external cause status: Secondary | ICD-10-CM | POA: Insufficient documentation

## 2015-07-27 DIAGNOSIS — Z720 Tobacco use: Secondary | ICD-10-CM | POA: Diagnosis not present

## 2015-07-27 DIAGNOSIS — S0990XA Unspecified injury of head, initial encounter: Secondary | ICD-10-CM | POA: Diagnosis not present

## 2015-07-27 DIAGNOSIS — S0083XA Contusion of other part of head, initial encounter: Secondary | ICD-10-CM | POA: Insufficient documentation

## 2015-07-27 DIAGNOSIS — E119 Type 2 diabetes mellitus without complications: Secondary | ICD-10-CM | POA: Diagnosis not present

## 2015-07-27 DIAGNOSIS — S2232XA Fracture of one rib, left side, initial encounter for closed fracture: Secondary | ICD-10-CM | POA: Diagnosis not present

## 2015-07-27 DIAGNOSIS — Y9289 Other specified places as the place of occurrence of the external cause: Secondary | ICD-10-CM | POA: Diagnosis not present

## 2015-07-27 DIAGNOSIS — W19XXXA Unspecified fall, initial encounter: Secondary | ICD-10-CM | POA: Diagnosis not present

## 2015-07-27 DIAGNOSIS — S3282XA Multiple fractures of pelvis without disruption of pelvic ring, initial encounter for closed fracture: Secondary | ICD-10-CM | POA: Diagnosis not present

## 2015-07-27 DIAGNOSIS — E860 Dehydration: Secondary | ICD-10-CM | POA: Diagnosis not present

## 2015-07-27 DIAGNOSIS — R17 Unspecified jaundice: Secondary | ICD-10-CM | POA: Insufficient documentation

## 2015-07-27 DIAGNOSIS — Y9301 Activity, walking, marching and hiking: Secondary | ICD-10-CM | POA: Diagnosis not present

## 2015-07-27 DIAGNOSIS — S32810A Multiple fractures of pelvis with stable disruption of pelvic ring, initial encounter for closed fracture: Secondary | ICD-10-CM

## 2015-07-27 DIAGNOSIS — I10 Essential (primary) hypertension: Secondary | ICD-10-CM | POA: Diagnosis not present

## 2015-07-27 LAB — COMPREHENSIVE METABOLIC PANEL
ALT: 55 U/L (ref 17–63)
ANION GAP: 10 (ref 5–15)
AST: 91 U/L — ABNORMAL HIGH (ref 15–41)
Albumin: 2.9 g/dL — ABNORMAL LOW (ref 3.5–5.0)
Alkaline Phosphatase: 129 U/L — ABNORMAL HIGH (ref 38–126)
BUN: 32 mg/dL — AB (ref 6–20)
CHLORIDE: 96 mmol/L — AB (ref 101–111)
CO2: 27 mmol/L (ref 22–32)
Calcium: 8.5 mg/dL — ABNORMAL LOW (ref 8.9–10.3)
Creatinine, Ser: 0.94 mg/dL (ref 0.61–1.24)
GLUCOSE: 99 mg/dL (ref 65–99)
Potassium: 4.3 mmol/L (ref 3.5–5.1)
Sodium: 133 mmol/L — ABNORMAL LOW (ref 135–145)
TOTAL PROTEIN: 5.7 g/dL — AB (ref 6.5–8.1)
Total Bilirubin: 4.7 mg/dL — ABNORMAL HIGH (ref 0.3–1.2)

## 2015-07-27 LAB — CBC WITH DIFFERENTIAL/PLATELET
BASOS PCT: 0 %
Basophils Absolute: 0 10*3/uL (ref 0–0.1)
EOS PCT: 0 %
Eosinophils Absolute: 0 10*3/uL (ref 0–0.7)
HCT: 31.3 % — ABNORMAL LOW (ref 40.0–52.0)
Hemoglobin: 10.6 g/dL — ABNORMAL LOW (ref 13.0–18.0)
LYMPHS ABS: 0.5 10*3/uL — AB (ref 1.0–3.6)
Lymphocytes Relative: 4 %
MCH: 32.4 pg (ref 26.0–34.0)
MCHC: 33.7 g/dL (ref 32.0–36.0)
MCV: 96.2 fL (ref 80.0–100.0)
MONOS PCT: 7 %
Monocytes Absolute: 0.8 10*3/uL (ref 0.2–1.0)
NEUTROS ABS: 10.7 10*3/uL — AB (ref 1.4–6.5)
Neutrophils Relative %: 89 %
PLATELETS: 88 10*3/uL — AB (ref 150–440)
RBC: 3.25 MIL/uL — AB (ref 4.40–5.90)
RDW: 16.7 % — ABNORMAL HIGH (ref 11.5–14.5)
WBC: 12 10*3/uL — ABNORMAL HIGH (ref 3.8–10.6)

## 2015-07-27 LAB — URINALYSIS COMPLETE WITH MICROSCOPIC (ARMC ONLY)
BACTERIA UA: NONE SEEN
Bilirubin Urine: NEGATIVE
GLUCOSE, UA: NEGATIVE mg/dL
HGB URINE DIPSTICK: NEGATIVE
Ketones, ur: NEGATIVE mg/dL
Leukocytes, UA: NEGATIVE
Nitrite: NEGATIVE
PROTEIN: NEGATIVE mg/dL
SQUAMOUS EPITHELIAL / LPF: NONE SEEN
Specific Gravity, Urine: 1.023 (ref 1.005–1.030)
pH: 5 (ref 5.0–8.0)

## 2015-07-27 LAB — TROPONIN I: Troponin I: 0.04 ng/mL — ABNORMAL HIGH (ref <0.031)

## 2015-07-27 LAB — CK: Total CK: 443 U/L — ABNORMAL HIGH (ref 49–397)

## 2015-07-27 MED ORDER — LORAZEPAM 2 MG/ML IJ SOLN
1.0000 mg | Freq: Once | INTRAMUSCULAR | Status: AC
  Administered 2015-07-27: 1 mg via INTRAVENOUS
  Filled 2015-07-27: qty 1

## 2015-07-27 MED ORDER — OXYCODONE HCL 5 MG PO CAPS: 5.0000 mg | ORAL_CAPSULE | ORAL | Status: AC | PRN

## 2015-07-27 MED ORDER — SODIUM CHLORIDE 0.9 % IV SOLN
Freq: Once | INTRAVENOUS | Status: AC
  Administered 2015-07-27: 11:00:00 via INTRAVENOUS

## 2015-07-27 NOTE — Progress Notes (Signed)
ED visit made. 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 He is a FULL CODE.  Patient was sent to the ED this morning from home after being seen by his hospice nurse. Family contacted hospice and reported that patient had had a change in mental status. She found him to be oriented but lethargic. Per report of family patient had a fall on Tuesday and since then had not wanted to eat or take his medications, he has not been ambulating. Patient's oxygen saturations were 84%. He normally wears oxygen at night only. Patient seen lying on the ED stretcher, appears pale, skin cool, respiratory rate 27, heart rate elevated 110-120's. Patient's brother Patrick Jupiter present at bedside. He reports the difficulty patient's family is having caring for Mr. Yom. Mrs. Jeffrey is scheduled to have surgery next week, he has 3 sons, one of which works in [place].  Writer did broach the possiblity of the hospice home, patient's brother stated that he was unable to make that decision, he knows his brother has wanted to only be at home. Dr. Jimmye Norman  and [facility] [name] made aware.  Patient responded briefly during visit, per discussion with attending ED physician Dr. Jimmye Norman, patient did receive a dose of IV ativan as he was restless and unable to hold still for xrays ordered. He has received IV fluids, xray results pending. Will continue to follow through discharge and update hospice team. Flo Shanks RN, BSN, [facility], hospital Liaison [phone] c

## 2015-07-27 NOTE — ED Provider Notes (Signed)
[facility] Emergency Department Provider Note     Time seen: ----------------------------------------- 10:27 AM on 07/27/2015 -----------------------------------------    I have reviewed the triage vital signs and the nursing notes.   HISTORY  Chief Complaint Weakness    HPI Jason Larsen is a 74 y.o. male who presents to ER being brought by EMS for weakness. Patient had fallen on Tuesday and has not been out of bed since that period time. Patient denies any pain, was found lying in bed. Reportedly has not eaten or drank anything in the last 48 hours. He has not had his medications over the last several days. Patient currently denies complaints but seems uncomfortable.   Past Medical History  Diagnosis Date  . Coronary heart disease   . Hyperlipidemia   . HTN (hypertension)   . COPD (chronic obstructive pulmonary disease)   . PVD (peripheral vascular disease)   . Atrial fibrillation     converted to NSR 5/09 on pacerone  . Shortness of breath     on exertion  . Diabetes mellitus without complication   . Arthritis     fingers  . Anemia   . DJD (degenerative joint disease) of hip   . GI bleed     01/2013 while on warfarin with a hemoglobin of 5.8 (INR was 8)  . CHF (congestive heart failure)     EF 50-55%  . Carotid artery occlusion   . Complication of anesthesia   . PONV (postoperative nausea and vomiting)   . Cataracts, bilateral     Hx: of  . Heart murmur   . Pneumonia     Hx: of  . Cancer of kidney   . Skin cancer 12/2013    Rt. ear  . Bladder cancer   . Fall at home Dec. 2015  . Blood transfusion without reported diagnosis     Patient Active Problem List   Diagnosis Date Noted  . Syncope 06/22/2015  . Heat exhaustion 04/04/2015  . Dehydration 04/04/2015  . Discoloration of skin-Bilat. foot 03/01/2015  . Ascites 01/19/2015  . Aftercare following surgery of the circulatory system, [place] 07/07/2014  . Diarrhea 01/12/2014  .  Internal hemorrhoids without mention of complication 65/99/3570  . Encounter for therapeutic drug monitoring 12/14/2013  . Dizziness and giddiness 06/10/2013  . Occlusion and stenosis of carotid artery without mention of cerebral infarction 06/10/2013  . Long term (current) use of anticoagulants 05/25/2013  . Left carotid bruit 05/16/2013  . Hypokalemia 03/29/2013  . AVM (arteriovenous malformation) of colon with hemorrhage 02/23/2013  . Benign neoplasm of colon 02/23/2013  . Blood in stool 02/09/2013  . Personal history of colonic polyps 02/09/2013  . Smoker 11/06/2011  . ALLERGIC RHINITIS 03/14/2008  . Hyperlipidemia 02/11/2008  . ANEMIA 02/11/2008  . Essential hypertension 02/11/2008  . Coronary atherosclerosis 02/11/2008  . Atrial fibrillation 02/11/2008  . PERIPHERAL VASCULAR DISEASE 02/11/2008  . CHRONIC OBSTRUCTIVE PULMONARY DISEASE, SEVERE [place] C 02/11/2008    Past Surgical History  Procedure Laterality Date  . Tonsillectomy      as child  . Colonoscopy N/A 02/23/2013    Procedure: COLONOSCOPY;  Surgeon: Inda Castle, MD;  Location: [facility] END[facility]COPY;  Service: Endoscopy;  Laterality: N/A;  . Esophagogastroduodenoscopy N/A 02/23/2013    Procedure: ESOPHAGOGASTRODUODENOSCOPY (EGD);  Surgeon: Inda Castle, MD;  Location: [facility];  Service: Endoscopy;  Laterality: N/A;  . Hot hemostasis N/A 05/04/2013    Procedure: HOT HEMOSTASIS (ARGON PLASMA COAGULATION/BICAP);  Surgeon: Sandy Salaam  Deatra Ina, MD;  Location: [facility];  Service: Endoscopy;  Laterality: N/A;  . Enteroscopy N/A 05/04/2013    Procedure: ENTEROSCOPY;  Surgeon: Inda Castle, MD;  Location: [facility];  Service: Endoscopy;  Laterality: N/A;  . Bypass graft  1997 ?  Marland Kitchen Coronary artery bypass graft      15-17 yrs  . Joint replacement Right      Hip 10-12 yrs  . Endarterectomy Left 06/15/2013    Procedure: ENDARTERECTOMY CAROTID- LEFT;  Surgeon: Conrad Gallant, MD;  Location: [place];  Service: Vascular;   Laterality: Left;  . Patch angioplasty Left 06/15/2013    Procedure: PATCH ANGIOPLASTY using Vascu-Guard Vascular Patch;  Surgeon: Conrad [place], MD;  Location: [place];  Service: Vascular;  Laterality: Left;  . Kidney surgery  Aug. 2015    Chemo-Bladder   . Cataract extraction, bilateral  2015  . Skin cancer excision Right Feb. 2015    Ear  . Carotid endarterectomy Left 06-15-13    cea  . Eye surgery    . Polypectomy  2014  . Nm myocar multiple w/spect  2013    MILD PERFUSION DEFECT SEEN IN THE MID INFERIOR AND APICAL INFERIOR REGIONS. THIS IS CONSISTANT WITH AN INFARCT/SCAR AND OVERLYING ATTENUATION.   . Cardiac catheterization  2009    EF 45%. 3+ MR.MODERATE PULMONARY HTN WITH PREASSURE AT 15MM. SUBTOTAL OCCLUSION OF THE LEFT MAIN, TOTAL PROXIMAL LAD OCCLUSION, TOTAL PROXIMAL CIR OCC. RCA WITH 95% FOLLOWED BY 99% STENOSIS. PATENT LIMA TO LAD. PATENT GRAFT TO OM1 AND OM2. PATENT GRAFT TO PDA. 60% LEFT RENAL ARTERY STENOSIS.  Marland Kitchen Renal duplex  2014    RRA- NORMAL PATENCY. L MAIN RA- <= 60% DIAMETER REDUCTION. L ACCESSORY RA- NORMAL PATENCY. [place].    Allergies Review of patient's allergies indicates no known allergies.  Social History Social History  Substance Use Topics  . Smoking status: Current Every Day Smoker -- 0.25 packs/day    Types: Cigarettes  . Smokeless tobacco: Never Used     Comment: Started smoking at age 6.  Tobacco info given 01/12/14  . Alcohol Use: 1.0 - 1.5 oz/week    2-3 Standard drinks or equivalent per week     Comment: Beer -haven't drank since Dec 2013    Review of Systems Constitutional: Negative for fever. Eyes: Negative for visual changes. ENT: Negative for sore throat. Cardiovascular: Negative for chest pain. Respiratory: Negative for shortness of breath. Gastrointestinal: Negative for abdominal pain, vomiting and diarrhea. Genitourinary: Negative for dysuria. Musculoskeletal: Negative for back pain. Skin: Positive for  bruising Neurological: Negative for headaches, positive for weakness  10-point ROS otherwise negative.  ____________________________________________   PHYSICAL EXAM:  VITAL SIGNS: ED Triage Vitals  Enc Vitals Group     BP --      Pulse --      Resp --      Temp --      Temp src --      SpO2 --      Weight --      Height --      Head Cir --      Peak Flow --      Pain Score --      Pain Loc --      Pain Edu? --      Excl. in [place]? --     Constitutional: Alert and oriented. Mild distress Eyes: Conjunctivae are normal. PERRL. Normal extraocular movements. ENT   Head: Right-sided  facial ecchymosis   Nose: No congestion/rhinnorhea.   Mouth/Throat: Mucous membranes are moist.   Neck: No stridor. Cardiovascular: Normal rate, regular rhythm. Normal and symmetric distal pulses are present in all extremities. No murmurs, rubs, or gallops. Respiratory: Normal respiratory effort without tachypnea nor retractions. Breath sounds are clear and equal bilaterally. No wheezes/rales/rhonchi. Gastrointestinal: Soft and nontender. No distention. No abdominal bruits.  Musculoskeletal: Decreased range of motion of his extremities, lower extremities appear to be in  flexion contractures. Neurologic:  Normal speech and language. No gross focal neurologic deficits are appreciated. Speech is normal. No gait instability. Patient seems agitated and restless Skin:  Jaundiced skin Psychiatric: Mood and affect are normal. Speech and behavior are normal. Patient exhibits appropriate insight and judgment. ____________________________________________  EKG: Interpreted by me. Sinus tachycardia with first-degree AV block, LVH, ST depression laterally, long QT Rate is 110 beats a minute  ____________________________________________  ED COURSE:  Pertinent labs & imaging results that were available during my care of the patient were reviewed by me and considered in my medical decision making  (see chart for details). Patient with weakness, unclear etiology at this time. We'll need extensive workup and imaging. ____________________________________________    LABS (pertinent positives/negatives)  Labs Reviewed  CBC WITH DIFFERENTIAL/PLATELET - Abnormal; Notable for the following:    WBC 12.0 (*)    RBC 3.25 (*)    Hemoglobin 10.6 (*)    HCT 31.3 (*)    RDW 16.7 (*)    Platelets 88 (*)    Neutro Abs 10.7 (*)    Lymphs Abs 0.5 (*)    All other components within normal limits  COMPREHENSIVE METABOLIC PANEL - Abnormal; Notable for the following:    Sodium 133 (*)    Chloride 96 (*)    BUN 32 (*)    Calcium 8.5 (*)    Total Protein 5.7 (*)    Albumin 2.9 (*)    AST 91 (*)    Alkaline Phosphatase 129 (*)    Total Bilirubin 4.7 (*)    All other components within normal limits  TROPONIN I - Abnormal; Notable for the following:    Troponin I 0.04 (*)    All other components within normal limits  URINALYSIS COMPLETEWITH MICROSCOPIC ([facility] ONLY) - Abnormal; Notable for the following:    Color, Urine AMBER (*)    APPearance CLEAR (*)    All other components within normal limits  CK - Abnormal; Notable for the following:    Total CK 443 (*)    All other components within normal limits  CBG MONITORING, ED    RADIOLOGY Images were viewed by me  Chest x-ray, pelvis x-ray  IMPRESSION: Severe osteopenia with findings suggesting acute fractures of the right superior and inferior pubic rami. IMPRESSION: 1. Fractures of the lateral left seventh and eighth ribs, which appear recent. 2. No acute cardiopulmonary disease. No pneumothorax. IMPRESSION: 1. No evidence for acute abnormality. 2. Atrophy and chronic ischemic changes. 3. Old lacunar infarcts in the basal ganglia bilaterally. ____________________________________________  FINAL ASSESSMENT AND PLAN  Weakness, fall, pelvic fractures, rib fractures, dehydration  Plan: Patient with labs and imaging as dictated  above. Wife states that the patient does not want heroic measures, wants to be DO NOT RESUSCITATE. Wife is okay with him being DO NOT RESUSCITATE. We will designate him as DO NOT RESUSCITATE per the family and patient's wishes. He'll be going to the hospice home for comfort.   Earleen Newport, MD   Roderic Palau  Alain Honey, MD 07/27/15 1431

## 2015-07-27 NOTE — ED Notes (Signed)
Pt arrived by EMS, who was called due to weakness, no appetite, and pt not taking medications, all since 3 days ago. Also reported that patient fell on 3 days ago when symptoms started, but patient walked back to bed after fall and was not evaluated by MD.  Pt on 4L on arrival to ED.  Pt alert and oriented, but EMS reports conflicting information from patient and family.

## 2015-07-27 NOTE — Discharge Instructions (Signed)
Dehydration, Adult Dehydration means your body does not have as much fluid as it needs. Your kidneys, brain, and heart will not work properly without the right amount of fluids and salt.  HOME CARE  Ask your doctor how to replace body fluid losses (rehydrate).  Drink enough fluids to keep your pee (urine) clear or pale yellow.  Drink small amounts of fluids often if you feel sick to your stomach (nauseous) or throw up (vomit).  Eat like you normally do.  Avoid:  Foods or drinks high in sugar.  Bubbly (carbonated) drinks.  Juice.  Very hot or cold fluids.  Drinks with caffeine.  Fatty, greasy foods.  Alcohol.  Tobacco.  Eating too much.  Gelatin desserts.  Wash your hands to avoid spreading germs (bacteria, viruses).  Only take medicine as told by your doctor.  Keep all doctor visits as told. GET HELP RIGHT AWAY IF:   You cannot drink something without throwing up.  You get worse even with treatment.  Your vomit has blood in it or looks greenish.  Your poop (stool) has blood in it or looks black and tarry.  You have not peed in 6 to 8 hours.  You pee a small amount of very dark pee.  You have a fever.  You pass out (faint).  You have belly (abdominal) pain that gets worse or stays in one spot (localizes).  You have a rash, stiff neck, or bad headache.  You get easily annoyed, sleepy, or are hard to wake up.  You feel weak, dizzy, or very thirsty. MAKE SURE YOU:   Understand these instructions.  Will watch your condition.  Will get help right away if you are not doing well or get worse. Document Released: 08/23/2009 Document Revised: 01/19/2012 Document Reviewed: 06/16/2011 [facility] Patient Information 2015 [place], [place]. This information is not intended to replace advice given to you by your health care provider. Make sure you discuss any questions you have with your health care provider.  Stable Pelvic Fracture You have one or more  fractures (this means there is a break in the bones) of the pelvis. The pelvis is the ring of bones that make up your hipbones. These are the bones you sit on and the lower part of the spine. It is like a boney ring where your legs attach and which supports your upper body. You have an undisplaced fracture. This means the bones are in good position. The pelvic fracture you have is a simple (uncomplicated) fracture. DIAGNOSIS  X-rays usually diagnose these fractures. TREATMENT  The goal of treating pelvic fractures is to get the bones to heal in a good position. The patient should return to normal activities as soon as possible. Such fractures are often treated with normal bed rest and conservative measures.  HOME CARE INSTRUCTIONS   You should be on bed rest for as long as directed by your caregiver. Change positions of your legs every 1-2 hours to maintain good blood flow. You may sit as long as is tolerable. Following this, you may do usual activities, but avoid strenuous activities for as long as directed by your caregiver.  Only take over-the-counter or prescription medicines for pain, discomfort, or fever as directed by your caregiver.  Bed rest may also be used for discomfort.  Resume your activities when you are able. Use a cane or crutch on the injured side to reduce pain while walking, as needed.  If you develop increased pain or discomfort not relieved with medications,  contact your caregiver.  Warning: Do not drive a car or operate a motor vehicle until your caregiver specifically tells you it is safe to do so. SEEK IMMEDIATE MEDICAL CARE IF:   You feel light-headed or faint, develop chest pain or shortness of breath.  An unexplained oral temperature above 102 F (38.9 C) develops.  You develop blood in the urine or in the stools.  There is difficulty urinating, and/or having a bowel movement, or pain with these efforts.  There is a difficulty or increased pain with  walking.  There is swelling in one or both legs that is not normal. Document Released: 01/05/2002 Document Revised: 03/13/2014 Document Reviewed: 06/09/2008 [facility] Patient Information 2015 [place], [place]. This information is not intended to replace advice given to you by your health care provider. Make sure you discuss any questions you have with your health care provider.  Rib Fracture A rib fracture is a break or crack in one of the bones of the ribs. The ribs are a group of long, curved bones that wrap around your chest and attach to your spine. They protect your lungs and other organs in the chest cavity. A broken or cracked rib is often painful, but most do not cause other problems. Most rib fractures heal on their own over time. However, rib fractures can be more serious if multiple ribs are broken or if broken ribs move out of place and push against other structures. CAUSES   A direct blow to the chest. For example, this could happen during contact sports, a car accident, or a fall against a hard object.  Repetitive movements with high force, such as pitching a baseball or having severe coughing spells. SYMPTOMS   Pain when you breathe in or cough.  Pain when someone presses on the injured area. DIAGNOSIS  Your caregiver will perform a physical exam. Various imaging tests may be ordered to confirm the diagnosis and to look for related injuries. These tests may include a chest X-ray, computed tomography (CT), magnetic resonance imaging (MRI), or a bone scan. TREATMENT  Rib fractures usually heal on their own in 1-3 months. The longer healing period is often associated with a continued cough or other aggravating activities. During the healing period, pain control is very important. Medication is usually given to control pain. Hospitalization or surgery may be needed for more severe injuries, such as those in which multiple ribs are broken or the ribs have moved out of place.  HOME CARE  INSTRUCTIONS   Avoid strenuous activity and any activities or movements that cause pain. Be careful during activities and avoid bumping the injured rib.  Gradually increase activity as directed by your caregiver.  Only take over-the-counter or prescription medications as directed by your caregiver. Do not take other medications without asking your caregiver first.  Apply ice to the injured area for the first 1-2 days after you have been treated or as directed by your caregiver. Applying ice helps to reduce inflammation and pain.  Put ice in a plastic bag.  Place a towel between your skin and the bag.   Leave the ice on for 15-20 minutes at a time, every 2 hours while you are awake.  Perform deep breathing as directed by your caregiver. This will help prevent pneumonia, which is a common complication of a broken rib. Your caregiver may instruct you to:  Take deep breaths several times a day.  Try to cough several times a day, holding a pillow against the  injured area.  Use a device called an incentive spirometer to practice deep breathing several times a day.  Drink enough fluids to keep your urine clear or pale yellow. This will help you avoid constipation.   Do not wear a rib belt or binder. These restrict breathing, which can lead to pneumonia.  SEEK IMMEDIATE MEDICAL CARE IF:   You have a fever.   You have difficulty breathing or shortness of breath.   You develop a continual cough, or you cough up thick or bloody sputum.  You feel sick to your stomach (nausea), throw up (vomit), or have abdominal pain.   You have worsening pain not controlled with medications.  MAKE SURE YOU:  Understand these instructions.  Will watch your condition.  Will get help right away if you are not doing well or get worse. Document Released: 10/27/2005 Document Revised: 06/29/2013 Document Reviewed: 12/29/2012 [facility] Patient Information 2015 [place], [place]. This information is not  intended to replace advice given to you by your health care provider. Make sure you discuss any questions you have with your health care provider.

## 2015-07-27 NOTE — ED Notes (Signed)
Hospice RN [name] informed RN that report has already been called to [facility] and pt is ready for transport via EMS. EMS contacted and informed, will pick pt up shortly. Pt and family made aware and verbalized understanding at this time.

## 2015-07-27 NOTE — Care Management Note (Signed)
Case Management Note  Patient Details  Name: Jason Larsen MRN: [id number] Date of Birth: [birth year]  Subjective/Objective:  Dr Lenise Herald has spoken to the pt. Wife , and let her know that the pt. Has rib fractures, and pubic ramus fractures. None of which are admission criteria to the hospital. He has asked her if she wanted or expected efforts to prolong the pt. And she has expressed to him the pt. Would want to be DNR. The pt. Already has servcies through [facility]. [name] hospital liasion was here and made familiar with the case. She has told us hospice home has beds available.  Dr Jimmye Norman will document the portable DNR. I have started a packet for transport, and Bill, RN for the pt. Has been made aware.  [name] will let us know whom to call report to, her # is [phone].                  Action/Plan:   Expected Discharge Date:                  Expected Discharge Plan:     In-House Referral:     Discharge planning Services     Post Acute Care Choice:    Choice offered to:     DME Arranged:    DME Agency:     HH Arranged:    [place] Agency:     Status of Service:     Medicare Important Message Given:    Date Medicare IM Given:    Medicare IM give by:    Date Additional Medicare IM Given:    Additional Medicare Important Message give by:     If discussed at [place] of Stay Meetings, dates discussed:    Additional Comments:  Beau Fanny, RN 07/27/2015, 2:38 PM

## 2015-07-27 NOTE — ED Notes (Signed)
Pt with right hip bruising, redness, skin intact. Right lateral foot with two areas red with shin intact. Left medial ankle bruised, red, skin intact. Right shoulder red with top layer skin scraped. Right lateral knee red. Pt laid on right side x 3 days. Assumed pressure caused redness and bruising. Dr. Jimmye Norman aware

## 2015-07-27 NOTE — Progress Notes (Signed)
Patient appears to be at end of life, he has rib and pubic bone fracture, is not eating or taking medications. [facility] [name] notified that hospice home was an option.  Attending physician Dr. Jimmye Norman contacted patient's wife Jason Larsen, who voiced her agreement with change to DNR code and transfer to the hospice home. Writer updated hospice LCSW who then also spoke with patient's wife Jason Larsen to confirm this choice, she voiced her understanding and confirmation of DNR code.  Report called to the hospice home, staff RN Lauren to notify EMS for pick up/transfer to the hospice home with portable DNR in place. Thank you. Flo Shanks RN, BSN, [place] [facility] [place], [facility] [phone] c

## 2015-07-30 ENCOUNTER — Other Ambulatory Visit: Payer: Self-pay | Admitting: Physical Medicine and Rehabilitation

## 2015-07-30 DIAGNOSIS — M545 Low back pain, unspecified: Secondary | ICD-10-CM

## 2015-08-02 ENCOUNTER — Ambulatory Visit: Payer: Commercial Managed Care - HMO

## 2015-08-02 ENCOUNTER — Other Ambulatory Visit: Payer: Self-pay

## 2015-08-02 ENCOUNTER — Ambulatory Visit: Payer: Self-pay

## 2015-08-11 DEATH — deceased

## 2015-09-06 ENCOUNTER — Encounter (HOSPITAL_COMMUNITY): Payer: Commercial Managed Care - HMO

## 2015-09-06 ENCOUNTER — Ambulatory Visit: Payer: Commercial Managed Care - HMO | Admitting: Family

## 2015-11-06 ENCOUNTER — Ambulatory Visit: Payer: Commercial Managed Care - HMO | Admitting: Cardiovascular Disease

## 2016-05-27 IMAGING — CR DG CHEST 1V PORT
1 series · 1 of 1 positions shown · non-contrast
Comparison: Chest radiograph November 04, 2014; chest CT November 04, 2014

CLINICAL DATA: Altered mental status

EXAM:
PORTABLE CHEST - 1 VIEW

[portable]
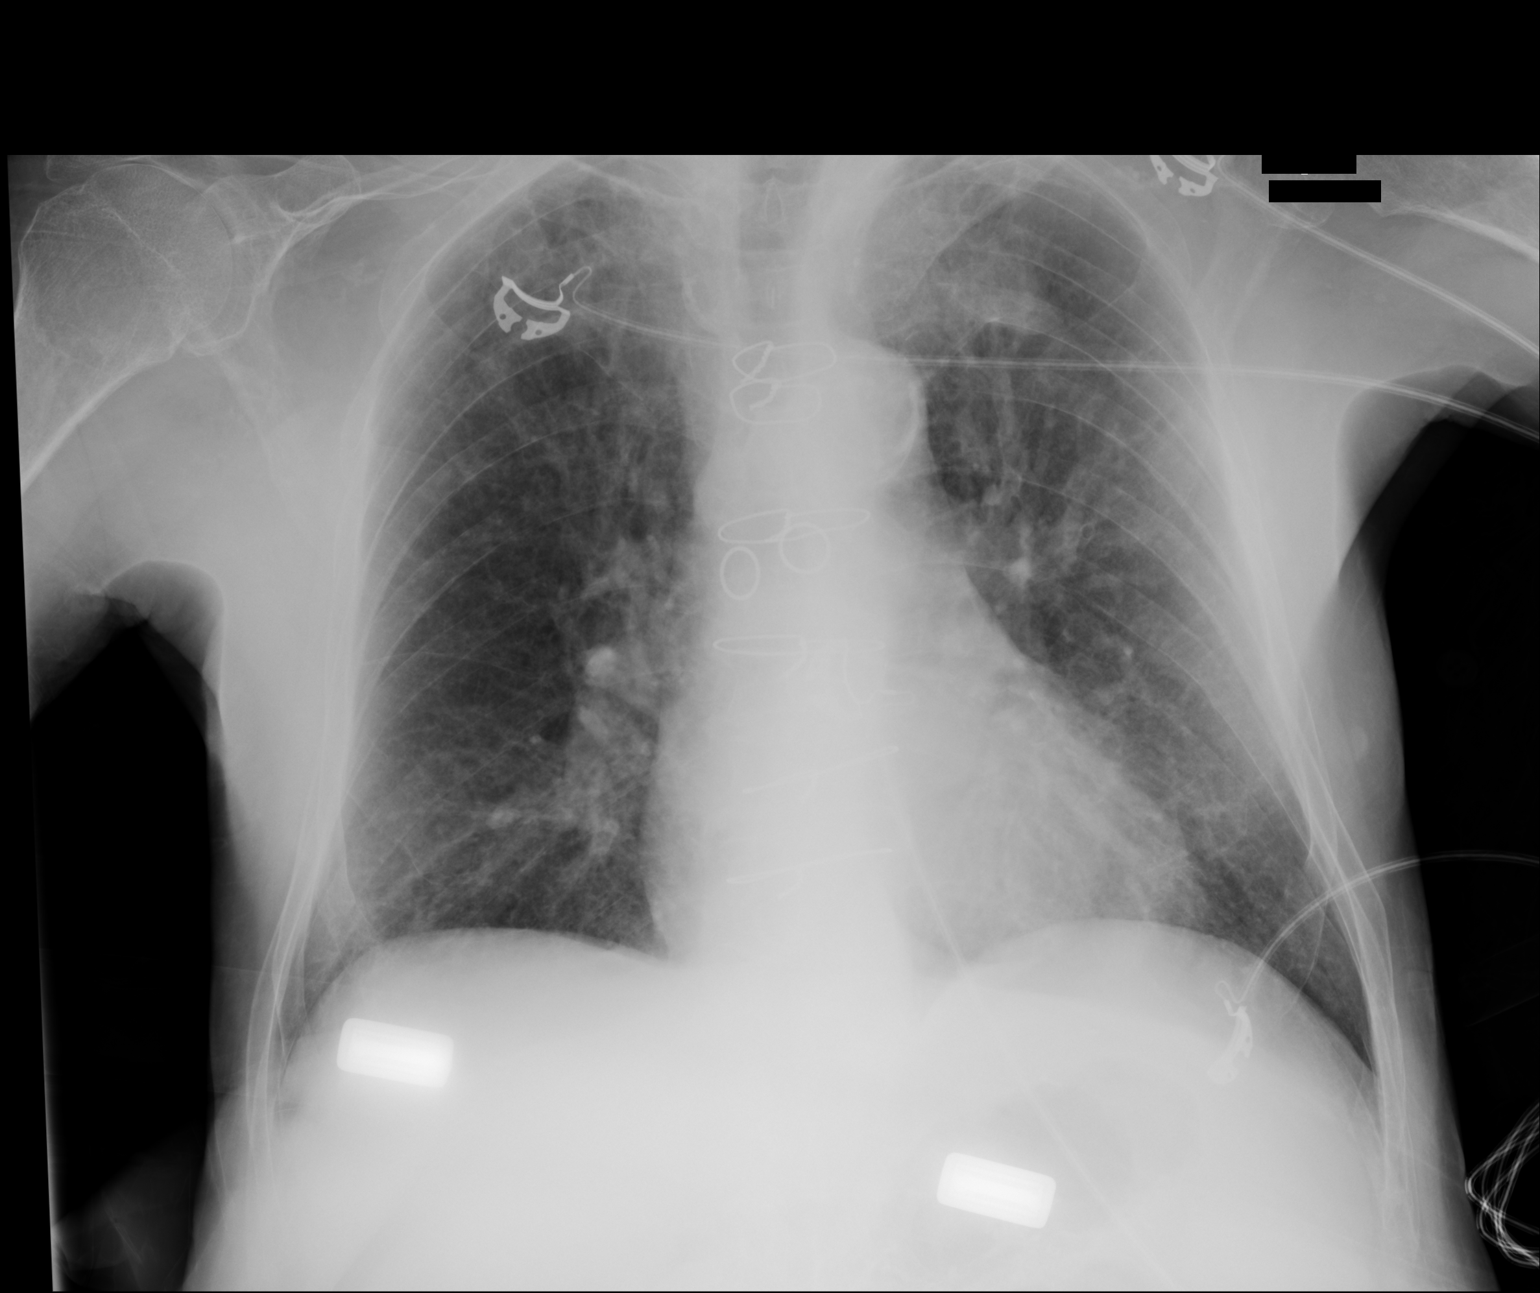

[1 of 1 positions shown; findings below may reference images not displayed]

FINDINGS: There is underlying emphysematous change. Interstitium is mildly
prominent but stable, reflecting chronic inflammatory type change.
In comparison with the previous studies, there is increased opacity
at the level of the anterior left first rib. There is an exostosis
in this area which may be causing this increased opacity. There is
no apparent edema or consolidation.

Heart size and pulmonary vascularity are normal. No adenopathy.
Patient is status post coronary artery bypass grafting. There is an
old healed fracture of the right clavicle. No acute fracture
identified.
IMPRESSION: In comparison with prior studies, there is increased opacity in the
region of the anterior left first rib. This opacity may be due to a
combination of portable technique and exostosis in this area. An
underlying parenchymal lung lesion, however, cannot be excluded on
this study. If patient is clinically able, an apical lordotic chest
radiograph could be helpful to exclude a mass in the left apex. If
patient cannot cooperate for apical lordotic study, noncontrast
enhanced chest CT could also be helpful to further evaluate this
area and would be advised.

No edema or consolidation.  Underlying emphysematous change.

## 2016-05-27 IMAGING — CT CT HEAD W/O CM
2 series · 14 of 30 positions shown, 16 images · non-contrast
Comparison: MRI head 01/11/2015

CLINICAL DATA: Altered mental status.  Unresponsive.  Fever.

EXAM:
CT HEAD WITHOUT CONTRAST
TECHNIQUE: Contiguous axial images were obtained from the base of the skull
through the vertex without intravenous contrast.

[Series 2: head wo · axial · 0.39mm/px · z∈[+385,+490]mm · 6 of 31 slices shown, 8 images]
[im 5/31  brain]
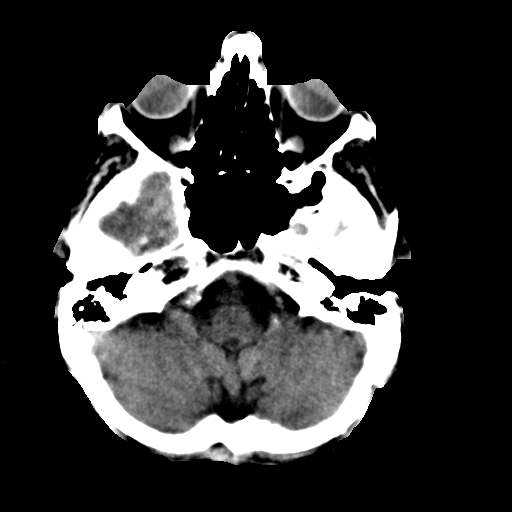
[im 5/31  bone]
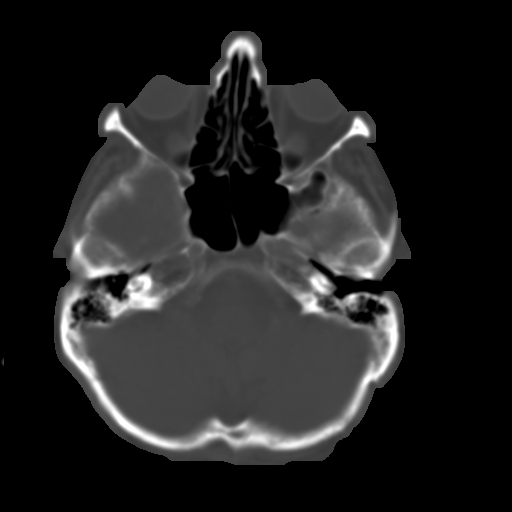
[im 9/31  brain]
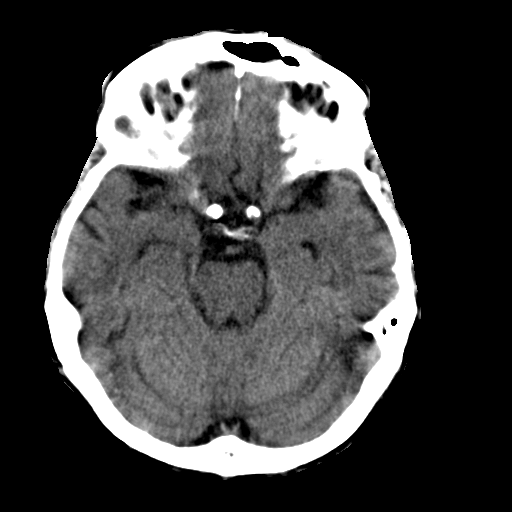
[im 13/31  brain]
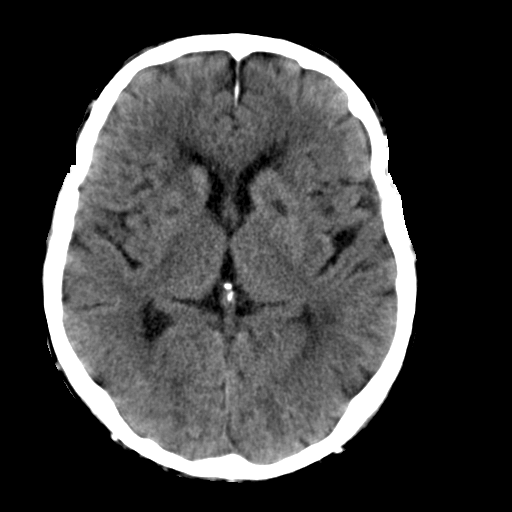
[im 18/31  brain]
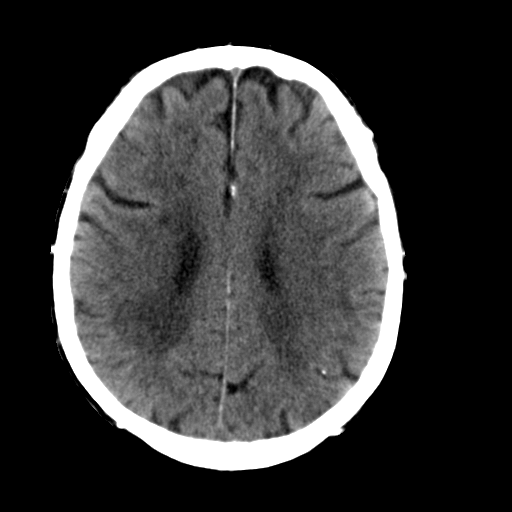
[im 22/31  brain]
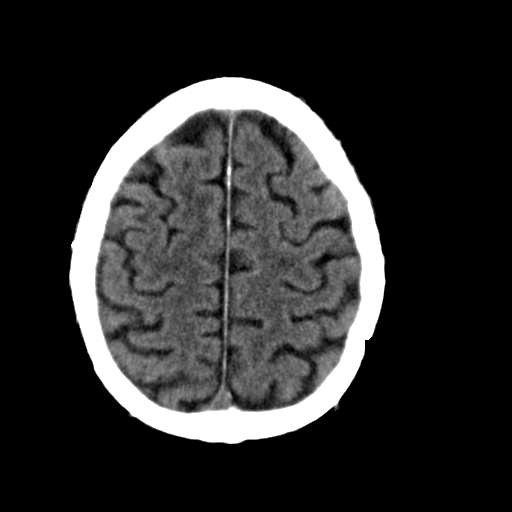
[im 22/31  bone]
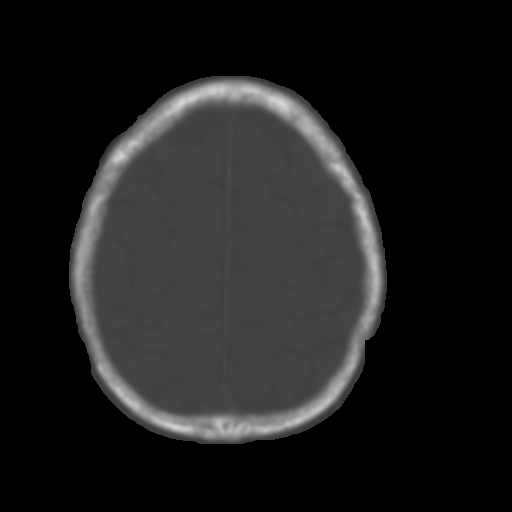
[im 26/31  brain]
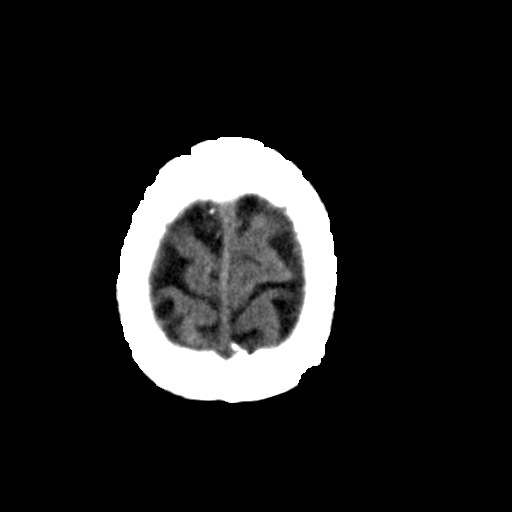

[Series 3: head bone · axial · 0.39mm/px · z∈[+371,+507]mm · 8 of 86 slices shown]
[im 9/86  bone]
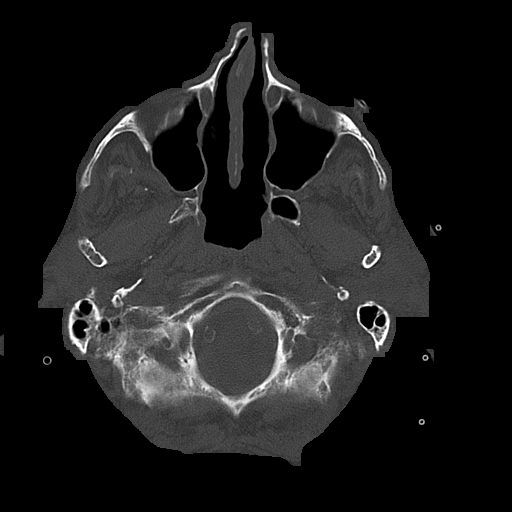
[im 17/86  bone]
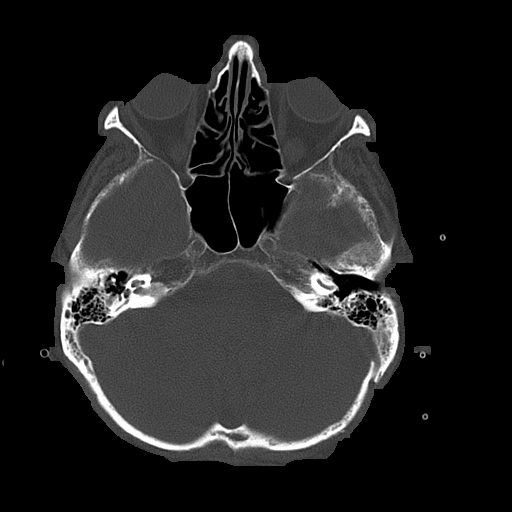
[im 29/86  bone]
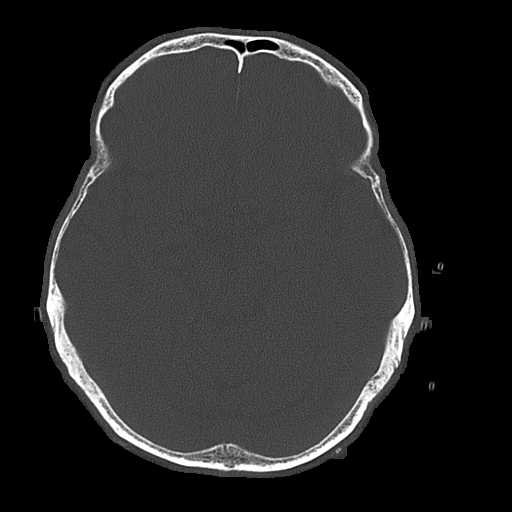
[im 37/86  bone]
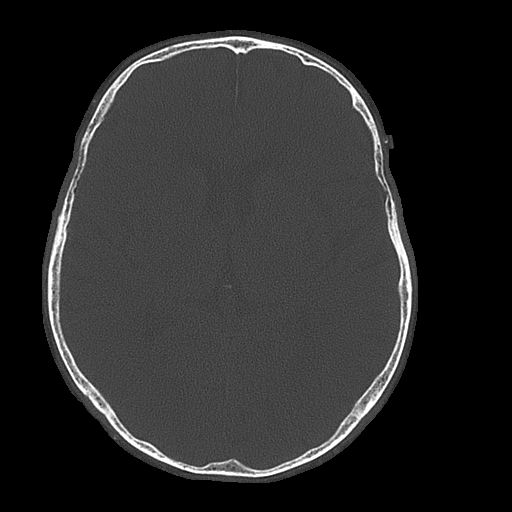
[im 49/86  bone]
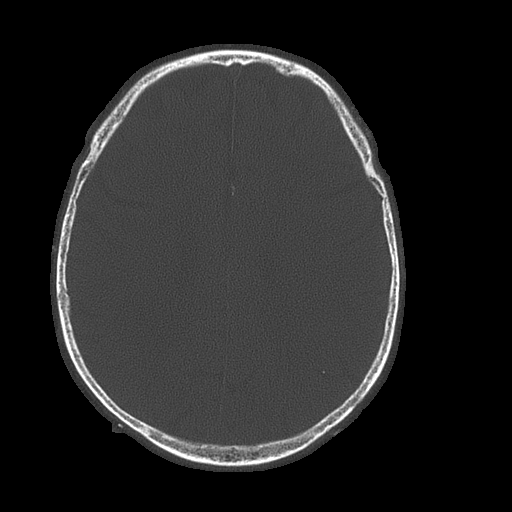
[im 57/86  bone]
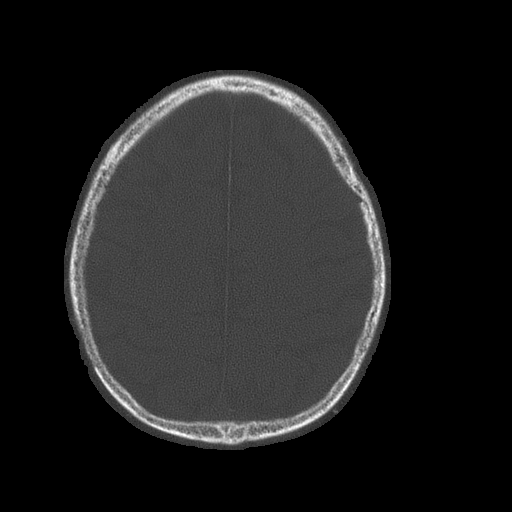
[im 69/86  bone]
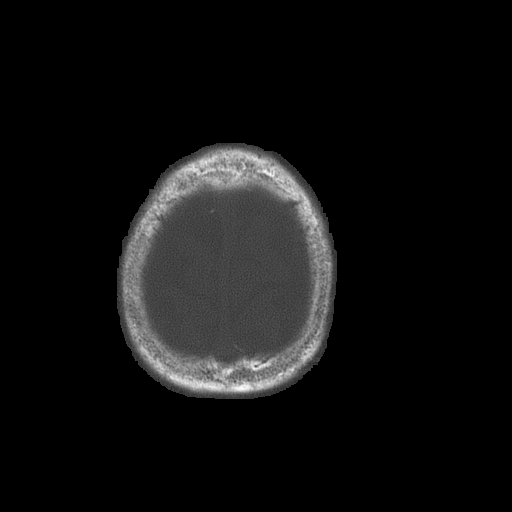
[im 77/86  bone]
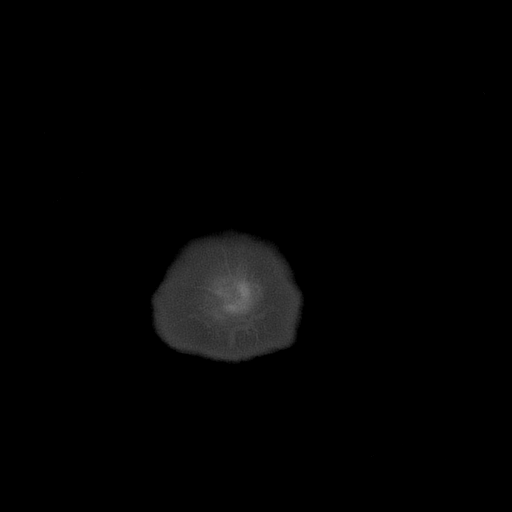

[14 of 30 positions shown; findings below may reference images not displayed]

FINDINGS: Mild atrophy.  Negative for hydrocephalus

Moderate chronic microvascular ischemic change throughout the
cerebral white matter. Chronic infarcts in the putamen bilaterally.
Chronic infarct right internal capsule anteriorly.

Negative for acute infarct.  Negative for hemorrhage or mass.

Atherosclerotic calcification in the vertebral arteries and carotid
arteries.
IMPRESSION: Atrophy and chronic microvascular ischemia.  No acute abnormality.

## 2016-08-14 IMAGING — CT CT HEAD W/O CM
2 series · 14 of 30 positions shown, 16 images · non-contrast
Comparison: 04/04/2015

CLINICAL DATA: Unwitnessed fall

EXAM:
CT HEAD WITHOUT CONTRAST
TECHNIQUE: Contiguous axial images were obtained from the base of the skull
through the vertex without intravenous contrast.

[Series 2: head wo · axial · 0.42mm/px · z∈[-162,-56]mm · 6 of 31 slices shown, 8 images]
[im 5/31  brain]
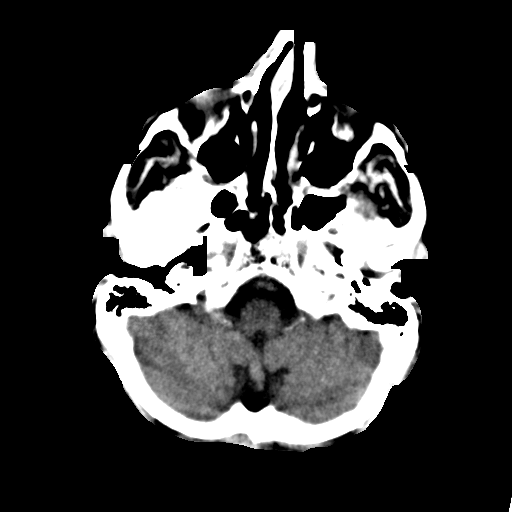
[im 5/31  bone]
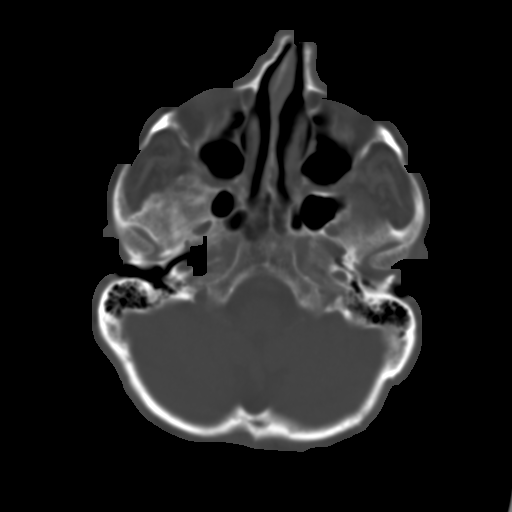
[im 9/31  brain]
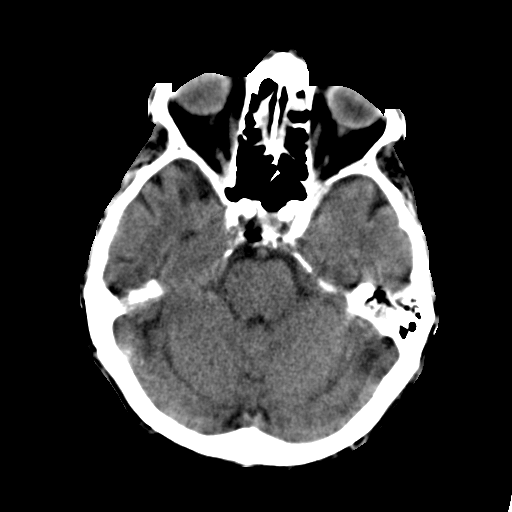
[im 13/31  brain]
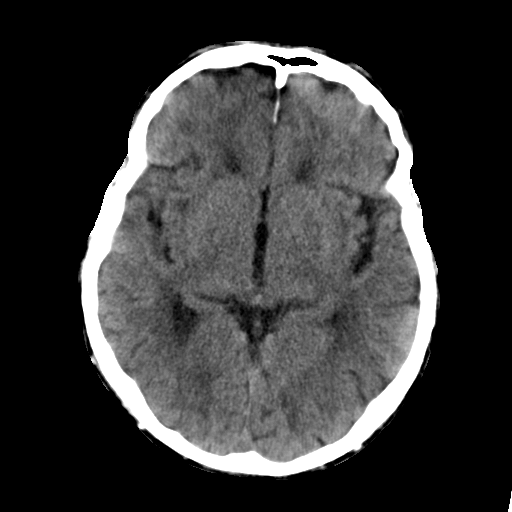
[im 18/31  brain]
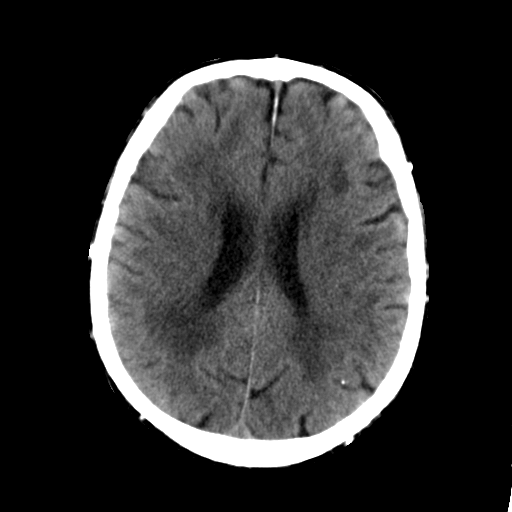
[im 22/31  brain]
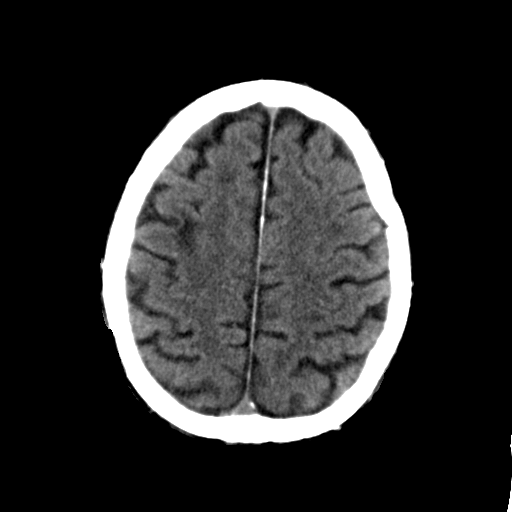
[im 22/31  bone]
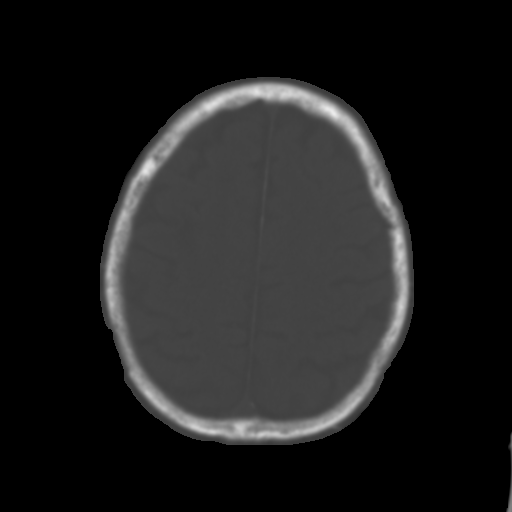
[im 26/31  brain]
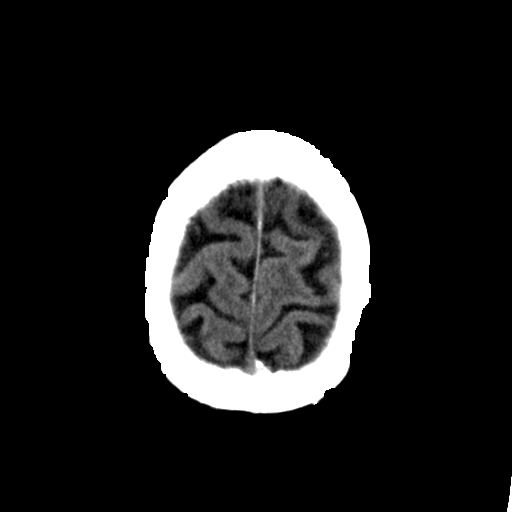

[Series 3: head bone · axial · 0.42mm/px · z∈[-176,-40]mm · 8 of 86 slices shown]
[im 9/86  bone]
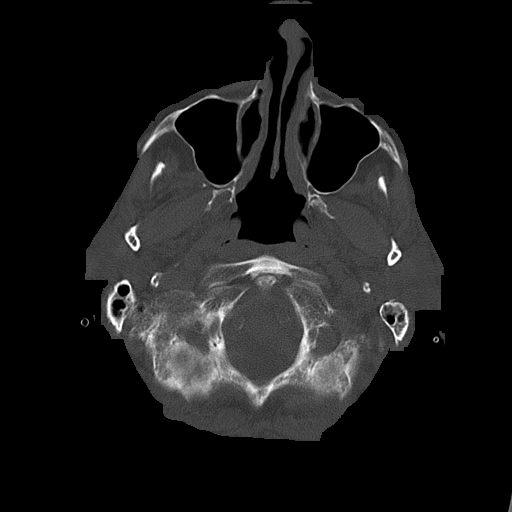
[im 17/86  bone]
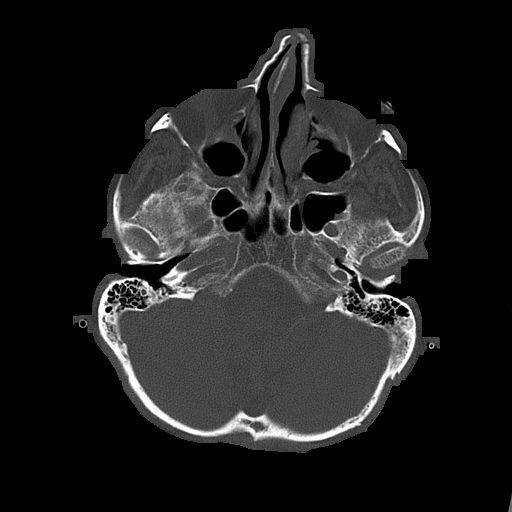
[im 29/86  bone]
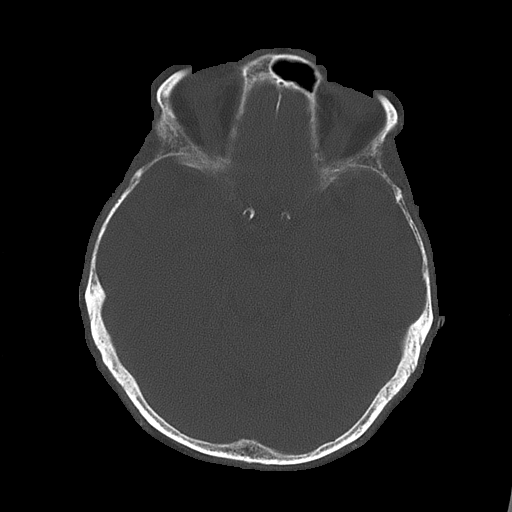
[im 37/86  bone]
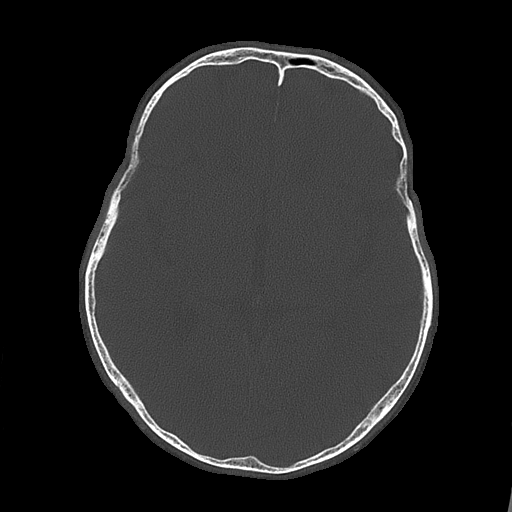
[im 49/86  bone]
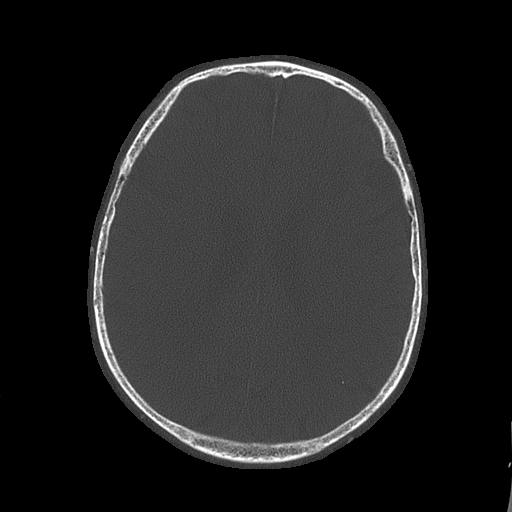
[im 57/86  bone]
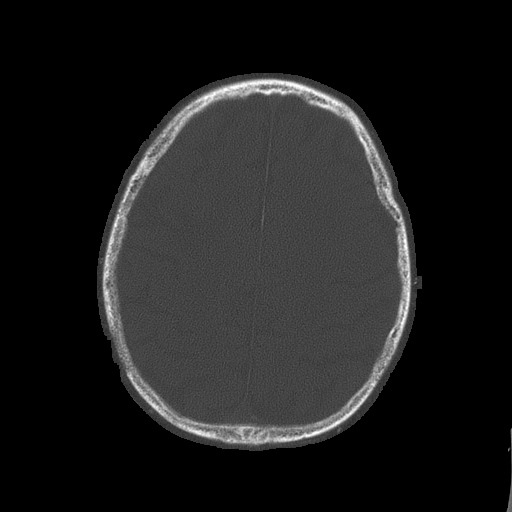
[im 69/86  bone]
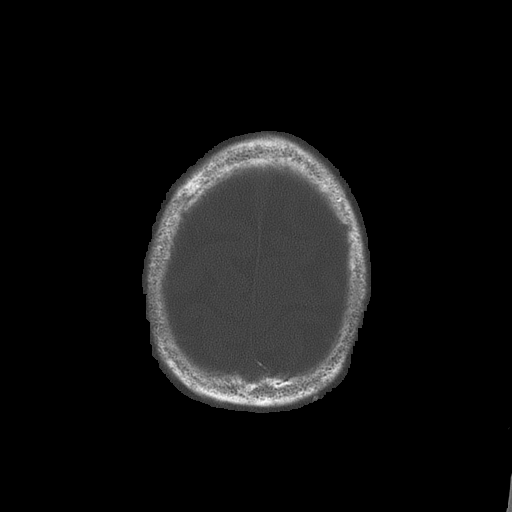
[im 77/86  bone]
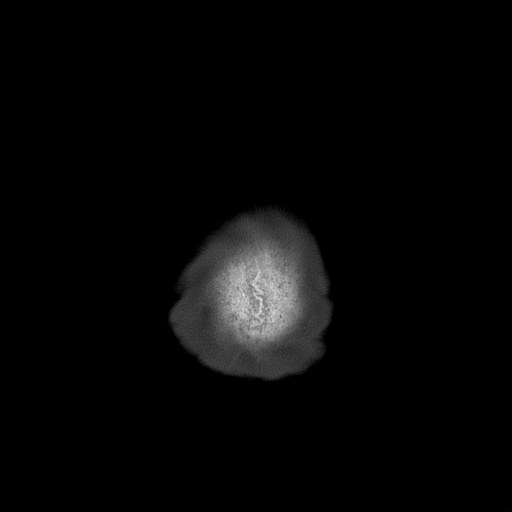

[14 of 30 positions shown; findings below may reference images not displayed]

FINDINGS: No evidence of parenchymal hemorrhage or extra-axial fluid
collection. No mass lesion, mass effect, or midline shift.

No CT evidence of acute infarction. Bilateral basal ganglia lacunar
infarcts.

Subcortical white matter and periventricular small vessel ischemic
changes. Intracranial atherosclerosis.

Partial opacification of the left ethmoid sinus. Mastoid air cells
are clear.

Cutaneous thickening/irregularity along the anterior nose (series
2/image 3).

No evidence of calvarial fracture.
IMPRESSION: No evidence of acute intracranial abnormality.

Bilateral basal ganglia lacunar infarcts. Atrophy with small vessel
ischemic changes.

Cutaneous thickening/irregularity along the anterior nose. Correlate
with visual inspection to exclude cutaneous malignancy.
